# Patient Record
Sex: Female | Born: 1970 | Race: White | Hispanic: No | Marital: Married | Smoking: Never smoker
Health system: Southern US, Community
[De-identification: ages and names within clinical notes are randomized; demographics above are authoritative.]

## PROBLEM LIST (undated history)

## (undated) DIAGNOSIS — M1990 Unspecified osteoarthritis, unspecified site: Secondary | ICD-10-CM

## (undated) DIAGNOSIS — E559 Vitamin D deficiency, unspecified: Secondary | ICD-10-CM

## (undated) DIAGNOSIS — F5104 Psychophysiologic insomnia: Secondary | ICD-10-CM

## (undated) DIAGNOSIS — R931 Abnormal findings on diagnostic imaging of heart and coronary circulation: Secondary | ICD-10-CM

## (undated) DIAGNOSIS — E039 Hypothyroidism, unspecified: Secondary | ICD-10-CM

## (undated) DIAGNOSIS — T7840XA Allergy, unspecified, initial encounter: Secondary | ICD-10-CM

## (undated) DIAGNOSIS — F4310 Post-traumatic stress disorder, unspecified: Secondary | ICD-10-CM

## (undated) DIAGNOSIS — R51 Headache: Secondary | ICD-10-CM

## (undated) DIAGNOSIS — F32A Depression, unspecified: Secondary | ICD-10-CM

## (undated) HISTORY — DX: Post-traumatic stress disorder, unspecified: F43.10

## (undated) HISTORY — DX: Depression, unspecified: F32.A

## (undated) HISTORY — DX: Hypothyroidism, unspecified: E03.9

## (undated) HISTORY — DX: Unspecified osteoarthritis, unspecified site: M19.90

## (undated) HISTORY — PX: REFRACTIVE SURGERY: SHX103

## (undated) HISTORY — DX: Allergy, unspecified, initial encounter: T78.40XA

## (undated) HISTORY — DX: Headache: R51

## (undated) HISTORY — DX: Psychophysiologic insomnia: F51.04

## (undated) HISTORY — PX: WISDOM TOOTH EXTRACTION: SHX21

## (undated) HISTORY — DX: Vitamin D deficiency, unspecified: E55.9

---

## 2005-02-25 ENCOUNTER — Encounter: Admission: RE | Admit: 2005-02-25 | Discharge: 2005-02-25 | Payer: Self-pay | Admitting: *Deleted

## 2005-12-28 ENCOUNTER — Encounter: Admission: RE | Admit: 2005-12-28 | Discharge: 2005-12-28 | Payer: Self-pay | Admitting: *Deleted

## 2006-08-07 ENCOUNTER — Encounter: Payer: Self-pay | Admitting: Internal Medicine

## 2007-06-28 ENCOUNTER — Ambulatory Visit: Payer: Self-pay | Admitting: Internal Medicine

## 2007-07-23 ENCOUNTER — Ambulatory Visit: Payer: Self-pay | Admitting: Internal Medicine

## 2007-07-25 ENCOUNTER — Encounter: Payer: Self-pay | Admitting: Internal Medicine

## 2007-07-25 DIAGNOSIS — G4700 Insomnia, unspecified: Secondary | ICD-10-CM

## 2007-07-25 DIAGNOSIS — R519 Headache, unspecified: Secondary | ICD-10-CM | POA: Insufficient documentation

## 2007-07-25 DIAGNOSIS — F4310 Post-traumatic stress disorder, unspecified: Secondary | ICD-10-CM | POA: Insufficient documentation

## 2007-07-25 DIAGNOSIS — IMO0002 Reserved for concepts with insufficient information to code with codable children: Secondary | ICD-10-CM

## 2007-07-25 DIAGNOSIS — R51 Headache: Secondary | ICD-10-CM

## 2007-10-16 ENCOUNTER — Ambulatory Visit: Payer: Self-pay | Admitting: Internal Medicine

## 2007-10-16 DIAGNOSIS — J069 Acute upper respiratory infection, unspecified: Secondary | ICD-10-CM | POA: Insufficient documentation

## 2007-11-14 ENCOUNTER — Ambulatory Visit: Payer: Self-pay | Admitting: *Deleted

## 2007-11-21 ENCOUNTER — Ambulatory Visit: Payer: Self-pay | Admitting: *Deleted

## 2007-11-28 ENCOUNTER — Ambulatory Visit: Payer: Self-pay | Admitting: *Deleted

## 2007-12-12 ENCOUNTER — Ambulatory Visit: Payer: Self-pay | Admitting: *Deleted

## 2008-08-08 ENCOUNTER — Inpatient Hospital Stay (HOSPITAL_COMMUNITY): Admission: AD | Admit: 2008-08-08 | Discharge: 2008-08-10 | Payer: Self-pay | Admitting: Obstetrics

## 2008-11-07 HISTORY — PX: REFRACTIVE SURGERY: SHX103

## 2008-12-16 ENCOUNTER — Ambulatory Visit: Payer: Self-pay | Admitting: Internal Medicine

## 2008-12-16 DIAGNOSIS — J45998 Other asthma: Secondary | ICD-10-CM | POA: Insufficient documentation

## 2008-12-16 DIAGNOSIS — J019 Acute sinusitis, unspecified: Secondary | ICD-10-CM | POA: Insufficient documentation

## 2009-03-30 ENCOUNTER — Telehealth (INDEPENDENT_AMBULATORY_CARE_PROVIDER_SITE_OTHER): Payer: Self-pay | Admitting: *Deleted

## 2009-04-07 ENCOUNTER — Encounter: Payer: Self-pay | Admitting: Internal Medicine

## 2009-06-18 ENCOUNTER — Telehealth: Payer: Self-pay | Admitting: Internal Medicine

## 2009-08-13 ENCOUNTER — Telehealth: Payer: Self-pay | Admitting: Internal Medicine

## 2009-09-12 ENCOUNTER — Telehealth (INDEPENDENT_AMBULATORY_CARE_PROVIDER_SITE_OTHER): Payer: Self-pay | Admitting: *Deleted

## 2009-09-18 ENCOUNTER — Telehealth: Payer: Self-pay | Admitting: Internal Medicine

## 2009-10-06 ENCOUNTER — Ambulatory Visit: Payer: Self-pay | Admitting: Internal Medicine

## 2009-10-06 DIAGNOSIS — M79609 Pain in unspecified limb: Secondary | ICD-10-CM

## 2009-10-07 ENCOUNTER — Ambulatory Visit: Payer: Self-pay | Admitting: Internal Medicine

## 2009-12-21 ENCOUNTER — Telehealth: Payer: Self-pay | Admitting: Internal Medicine

## 2010-01-02 ENCOUNTER — Ambulatory Visit: Payer: Self-pay | Admitting: Internal Medicine

## 2010-01-02 DIAGNOSIS — J040 Acute laryngitis: Secondary | ICD-10-CM | POA: Insufficient documentation

## 2010-01-04 ENCOUNTER — Telehealth: Payer: Self-pay | Admitting: Internal Medicine

## 2010-01-14 ENCOUNTER — Telehealth (INDEPENDENT_AMBULATORY_CARE_PROVIDER_SITE_OTHER): Payer: Self-pay | Admitting: *Deleted

## 2010-03-01 ENCOUNTER — Ambulatory Visit: Payer: Self-pay | Admitting: Internal Medicine

## 2010-03-01 DIAGNOSIS — R0989 Other specified symptoms and signs involving the circulatory and respiratory systems: Secondary | ICD-10-CM

## 2010-03-01 DIAGNOSIS — J302 Other seasonal allergic rhinitis: Secondary | ICD-10-CM

## 2010-03-01 DIAGNOSIS — R0609 Other forms of dyspnea: Secondary | ICD-10-CM

## 2010-03-01 DIAGNOSIS — J3089 Other allergic rhinitis: Secondary | ICD-10-CM

## 2010-03-05 ENCOUNTER — Telehealth: Payer: Self-pay | Admitting: Internal Medicine

## 2010-03-11 ENCOUNTER — Telehealth: Payer: Self-pay | Admitting: Internal Medicine

## 2010-03-19 ENCOUNTER — Telehealth: Payer: Self-pay | Admitting: Internal Medicine

## 2010-05-19 ENCOUNTER — Telehealth: Payer: Self-pay | Admitting: Internal Medicine

## 2010-05-21 ENCOUNTER — Encounter: Payer: Self-pay | Admitting: Internal Medicine

## 2010-06-17 ENCOUNTER — Telehealth: Payer: Self-pay | Admitting: Internal Medicine

## 2010-07-02 ENCOUNTER — Telehealth: Payer: Self-pay | Admitting: Internal Medicine

## 2010-07-19 ENCOUNTER — Telehealth: Payer: Self-pay | Admitting: Internal Medicine

## 2010-09-18 ENCOUNTER — Ambulatory Visit: Payer: Self-pay | Admitting: Family Medicine

## 2010-09-20 ENCOUNTER — Telehealth: Payer: Self-pay | Admitting: Internal Medicine

## 2010-11-15 ENCOUNTER — Telehealth: Payer: Self-pay | Admitting: Internal Medicine

## 2010-11-27 ENCOUNTER — Encounter: Payer: Self-pay | Admitting: *Deleted

## 2010-12-06 ENCOUNTER — Telehealth (INDEPENDENT_AMBULATORY_CARE_PROVIDER_SITE_OTHER): Payer: Self-pay | Admitting: *Deleted

## 2010-12-07 NOTE — Progress Notes (Signed)
  Phone Note Call from Patient Call back at Home Phone [phone]   Caller: Patient Call For: [name] Summary of Call: Pt has been exposed to shingles & hep b. Pt wants to know if she should go ahead and get the shingles shot & hep b shot? Please advise. Initial call taken by: Verdell Face,  July 02, 2010 11:51 AM  Follow-up for Phone Call        Per pt, her Mother has had shingles outbreak x 1 week. Pt has had limited contact (hugs, spending time in the same room with pt) but is concerned of exposure. Pt's father has tested pos for Heb B x 2 years (contact same). I advised pt that I would request MD's advisement due to her level of concern. Please advise? Follow-up by: Margaret Pyle, CMA,  July 02, 2010 11:59 AM  Additional Follow-up for Phone Call Additional follow up Details #1::        no need for shingles shot based on this history, especially if she has already had chickenpox in the past (a blood test could be done to prove this)  OK the hepB series , but remember this is technically an STD, only able to be contracted sexually - does she really  think this could be true? if not, perhaps a blood test could be done to prove seronegativity, and IF she so desires, she could have the Hep B series Additional Follow-up by: Corwin Levins MD,  July 02, 2010 1:19 PM    Additional Follow-up for Phone Call Additional follow up Details #2::    Pt informed and declines blood test at this time Follow-up by: Margaret Pyle, CMA,  July 02, 2010 2:08 PM

## 2010-12-07 NOTE — Progress Notes (Signed)
Summary: call report  Phone Note Other Incoming   Caller: [facility] Call Report Summary of Call: Triage Call Report Triage Record Num: [id number] Operator: Geanie Berlin Patient Name: Laurie Solomon Call Date & Time: 01/02/2010 11:04:21AM Patient Phone: [phone] PCP: Oliver Barre Patient Gender: Female PCP Fax : [phone] Patient DOB: Mar 15, 1971 [facility] Name[facility] Reason for Call: EMERGENT CALL: 40 yo Calling for "bad" cold, cough and intermittent sore throat. Onset: 12/26/09. Afebrile. Declined traige. Transferred to office/[place] for appt. Protocol(s) Used: PCP Calls, No Triage (Adult) Recommended Outcome per Protocol: Call Provider within 72 Hours Override Outcome if Used in Protocol: See Provider Immediately RN Reason for Override Outcome: Office Is Open. Reason for Outcome: Caller requesting an appointment, triage offered and declined Care Advice:  ~  Initial call taken by: Margaret Pyle, CMA,  January 04, 2010 8:08 AM  Follow-up for Phone Call        noted -  Follow-up by: Corwin Levins MD,  January 04, 2010 9:59 AM

## 2010-12-07 NOTE — Progress Notes (Signed)
Summary: Rx change?  Phone Note Call from Patient Call back at [facility] Phone [phone]   Caller: Patient Summary of Call: Pt called requesting to have Ambien changed back to Klonopin for as needed sleep. Pt states Klonopin worked much better at keeping her asleep longer. Please advise? Initial call taken by: Margaret Pyle, CMA,  July 19, 2010 1:35 PM  Follow-up for Phone Call        ok  - done hardcopy to LIM side B - dahlia  Follow-up by: Corwin Levins MD,  July 19, 2010 5:26 PM  Additional Follow-up for Phone Call Additional follow up Details #1::        Notified pt rx ready for pick-up. Put in cabinet up front Additional Follow-up by: Orlan Leavens RMA,  July 20, 2010 9:00 AM    New/Updated Medications: CLONAZEPAM 1 MG TABS (CLONAZEPAM) 1po at bedtime as needed Prescriptions: CLONAZEPAM 1 MG TABS (CLONAZEPAM) 1po at bedtime as needed  #30 x 3   Entered and Authorized by:   Corwin Levins MD   Signed by:   Corwin Levins MD on 07/19/2010   Method used:   Print then Give to Patient   RxID:   [id number]

## 2010-12-07 NOTE — Progress Notes (Signed)
Summary: AMBIEN  Phone Note Call from Patient Call back at Home Phone [phone]   Summary of Call: Pt is packing to leave for the weekend and can not find her ambien. She thinks she may have thrown medication away. Patient is requesting rx to be called in to pharmacy. Please advise.  Initial call taken by: Lamar Sprinkles, CMA,  Mar 19, 2010 12:29 PM  Follow-up for Phone Call        remember this was changed apr 25 to the clonazepam;  I would cont the clonazapem alone for now Follow-up by: Corwin Levins MD,  Mar 19, 2010 2:53 PM  Additional Follow-up for Phone Call Additional follow up Details #1::        Left message for pt to return my call. Additional Follow-up by: Josph Macho RMA,  Mar 19, 2010 3:08 PM    Additional Follow-up for Phone Call Additional follow up Details #2::    Patient left message on triage that spouse found her Ambien. No longer needed. Thanks. Follow-up by: Lucious Groves,  Mar 19, 2010 3:55 PM  Additional Follow-up for Phone Call Additional follow up Details #3:: Details for Additional Follow-up Action Taken: noted Additional Follow-up by: Corwin Levins MD,  Mar 19, 2010 4:46 PM

## 2010-12-07 NOTE — Progress Notes (Signed)
Summary: cough syrup  Phone Note Call from Patient Call back at Home Phone [phone]   Caller: Patient Summary of Call: pt called stating that she still has severe cough. pt is requesting cough syrup. Initial call taken by: Margaret Pyle, CMA,  March 05, 2010 3:39 PM  Follow-up for Phone Call        [facility] [address] per pt request Follow-up by: Margaret Pyle, CMA,  March 05, 2010 4:39 PM    Prescriptions: HYDROMET 5-1.5 MG/5ML SYRP (HYDROCODONE-HOMATROPINE) 1-2 tsp by mouth q 4-6 hour as needed cough  #6oz x 1   Entered and Authorized by:   Corwin Levins MD   Signed by:   Corwin Levins MD on 03/05/2010   Method used:   Print then Give to Patient   RxID:   [id number]  done hardcopy to LIM side B - dahlia  Corwin Levins MD  March 05, 2010 4:35 PM

## 2010-12-07 NOTE — Assessment & Plan Note (Signed)
Summary: SORE THROAT,COUGH,COLD/PN   Vital Signs:  Patient profile:   40 year old female Weight:      173 pounds BMI:     27.19 O2 Sat:      98 % Temp:     97.2 degrees F Pulse rate:   88 / minute BP sitting:   140 / 92  Vitals Entered By: Lamar Sprinkles, CMA (January 02, 2010 1:56 PM) CC: Pt c/o sore throat, nonproductive cough since monday. Has taken claritin and sudafed.    History of Present Illness: Laurie Solomon [facility]   5 days of above symptom with hoarseness   and ur congestion.   has small child at home and well,Hx of asthma but no recent flares.  Some  sinus drainage  but no fever cp sob .  Hard to talk currently.    On antihistamine and singulair   out of inhaler .     Preventive Screening-Counseling & Management  Alcohol-Tobacco     Smoking Status: never  Current Medications (verified): 1)  Zolpidem Tartrate 10 Mg Tabs (Zolpidem Tartrate) .Marland Kitchen.. 1 By Mouth At Bedtime As Needed 2)  Alprazolam 0.25 Mg Tabs (Alprazolam) .Marland Kitchen.. 1po Once Daily As Needed - To Fill Dec 21, 2009 3)  Singulair 10 Mg Tabs (Montelukast Sodium) .Marland Kitchen.. 1 By Mouth Once Daily 4)  Phentermine Hcl 37.5 Mg Tabs (Phentermine Hcl) .Marland Kitchen.. 1 By Mouth Once Daily  Allergies: No Known Drug Allergies  Past History:  Past medical, surgical, family and social histories (including risk factors) reviewed for relevance to current acute and chronic problems.  Past Medical History: Reviewed history from 12/16/2008 and no changes required. Chronic Insomnia Hx of Post Traumatic Stress Disorder Headache Asthma - childhood  Past Surgical History: Reviewed history from 07/25/2007 and no changes required. Denies surgical history  Family History: Reviewed history from 12/16/2008 and no changes required. m-grandfather with heart disease, HTN p-grandfather with DM maternal cousin  and great -aunt with breast cancer  Social History: Reviewed history from 12/16/2008 and no changes  required. work - part Curator Alcohol use-no Married 1 son  18 mos   Never Smoked  Review of Systems       The patient complains of hoarseness.  The patient denies anorexia, fever, weight loss, weight gain, chest pain, syncope, dyspnea on exertion, prolonged cough, headaches, enlarged lymph nodes, and angioedema.         no v d   Physical Exam  General:  hoarse in nad  Head:  normocephalic and atraumatic.   Eyes:  vision grossly intact, pupils equal, and pupils round.   Ears:  R ear normal and L ear normal.   Nose:  mild congestion Mouth:  mild erythema no edema or leaions Neck:  No deformities, masses, or tenderness noted. Lungs:  Normal respiratory effort, chest expands symmetrically. Lungs are clear to auscultation, no crackles or wheezes.no dullness.   Heart:  Normal rate and regular rhythm. S1 and S2 normal without gallop, murmur, click, rub or other extra sounds. Pulses:  nl cap refill  Extremities:  no clubbing cyanosis or edema  Neurologic:  non focal  Skin:  turgor normal, color normal, no ecchymoses, and no petechiae.   Cervical Nodes:  shoddy nodes  Psych:  Oriented X3, good eye contact, not anxious appearing, and not depressed appearing.     Impression & Recommendations:  Problem # 1:  LARYNGITIS, ACUTE (ICD-464.00) viral  rx  Expectant management  and monitor for alarm.sx   Problem # 2:  ASTHMA (ICD-493.90) seems stable  at present but get  resucue med if needed    The following medications were removed from the medication list:    Prednisone 10 Mg Tabs (Prednisone) .Marland KitchenMarland KitchenMarland KitchenMarland Kitchen 4po qd for 3days, then 3po qd for 3days, then 2po qd for 3days, then 1po qd for 3 days, then stop Her updated medication list for this problem includes:    Singulair 10 Mg Tabs (Montelukast sodium) .Marland Kitchen... 1 by mouth once daily    Ventolin Hfa 108 (90 Base) Mcg/act Aers (Albuterol sulfate) .Marland Kitchen... 1-2 puffs q id as needed wheezing  Complete Medication List: 1)  Zolpidem Tartrate 10 Mg  Tabs (Zolpidem tartrate) .Marland Kitchen.. 1 by mouth at bedtime as needed 2)  Alprazolam 0.25 Mg Tabs (Alprazolam) .Marland Kitchen.. 1po once daily as needed - to fill Dec 21, 2009 3)  Singulair 10 Mg Tabs (Montelukast sodium) .Marland Kitchen.. 1 by mouth once daily 4)  Phentermine Hcl 37.5 Mg Tabs (Phentermine hcl) .Marland Kitchen.. 1 by mouth once daily 5)  Hydromet 5-1.5 Mg/59ml Syrp (Hydrocodone-homatropine) .Marland Kitchen.. 1-2 tsp by mouth q 4-6 hour as needed cough 6)  Ventolin Hfa 108 (90 Base) Mcg/act Aers (Albuterol sulfate) .Marland Kitchen.. 1-2 puffs q id as needed wheezing  Patient Instructions: 1)  i think this is a viral respiratory infection. 2)  symptom treatment as we discussed. cough could get worse before better. 3)  call if fever shortness of breath  etc. expect symptom for another week or so Prescriptions: VENTOLIN HFA 108 (90 BASE) MCG/ACT AERS (ALBUTEROL SULFATE) 1-2 puffs q id as needed wheezing  #1 x 0   Entered and Authorized by:   Madelin Headings MD   Signed by:   Madelin Headings MD on 01/02/2010   Method used:   [facility]  [address] [phone]* (retail)       [address]       [place], [place]  [id number]       Ph: [phone] or [phone]       Fax: [phone]   RxID:   [id number] HYDROMET 5-1.5 MG/5ML SYRP (HYDROCODONE-HOMATROPINE) 1-2 tsp by mouth q 4-6 hour as needed cough  #6 oz x 0   Entered and Authorized by:   Madelin Headings MD   Signed by:   Madelin Headings MD on 01/02/2010   Method used:   Print then Give to Patient   RxID:   [id number]

## 2010-12-07 NOTE — Progress Notes (Signed)
Summary: Rx refill req  Phone Note Call from Patient Call back at Home Phone [phone]   Caller: Patient Summary of Call: Pt called requesting refilll od Ambien. Pt requested medication 03/2010 and was denied because it was changed to Clonazepam but pt states that she did not use the Clonazepam because it did not help. Pt uses Ambien and is requesting refill. Please advise. Initial call taken by: Margaret Pyle, CMA,  June 17, 2010 4:30 PM  Follow-up for Phone Call        [facility] [address] Rx faxed to pharmacy Follow-up by: Margaret Pyle, CMA,  June 18, 2010 2:53 PM    New/Updated Medications: ZOLPIDEM TARTRATE 10 MG TABS (ZOLPIDEM TARTRATE) 1 by mouth at bedtime as needed Prescriptions: ZOLPIDEM TARTRATE 10 MG TABS (ZOLPIDEM TARTRATE) 1 by mouth at bedtime as needed  #30 x 5   Entered and Authorized by:   Corwin Levins MD   Signed by:   Corwin Levins MD on 06/17/2010   Method used:   Print then Give to Patient   RxID:   [id number]  done hardcopy to LIM side B - dahlia  Corwin Levins MD  June 17, 2010 5:55 PM
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
## 2010-12-07 NOTE — Progress Notes (Signed)
Summary: Call Report  Phone Note Other Incoming   Caller: [facility] Summary of Call: [facility] Triage Call Report Triage Record Num: [id number] Operator: Di Kindle Patient Name: Laurie Solomon Call Date & Time: 09/18/2010 10:14:05AM Patient Phone: [phone] PCP: Oliver Barre Patient Gender: Female PCP Fax : [phone] Patient DOB: 1971/04/05 [facility] Name[facility] Reason for Call: Pt calling, with symptoms of URI for 1 week, afebrile: sore throat, not improving with asthma meds, Guideline: URI: caller to office for appt as requested. Protocol(s) Used: Upper Respiratory Infection (URI) Recommended Outcome per Protocol: See Provider within 24 hours Reason for Outcome: Mild to moderate headache for more than 24 hours unrelieved with nonprescription medications Care Advice: Call provider if headache worsens, develops temperature greater than 101.30F (38.1C) or any temperature elevation in a geriatric or immunocompromised patient (such as diabetes, HIV/AIDS, chemotherapy, organ transplant, or chronic steroid use).  ~  ~ SYMPTOM / CONDITION MANAGEMENT 09/18/2010 10:22:16AM Page 1 of 1 CAN Initial call taken by: Margaret Pyle, CMA,  September 20, 2010 8:04 AM

## 2010-12-07 NOTE — Progress Notes (Signed)
Summary: Med Refill  Phone Note Refill Request  on December 21, 2009 10:19 AM  Refills Requested: Medication #1:  ALPRAZOLAM 0.25 MG TABS 1po once daily as needed - needs return office visit for further refills   Dosage confirmed as above?Dosage Confirmed   [facility] [address] [phone] Initial call taken by: Scharlene Gloss,  December 21, 2009 10:19 AM  Follow-up for Phone Call        done hardcopy to LIM side B - dahlia  Follow-up by: Corwin Levins MD,  December 21, 2009 12:52 PM  Additional Follow-up for Phone Call Additional follow up Details #1::        rx faxed to pharmacy Additional Follow-up by: Margaret Pyle, CMA,  December 21, 2009 1:12 PM

## 2010-12-07 NOTE — Assessment & Plan Note (Signed)
Summary: UPPER RESP INF   Vital Signs:  Patient profile:   40 year old female Weight:      189 pounds BMI:     29.71 Temp:     98.2 degrees F Pulse rate:   73 / minute BP sitting:   118 / 80  (left arm) Cuff size:   regular  Vitals Entered By: Lamar Sprinkles, CMA (September 18, 2010 11:33 AM) CC: sinus congestion, drainage, sore throat & productive cough   History of Present Illness: 40 yo here for URI symptoms.  Runny nose, sinus pressure.  Body aches, subjective fever a few days ago. Cough is productive in the morning. No SOB or CP No wheezing.  Sinus pressure became so severe three days ago, starting taking left over Prednisone she has at home, has taken 30 mg daily for past 3 days. Not helping much. Also taking Claritin and Singular which have not helped much either.    Current Medications (verified): 1)  Alprazolam 0.25 Mg Tabs (Alprazolam) .Marland Kitchen.. 1po Once Daily As Needed - To Fill May 19, 2010 2)  Singulair 10 Mg Tabs (Montelukast Sodium) .Marland Kitchen.. 1 By Mouth Once Daily 3)  Hydromet 5-1.5 Mg/26ml Syrp (Hydrocodone-Homatropine) .Marland Kitchen.. 1-2 Tsp By Mouth Q 4-6 Hour As Needed Cough 4)  Ventolin Hfa 108 (90 Base) Mcg/act Aers (Albuterol Sulfate) .Marland Kitchen.. 1-2 Puffs Q Id As Needed Wheezing 5)  Fluticasone Propionate 50 Mcg/act Susp (Fluticasone Propionate) .... 2 Spray/side Once Daily 6)  Clonazepam 1 Mg Tabs (Clonazepam) .Marland Kitchen.. 1po At Bedtime As Needed 7)  Amoxicillin 500 Mg Tabs (Amoxicillin) .Marland Kitchen.. 1 Tab By Mouth Two Times A Day X 10 Days 8)  Sertraline Hcl 100 Mg Tabs (Sertraline Hcl) .Marland Kitchen.. 1 By Mouth Daily  Allergies (verified): No Known Drug Allergies  Review of Systems      See HPI General:  Complains of fever. ENT:  Complains of ringing in ears; denies sore throat. CV:  Denies chest pain or discomfort. Resp:  Complains of cough and sputum productive; denies shortness of breath and wheezing.  Physical Exam  General:  alert and overweight-appearing. , VSS, non toxic  appearing.  Nose:  nasal dischargemucosal pallor and mucosal edema. sinsues TTP throughout   Mouth:  pharyngeal erythema and fair dentition.   Lungs:  normal respiratory effort and normal breath sounds.   Heart:  normal rate and regular rhythm.   Extremities:  no edema, no erythema  Psych:  Oriented X3, good eye contact, not anxious appearing, and not depressed appearing.     Impression & Recommendations:  Problem # 1:  SINUSITIS- ACUTE-NOS (ICD-461.9) Assessment New Given duration and progression of symptoms, will treat for bacterial sinusitis. Advised stopping the prednsione. See pt instructions for details. Her updated medication list for this problem includes:    Hydromet 5-1.5 Mg/21ml Syrp (Hydrocodone-homatropine) .Marland Kitchen... 1-2 tsp by mouth q 4-6 hour as needed cough    Fluticasone Propionate 50 Mcg/act Susp (Fluticasone propionate) .Marland Kitchen... 2 spray/side once daily    Amoxicillin 500 Mg Tabs (Amoxicillin) .Marland Kitchen... 1 tab by mouth two times a day x 10 days  Complete Medication List: 1)  Alprazolam 0.25 Mg Tabs (Alprazolam) .Marland Kitchen.. 1po once daily as needed - to fill May 19, 2010 2)  Singulair 10 Mg Tabs (Montelukast sodium) .Marland Kitchen.. 1 by mouth once daily 3)  Hydromet 5-1.5 Mg/47ml Syrp (Hydrocodone-homatropine) .Marland Kitchen.. 1-2 tsp by mouth q 4-6 hour as needed cough 4)  Ventolin Hfa 108 (90 Base) Mcg/act Aers (Albuterol sulfate) .Marland Kitchen.. 1-2 puffs q id  as needed wheezing 5)  Fluticasone Propionate 50 Mcg/act Susp (Fluticasone propionate) .... 2 spray/side once daily 6)  Clonazepam 1 Mg Tabs (Clonazepam) .Marland Kitchen.. 1po at bedtime as needed 7)  Amoxicillin 500 Mg Tabs (Amoxicillin) .Marland Kitchen.. 1 tab by mouth two times a day x 10 days 8)  Sertraline Hcl 100 Mg Tabs (Sertraline hcl) .Marland Kitchen.. 1 by mouth daily  Patient Instructions: 1)  Take antibiotic as directed.  Please stop taking your prednsione. 2)  Drink lots of fluids.  Treat sympotmatically with Mucinex, nasal saline irrigation, and Tylenol/Ibuprofen. Also try claritin D  or zyrtec D over the counter- two times a day as needed ( have to sign for them at pharmacy). You can use warm compresses.  Cough suppressant at night. Call if not improving as expected in 5-7 days.  Prescriptions: SERTRALINE HCL 100 MG TABS (SERTRALINE HCL) 1 by mouth daily  #90 x 3   Entered and Authorized by:   Ruthe Mannan MD   Signed by:   Ruthe Mannan MD on 09/18/2010   Method used:   Historical   RxID:   [id number] AMOXICILLIN 500 MG TABS (AMOXICILLIN) 1 tab by mouth two times a day x 10 days  #20 x 0   Entered and Authorized by:   Ruthe Mannan MD   Signed by:   Ruthe Mannan MD on 09/18/2010   Method used:   Electronically to        [facility]. [phone]* (retail)       [address]       [place], [place]  [address]       Ph: [phone]       Fax: [phone]   RxID:   [id number]    Orders Added: 1)  Est. Patient Level III [56387]

## 2010-12-07 NOTE — Progress Notes (Signed)
Summary: Medication Refill  Phone Note Refill Request Message from:  Fax from Pharmacy on May 19, 2010 2:41 PM  Refills Requested: Medication #1:  ALPRAZOLAM 0.25 MG TABS 1po once daily as needed - to fill feb 14   Dosage confirmed as above?Dosage Confirmed   Last Refilled: 12/21/2009   Notes: [facility] [address] [phone] Initial call taken by: Zella Ball Ewing CMA Duncan Dull),  May 19, 2010 2:42 PM  Follow-up for Phone Call        Rx faxed to pharmacy Follow-up by: Margaret Pyle, CMA,  May 20, 2010 8:11 AM    New/Updated Medications: ALPRAZOLAM 0.25 MG TABS (ALPRAZOLAM) 1po once daily as needed - to fill May 19, 2010 Prescriptions: ALPRAZOLAM 0.25 MG TABS (ALPRAZOLAM) 1po once daily as needed - to fill May 19, 2010  #30 x 3   Entered and Authorized by:   Corwin Levins MD   Signed by:   Corwin Levins MD on 05/19/2010   Method used:   Print then Give to Patient   RxID:   [id number]  done hardcopy to LIM side B - dahlia Corwin Levins MD  May 19, 2010 4:49 PM

## 2010-12-07 NOTE — Progress Notes (Signed)
Summary: ABX?  Phone Note Call from Patient Call back at Home Phone [phone]   Caller: Patient Summary of Call: pt called stating that she is having teeth and ear pain and is requesting refill of Zpack. Please advise Initial call taken by: Margaret Pyle, CMA,  Mar 11, 2010 3:56 PM  Follow-up for Phone Call        pt informed via VM Follow-up by: Margaret Pyle, CMA,  Mar 11, 2010 4:22 PM    Prescriptions: AZITHROMYCIN 250 MG TABS (AZITHROMYCIN) 2po qd for 1 day, then 1po qd for 4days, then stop  #6 x 0   Entered and Authorized by:   Corwin Levins MD   Signed by:   Corwin Levins MD on 03/11/2010   Method used:   Electronically to        [facility]. [phone]* (retail)       [address]       [place], [place]  [address]       Ph: [phone]       Fax: [phone]   RxID:   [id number]  done escript Corwin Levins MD  Mar 11, 2010 4:07 PM

## 2010-12-07 NOTE — Progress Notes (Signed)
  Phone Note Refill Request  on January 14, 2010 10:05 AM  Refills Requested: Medication #1:  PHENTERMINE HCL 37.5 MG TABS 1 by mouth once daily   Dosage confirmed as above?Dosage Confirmed   Notes: [facility] [address] [phone] Initial call taken by: Scharlene Gloss,  January 14, 2010 10:06 AM  Follow-up for Phone Call        sorry, no refills as I believe d/w pt previously;  this is not a long term medication Follow-up by: Corwin Levins MD,  January 14, 2010 12:00 PM

## 2010-12-07 NOTE — Consult Note (Signed)
Summary: [facility]  [facility]   Imported By: Sherian Rein 05/31/2010 11:56:45  _____________________________________________________________________  External Attachment:    Type:   Image     Comment:   External Document

## 2010-12-07 NOTE — Progress Notes (Signed)
Summary: Med Refill  Phone Note Refill Request  on December 21, 2009 10:18 AM  Refills Requested: Medication #1:  ZOLPIDEM TARTRATE 10 MG TABS 1 by mouth at bedtime as needed   Dosage confirmed as above?Dosage Confirmed   Notes[facility] [address] [phone] Initial call taken by: Scharlene Gloss,  December 21, 2009 10:18 AM  Follow-up for Phone Call        done hardcopy to LIM side B - dahlia  Follow-up by: Corwin Levins MD,  December 21, 2009 12:52 PM  Additional Follow-up for Phone Call Additional follow up Details #1::        rx faxed to pharmacy Additional Follow-up by: Margaret Pyle, CMA,  December 21, 2009 1:11 PM    New/Updated Medications: ZOLPIDEM TARTRATE 10 MG TABS (ZOLPIDEM TARTRATE) 1 by mouth at bedtime as needed ALPRAZOLAM 0.25 MG TABS (ALPRAZOLAM) 1po once daily as needed - to fill Dec 21, 2009 Prescriptions: ZOLPIDEM TARTRATE 10 MG TABS (ZOLPIDEM TARTRATE) 1 by mouth at bedtime as needed  #30 x 5   Entered and Authorized by:   Corwin Levins MD   Signed by:   Corwin Levins MD on 12/21/2009   Method used:   Print then Give to Patient   RxID:   [id number] ALPRAZOLAM 0.25 MG TABS (ALPRAZOLAM) 1po once daily as needed - to fill Dec 21, 2009  #30 x 2   Entered and Authorized by:   Corwin Levins MD   Signed by:   Corwin Levins MD on 12/21/2009   Method used:   Print then Give to Patient   RxID:   [id number]

## 2010-12-09 NOTE — Progress Notes (Signed)
Summary: medication refill  Phone Note Refill Request Message from:  Fax from Pharmacy on November 15, 2010 11:27 AM  Refills Requested: Medication #1:  CLONAZEPAM 1 MG TABS 1po at bedtime as needed   Dosage confirmed as above?Dosage Confirmed   Last Refilled: 07/19/2010   [facility] [address] [phone] Initial call taken by: Zella Ball Ewing CMA Duncan Dull),  November 15, 2010 11:27 AM  Follow-up for Phone Call        faxed hardcopy to pharmacy Follow-up by: Robin Ewing CMA Duncan Dull),  November 15, 2010 1:22 PM    Prescriptions: CLONAZEPAM 1 MG TABS (CLONAZEPAM) 1po at bedtime as needed  #30 x 3   Entered and Authorized by:   Corwin Levins MD   Signed by:   Corwin Levins MD on 11/15/2010   Method used:   Print then Give to Patient   RxID:   [id number]   done hardcopy to LIM side B - dahlia Corwin Levins MD  November 15, 2010 12:37 PM

## 2010-12-15 NOTE — Progress Notes (Signed)
Summary: allergy shots-talked with pt.   Phone Note Call from Patient Call back at Home Phone [phone]   Caller: Patient Summary of Call: Pt wants to know if she can start receiving her allergy shots immediately before she schedules an allergy consult pls advise. Initial call taken by: Darletta Moll,  December 06, 2010 12:34 PM  Follow-up for Phone Call        [facility] Vernie Murders  December 06, 2010 2:47 PM  Spoke with pt.  She states that she is a pt of [name] for allergy and gets allergy shots weekly.  It would be more convienient for her to come here and have her shots.  She was advised by Dr Hiawatha Callas to call here and see if CDY would approve her having this done here. Pls advise, thanks! Follow-up by: Vernie Murders,  December 07, 2010 1:00 PM  Additional Follow-up for Phone Call Additional follow up Details #1::        OK- We need copies of records. I will need to see her once to establish a chart, in case something happens while here. . Does she want to transfer her care here, or just get [name] vaccine injected here?  Additional Follow-up by: Waymon Budge MD,  December 07, 2010 2:41 PM    Additional Follow-up for Phone Call Additional follow up Details #2::    I called pt.yesterday and left a message. I'm waiting for her to return my call.  Follow-up by: Dimas Millin,  December 08, 2010 3:51 PM  Additional Follow-up for Phone Call Additional follow up Details #3:: Details for Additional Follow-up Action Taken: Called pt. this morning she is sending her records and she will make an appt. to see you. If she has an epi-pen is it okay for her to start her shots here from [facility] it will probably be a few wks. before she can get an appt. with you ? Right now she just wants to get their shots here.   OK to get [name] allergy vaccine administered here. Be sure we are keeping clear records to share with [name] office about dates, doses, any reactions etc. Work  with patient to be sure she makes and keeps one documentation visit here so we have record of her health satusin case there is ever any problem. Be sure you follow the policy of Sharma's office on shot adminstration and response to reactions. His office has a printed instruction sheet to request if it doesn't come with her.  Additional Follow-up by: Dimas Millin,  December 10, 2010 9:46 AM  New/Updated Medications: * ALLERGY VACCINE giving [name] vac here

## 2010-12-17 ENCOUNTER — Telehealth: Payer: Self-pay | Admitting: Internal Medicine

## 2010-12-29 ENCOUNTER — Ambulatory Visit: Payer: Self-pay | Admitting: Internal Medicine

## 2010-12-29 NOTE — Progress Notes (Signed)
Summary: Med Refill  Phone Note Refill Request Message from:  Fax from Pharmacy on December 17, 2010 4:42 PM  Refills Requested: Medication #1:  ALPRAZOLAM 0.25 MG TABS 1po once daily as needed - to fill july 13   Dosage confirmed as above?Dosage Confirmed   Last Refilled: 05/19/2010   Notes: [facility] [phone] Initial call taken by: Zella Ball Ewing CMA Duncan Dull),  December 17, 2010 4:42 PM  Follow-up for Phone Call        faxed hardcopy to pharmacy Follow-up by: Robin Ewing CMA Duncan Dull),  December 20, 2010 7:55 AM    New/Updated Medications: ALPRAZOLAM 0.25 MG TABS (ALPRAZOLAM) 1po once daily as needed - to fill Dec 17, 2010 Prescriptions: ALPRAZOLAM 0.25 MG TABS (ALPRAZOLAM) 1po once daily as needed - to fill Dec 17, 2010  #30 x 2   Entered and Authorized by:   Corwin Levins MD   Signed by:   Corwin Levins MD on 12/17/2010   Method used:   Print then Give to Patient   RxID:   [id number]  done hardcopy to LIM side B - dahlia Corwin Levins MD  December 17, 2010 5:19 PM

## 2010-12-30 ENCOUNTER — Ambulatory Visit (INDEPENDENT_AMBULATORY_CARE_PROVIDER_SITE_OTHER): Payer: BC Managed Care – PPO | Admitting: Internal Medicine

## 2010-12-30 ENCOUNTER — Encounter: Payer: Self-pay | Admitting: Internal Medicine

## 2010-12-30 DIAGNOSIS — L989 Disorder of the skin and subcutaneous tissue, unspecified: Secondary | ICD-10-CM

## 2010-12-30 DIAGNOSIS — J209 Acute bronchitis, unspecified: Secondary | ICD-10-CM

## 2010-12-30 DIAGNOSIS — R062 Wheezing: Secondary | ICD-10-CM

## 2010-12-30 DIAGNOSIS — J309 Allergic rhinitis, unspecified: Secondary | ICD-10-CM

## 2010-12-30 DIAGNOSIS — E669 Obesity, unspecified: Secondary | ICD-10-CM | POA: Insufficient documentation
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
## 2011-03-09 ENCOUNTER — Encounter: Payer: Self-pay | Admitting: Internal Medicine

## 2011-03-14 ENCOUNTER — Encounter: Payer: Self-pay | Admitting: Internal Medicine

## 2011-03-14 ENCOUNTER — Ambulatory Visit (INDEPENDENT_AMBULATORY_CARE_PROVIDER_SITE_OTHER): Payer: BC Managed Care – PPO | Admitting: Internal Medicine

## 2011-03-14 VITALS — BP 130/80 | HR 87 | Ht 68.0 in | Wt 200.0 lb

## 2011-03-14 DIAGNOSIS — J45909 Unspecified asthma, uncomplicated: Secondary | ICD-10-CM

## 2011-03-14 DIAGNOSIS — J309 Allergic rhinitis, unspecified: Secondary | ICD-10-CM

## 2011-03-14 NOTE — Assessment & Plan Note (Signed)
Not currently active.  

## 2011-03-14 NOTE — Patient Instructions (Signed)
We can transfer allergy vaccine and care here. Since there have been no local reactions in a long time, we may be able to consolidate and advance her vaccine . She already has aircleaners and encasings for dust control.

## 2011-03-14 NOTE — Progress Notes (Signed)
  Subjective:    Patient ID: Laurie Solomon, female    DOB: 09/29/1971, 40 y.o.   MRN: [id number]  HPI 30 yoF 40 yo never smoker followed by Laurie Solomon for PCP and coming now asking to transfer allergy care here. She has a hx of childhood asthma, never needing more than occasional inhaler. Since teens, she has had perennial watery rhinorhea. She minimizes symptoms of congestion, headache, ear pressure, itching/ sneezing. There is no cough, wheeze or chest tightness. For control she has been on daily claritin and singulair. One year ago she began allergy vaccine at [facility]. Says allergy shots, now once weekly, have been a major improvement. Unfortunately she says [facility] is inconveniently located and regular trips here for shots would be more practical. She expressed interest in giving her own shots and I explained that is being discouraged nationally now. Triggers- house dust, dogs and cats if they lick her, mold, mildew. Her house has some dampness, 2 cats  Denies hx of intolerance to food, cosmetics, insects, aspirin, latex. No hx of allergic dermatitiis.    Review of Systems See HPI Constitutional:   No weight loss, night sweats,  Fevers, chills, fatigue, lassitude. HEENT:   No headaches,  Difficulty swallowing,  Tooth/dental problems,  Sore throat,            CV:  No chest pain,  Orthopnea, PND, swelling in lower extremities, anasarca, dizziness, palpitations  GI  No heartburn, indigestion, abdominal pain, nausea, vomiting, diarrhea, change in bowel habits, loss of appetite  Resp: No shortness of breath with exertion or at rest.  No excess mucus, no productive cough,  No non-productive cough,  No coughing up of blood.  No change in color of mucus.  No wheezing.  No chest wall deformity  Skin: no rash or lesions.  GU: no dysuria, change in color of urine, no urgency or frequency.  No flank pain.  MS:  No joint pain or swelling.  No decreased range of motion.  No back  pain.  Psych:  No change in mood or affect. No depression or anxiety.  No memory loss.      Objective:   Physical Exam General- Alert, Oriented, Affect-appropriate, Distress- none acute  Skin- rash-none, lesions- none, excoriation- none  Lymphadenopathy- none  Head- atraumatic  Eyes- Gross vision intact, PERRLA, conjunctivae clear secretions, periorbital edema  Ears- Normal-  Hearing, canals, Tm   Nose- Clear ,No- Septal dev, mucus, polyps, erosion, perforation   Throat- Mallampati II , mucosa clear , drainage- none, tonsils- atrophic  Neck- flexible , trachea midline, no stridor , thyroid nl, carotid no bruit  Chest - symmetrical excursion , unlabored     Heart/CV- RRR , no murmur , no gallop  , no rub, nl s1 s2                     - JVD- none , edema- none, stasis changes- none, varices- none     Lung- clear to P&A, wheeze- none, cough- none , dullness-none, rub- none     Chest wall-   Abd- tender-no, distended-no, bowel sounds-present, HSM- no  Br/ Gen/ Rectal- Not done, not indicated  Extrem- cyanosis- none, clubbing, none, atrophy- none, strength- nl  Neuro- grossly intact to observation         Assessment & Plan:

## 2011-03-14 NOTE — Assessment & Plan Note (Addendum)
She indicates desire to transfer care here. I explained policy and protocol, reviewing risk benefit. She has Epipen.  After discussion, I suggested she finish the last of Dr Tenny Craw vaccine here then we will remake from out stocks and continue. She may be able to combine 3 into 2 vials.

## 2011-03-18 ENCOUNTER — Encounter: Payer: Self-pay | Admitting: Internal Medicine
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
## 2011-03-25 ENCOUNTER — Ambulatory Visit (INDEPENDENT_AMBULATORY_CARE_PROVIDER_SITE_OTHER): Payer: BC Managed Care – PPO

## 2011-03-25 DIAGNOSIS — J309 Allergic rhinitis, unspecified: Secondary | ICD-10-CM

## 2011-03-29 ENCOUNTER — Other Ambulatory Visit: Payer: Self-pay

## 2011-03-29 MED ORDER — CLONAZEPAM 1 MG PO TABS: 1.0000 mg | ORAL_TABLET | Freq: Every evening | ORAL | Status: DC | PRN

## 2011-03-29 MED ORDER — ALPRAZOLAM 0.25 MG PO TABS: 0.2500 mg | ORAL_TABLET | Freq: Every day | ORAL | Status: DC | PRN

## 2011-03-29 NOTE — Telephone Encounter (Signed)
Rx faxed to pharmacy  

## 2011-04-11 ENCOUNTER — Ambulatory Visit (INDEPENDENT_AMBULATORY_CARE_PROVIDER_SITE_OTHER): Payer: BC Managed Care – PPO

## 2011-04-11 DIAGNOSIS — J309 Allergic rhinitis, unspecified: Secondary | ICD-10-CM

## 2011-04-26 ENCOUNTER — Ambulatory Visit (INDEPENDENT_AMBULATORY_CARE_PROVIDER_SITE_OTHER): Payer: BC Managed Care – PPO

## 2011-04-26 DIAGNOSIS — J309 Allergic rhinitis, unspecified: Secondary | ICD-10-CM

## 2011-04-28 ENCOUNTER — Telehealth: Payer: Self-pay

## 2011-04-28 ENCOUNTER — Telehealth: Payer: Self-pay | Admitting: Internal Medicine

## 2011-04-28 NOTE — Telephone Encounter (Signed)
Ok to refill montelukast/ Singulair 10 mg, # 30, 1 daily refill prn

## 2011-04-28 NOTE — Telephone Encounter (Signed)
Pt called stating she has been Rx'd Phentermine for weight loss but feels that using it in combination with Klonopin has slowed her weight loss. Pt is requesting to have Klonopin changed to Ambien instead, please advise.

## 2011-04-28 NOTE — Telephone Encounter (Signed)
Pt requesting singulair rx - I do not see where we have ever filled this for pt before.  Dr. Maple Hudson, pls advsie if this is ok.  Thanks!

## 2011-04-28 NOTE — Telephone Encounter (Signed)
Sorry, but I really dont feel that should be the case, as the medications should not interact in that way;  I would continue same medications for now

## 2011-04-29 MED ORDER — MONTELUKAST SODIUM 10 MG PO TABS: 10.0000 mg | ORAL_TABLET | Freq: Every day | ORAL | Status: DC

## 2011-04-29 NOTE — Telephone Encounter (Signed)
Rx sent pt aware 

## 2011-04-29 NOTE — Telephone Encounter (Signed)
Pt advised via VM, continue medications as same

## 2011-04-30 ENCOUNTER — Ambulatory Visit: Payer: BC Managed Care – PPO | Admitting: Family Medicine

## 2011-05-02 ENCOUNTER — Ambulatory Visit (INDEPENDENT_AMBULATORY_CARE_PROVIDER_SITE_OTHER): Payer: BC Managed Care – PPO

## 2011-05-02 ENCOUNTER — Encounter: Payer: Self-pay | Admitting: Internal Medicine

## 2011-05-02 ENCOUNTER — Ambulatory Visit (INDEPENDENT_AMBULATORY_CARE_PROVIDER_SITE_OTHER): Payer: BC Managed Care – PPO | Admitting: Internal Medicine

## 2011-05-02 DIAGNOSIS — Z0000 Encounter for general adult medical examination without abnormal findings: Secondary | ICD-10-CM | POA: Insufficient documentation

## 2011-05-02 DIAGNOSIS — E669 Obesity, unspecified: Secondary | ICD-10-CM

## 2011-05-02 DIAGNOSIS — J209 Acute bronchitis, unspecified: Secondary | ICD-10-CM

## 2011-05-02 DIAGNOSIS — M25562 Pain in left knee: Secondary | ICD-10-CM

## 2011-05-02 DIAGNOSIS — Z0001 Encounter for general adult medical examination with abnormal findings: Secondary | ICD-10-CM | POA: Insufficient documentation

## 2011-05-02 DIAGNOSIS — J309 Allergic rhinitis, unspecified: Secondary | ICD-10-CM

## 2011-05-02 DIAGNOSIS — M25569 Pain in unspecified knee: Secondary | ICD-10-CM

## 2011-05-02 MED ORDER — PHENTERMINE HCL 37.5 MG PO CAPS: 37.5000 mg | ORAL_CAPSULE | ORAL | Status: DC

## 2011-05-02 MED ORDER — AZITHROMYCIN 250 MG PO TABS: ORAL_TABLET | ORAL | Status: AC

## 2011-05-02 NOTE — Assessment & Plan Note (Signed)
Mild to mod, for antibx course,  to f/u any worsening symptoms or concerns 

## 2011-05-02 NOTE — Assessment & Plan Note (Signed)
Ok for one more month phenetermine, but d/w pt - not meant to be long term med

## 2011-05-02 NOTE — Patient Instructions (Addendum)
Take all new medications as prescribed Continue all other medications as before Please return in 3 mo with Lab testing done 3-5 days before  

## 2011-05-02 NOTE — Progress Notes (Signed)
Subjective:    Patient ID: Laurie Solomon, female    DOB: 11/14/1970, 40 y.o.   MRN: [id number]  HPI Here with acute onset mild to mod 2-3 days ST, HA, general weakness and malaise, with prod cough greenish sputum, but Pt denies chest pain, increased sob or doe, wheezing, orthopnea, PND, increased LE swelling, palpitations, dizziness or syncope.  Also with mild discomfort and swelling to left post knee - ? Cyst, , pain overall minor, worse to sit indian-style, better to extend.  Pt denies chest pain, increased sob or doe, wheezing, orthopnea, PND, increased LE swelling, palpitations, dizziness or syncope.  Pt denies new neurological symptoms such as new headache, or facial or extremity weakness or numbness   Pt denies polydipsia, polyuria.  Hard to lose wt, though having some success - lost 9 lbs so far with phentermine. Past Medical History  Diagnosis Date  . Chronic insomnia   . Post traumatic stress disorder   . Headache   . Asthma     childhood  . Allergic rhinitis    Past Surgical History  Procedure Date  . Refractive surgery     reports that she has never smoked. She has never used smokeless tobacco. She reports that she drinks about one ounce of alcohol per week. She reports that she does not use illicit drugs. family history includes Breast cancer in her others; Diabetes in her paternal grandfather; Heart disease in her maternal grandfather; and Hypertension in her maternal grandfather. No Known Allergies Current Outpatient Prescriptions on File Prior to Visit  Medication Sig Dispense Refill  . albuterol (VENTOLIN HFA) 108 (90 BASE) MCG/ACT inhaler Inhale 1-2 puffs into the lungs every 6 (six) hours as needed.        . ALPRAZolam (XANAX) 0.25 MG tablet Take 1 tablet (0.25 mg total) by mouth daily as needed.  30 tablet  2  . clonazePAM (KLONOPIN) 1 MG tablet Take 1 tablet (1 mg total) by mouth at bedtime as needed.  30 tablet  2  . loratadine (CLARITIN) 10 MG tablet Take 10 mg by  mouth daily.        . montelukast (SINGULAIR) 10 MG tablet Take 1 tablet (10 mg total) by mouth daily.  30 tablet  prn  . sertraline (ZOLOFT) 100 MG tablet Take 100 mg by mouth daily.        Marland Kitchen DISCONTD: phentermine 37.5 MG capsule Take 37.5 mg by mouth every morning.        . fluticasone (FLONASE) 50 MCG/ACT nasal spray 2 sprays by Nasal route daily.        Marland Kitchen HYDROcodone-homatropine (HYDROMET) 5-1.5 MG/5ML syrup 1-2 teaspoon by mouth every 4-6 hours as needed for cough        Review of Systems Review of Systems  Constitutional: Negative for diaphoresis and unexpected weight change.  HENT: Negative for drooling and tinnitus.   Eyes: Negative for photophobia and visual disturbance.  Respiratory: Negative for choking and stridor.   Gastrointestinal: Negative for vomiting and blood in stool.  Genitourinary: Negative for hematuria and decreased urine volume.  Musculoskeletal: Negative for gait problem.  Skin: Negative for color change and wound.  Neurological: Negative for tremors and numbness.  Psychiatric/Behavioral: Negative for decreased concentration. The patient is not hyperactive.       Objective:   Physical Exam BP 110/80  Pulse 77  Temp(Src) 97.8 F (36.6 C) (Oral)  Ht 5\' 8"  (1.727 m)  Wt 188 lb 6 oz (85.446 kg)  BMI 28.64 kg/m2  SpO2 98% Physical Exam  VS noted, mild ill Constitutional: Pt appears well-developed and well-nourished.  HENT: Head: Normocephalic.  Right Ear: External ear normal.  Left Ear: External ear normal.  Bilat tm's mild erythema.  Sinus nontender.  Pharynx mild erythema Eyes: Conjunctivae and EOM are normal. Pupils are equal, round, and reactive to light.  Neck: Normal range of motion. Neck supple.  Cardiovascular: Normal rate and regular rhythm.   Pulmonary/Chest: Effort normal and breath sounds normal.  Neurological: Pt is alert. No cranial nerve deficit.  Skin: Skin is warm. No erythema.  Psychiatric: Pt behavior is normal. Thought content  normal.  Left post knee with swelling - ? Cystic mass, minimal tender       Assessment & Plan:

## 2011-05-02 NOTE — Assessment & Plan Note (Signed)
Minor, ? Bakers cyst but ok to follow for now, exam o/w benign - no knee effusion , tender, or other significant abnormality, gait ok

## 2011-05-10 ENCOUNTER — Ambulatory Visit (INDEPENDENT_AMBULATORY_CARE_PROVIDER_SITE_OTHER): Payer: BC Managed Care – PPO

## 2011-05-10 DIAGNOSIS — J309 Allergic rhinitis, unspecified: Secondary | ICD-10-CM

## 2011-05-20 ENCOUNTER — Ambulatory Visit (INDEPENDENT_AMBULATORY_CARE_PROVIDER_SITE_OTHER): Payer: BC Managed Care – PPO

## 2011-05-20 ENCOUNTER — Telehealth: Payer: Self-pay | Admitting: *Deleted

## 2011-05-20 ENCOUNTER — Ambulatory Visit: Payer: BC Managed Care – PPO | Admitting: Internal Medicine

## 2011-05-20 DIAGNOSIS — J309 Allergic rhinitis, unspecified: Secondary | ICD-10-CM

## 2011-05-20 NOTE — Telephone Encounter (Signed)
Pt. Is ready to transfer from Dr. Lyla Son vac. To ours. I called to order her next set,they asked me for our address and said they'd([facility] office) send it next week. Then his office turns around and calls Mrs.Earnhardt and tells her she'll have to see Dr.Sharma before they can send her vac.Marland Kitchen She has 3 vials #1 1:100 0.5 T-G-W(trees,grasses,weeds) once a wk.,#2 1:100 0.5 D.M,DG,Cr,F(both dust mites,dog,crockroach,feathers.) once a wk.,#3 1:100 1cc cat once a month. Please advise.

## 2011-05-23 ENCOUNTER — Telehealth: Payer: Self-pay | Admitting: *Deleted

## 2011-05-23 MED ORDER — AZITHROMYCIN 250 MG PO TABS: ORAL_TABLET | ORAL | Status: AC

## 2011-05-23 NOTE — Telephone Encounter (Signed)
Pt c/o of symptoms of Bronchitis worsening over the past weekend w/sore throat & fever/99 Pt is requesting another Rx for ZPack. [last OV in Epic was 05/02/2011]?

## 2011-05-23 NOTE — Telephone Encounter (Signed)
Pt. Informed.

## 2011-05-23 NOTE — Telephone Encounter (Signed)
My thought would be to make vaccine here, starting back at 1:500  and rebuilding. If she wants to do this, please let me know and I will write for it.

## 2011-05-23 NOTE — Telephone Encounter (Signed)
Done per emr 

## 2011-05-24 NOTE — Telephone Encounter (Signed)
Called pt. She said what ever you think is fine.(Dr.Young) I told her I would call her as soon as I got the rx  And could make it up I would call her back to come in and start her new vac.Marland Kitchen

## 2011-05-26 NOTE — Telephone Encounter (Signed)
I have made script for the allergy lab. We will start her here weaker, at 1:500, 0.1 ml/ week and build per our protocols as tolerated. She should wait here 15-20 minutes after her first shot.

## 2011-06-03 ENCOUNTER — Ambulatory Visit (INDEPENDENT_AMBULATORY_CARE_PROVIDER_SITE_OTHER): Payer: BC Managed Care – PPO

## 2011-06-03 DIAGNOSIS — J309 Allergic rhinitis, unspecified: Secondary | ICD-10-CM

## 2011-06-24 ENCOUNTER — Ambulatory Visit (INDEPENDENT_AMBULATORY_CARE_PROVIDER_SITE_OTHER): Payer: BC Managed Care – PPO

## 2011-06-24 ENCOUNTER — Ambulatory Visit: Payer: BC Managed Care – PPO | Admitting: Internal Medicine

## 2011-06-24 ENCOUNTER — Other Ambulatory Visit: Payer: Self-pay | Admitting: *Deleted

## 2011-06-24 DIAGNOSIS — J309 Allergic rhinitis, unspecified: Secondary | ICD-10-CM

## 2011-06-24 MED ORDER — ALPRAZOLAM 0.25 MG PO TABS: 0.2500 mg | ORAL_TABLET | Freq: Every day | ORAL | Status: DC | PRN

## 2011-06-24 MED ORDER — CLONAZEPAM 1 MG PO TABS: 1.0000 mg | ORAL_TABLET | Freq: Every evening | ORAL | Status: DC | PRN

## 2011-06-24 NOTE — Telephone Encounter (Signed)
Rx faxed to pharmacy  

## 2011-06-24 NOTE — Telephone Encounter (Signed)
Both Done hardcopy to dahlia/LIM B  

## 2011-06-24 NOTE — Telephone Encounter (Signed)
R'cd fax from [facility] for refill of Clonazepam-Last written 03/29/2011 #30 with 2 refills-please advise

## 2011-06-24 NOTE — Telephone Encounter (Signed)
Also received fax from refill of Alprazolam-Last written 03/29/11 #30 with 2 refills-Please advise

## 2011-06-27 ENCOUNTER — Ambulatory Visit (INDEPENDENT_AMBULATORY_CARE_PROVIDER_SITE_OTHER): Payer: BC Managed Care – PPO

## 2011-06-27 ENCOUNTER — Encounter: Payer: Self-pay | Admitting: Internal Medicine

## 2011-06-27 ENCOUNTER — Ambulatory Visit (INDEPENDENT_AMBULATORY_CARE_PROVIDER_SITE_OTHER): Payer: BC Managed Care – PPO | Admitting: Internal Medicine

## 2011-06-27 VITALS — BP 112/68 | HR 96 | Ht 68.0 in | Wt 186.2 lb

## 2011-06-27 DIAGNOSIS — J3089 Other allergic rhinitis: Secondary | ICD-10-CM

## 2011-06-27 DIAGNOSIS — J309 Allergic rhinitis, unspecified: Secondary | ICD-10-CM

## 2011-06-27 NOTE — Progress Notes (Signed)
Subjective:    Patient ID: Laurie Solomon, female    DOB: [birth year], 40 y.o.   MRN: [id number]  HPI    Review of Systems     Objective:   Physical Exam        Assessment & Plan:   Subjective:    Patient ID: Laurie Solomon, female    DOB: 02/14/1971, 40 y.o.   MRN: [id number]  HPI 03/14/11- 45 yoF 40 yo never smoker followed by [name] for PCP and coming now asking to transfer allergy care here. She has a hx of childhood asthma, never needing more than occasional inhaler. Since teens, she has had perennial watery rhinorhea. She minimizes symptoms of congestion, headache, ear pressure, itching/ sneezing. There is no cough, wheeze or chest tightness. For control she has been on daily claritin and singulair. One year ago she began allergy vaccine at [name] office. Says allergy shots, now once weekly, have been a major improvement. Unfortunately she says [name] office is inconveniently located and regular trips here for shots would be more practical. She expressed interest in giving her own shots and I explained that is being discouraged nationally now. Triggers- house dust, dogs and cats if they lick her, mold, mildew. Her house has some dampness, 2 cats  Denies hx of intolerance to food, cosmetics, insects, aspirin, latex. No hx of allergic dermatitiis.   06/27/11- 40 yoF 40 yo never smoker followed by [name] for PCP. Has transferred allergy care here from Dr Hanover Callas. She is still using up allergy vaccine from Dr Olin Callas with intention to have [place] take over manufacture and administration here because of convenience. No reactions to shots.    Review of Systems See HPI Constitutional:   No weight loss, night sweats,  Fevers, chills, fatigue, lassitude. HEENT:   No headaches,  Difficulty swallowing,  Tooth/dental problems,  Sore throat,           CV:  No chest pain,  Orthopnea, PND, swelling in lower extremities, anasarca, dizziness, palpitations GI  No heartburn, indigestion,  abdominal pain, nausea, vomiting, diarrhea, change in bowel habits, loss of appetite Resp: No shortness of breath with exertion or at rest.  No excess mucus, no productive cough,  No non-productive cough,  No coughing up of blood.  No change in color of mucus.  No wheezing.  No chest wall deformity Skin: no rash or lesions. GU: no dysuria, change in color of urine, no urgency or frequency.  No flank pain. MS:  No joint pain or swelling.  No decreased range of motion.  No back pain. Psych:  No change in mood or affect. No depression or anxiety.  No memory loss.      Objective:   Physical Exam General- Alert, Oriented, Affect-appropriate, Distress- none acute Skin- rash-none, lesions- none, excoriation- none Lymphadenopathy- none Head- atraumatic            Eyes- Gross vision intact, PERRLA, conjunctivae clear secretions            Ears- Hearing, canals normal            Nose- Clear, no-Septal dev, mucus, polyps, erosion, perforation             Throat- Mallampati II , mucosa clear , drainage- none, tonsils- atrophic Neck- flexible , trachea midline, no stridor , thyroid nl, carotid no bruit Chest - symmetrical excursion , unlabored           Heart/CV- RRR , no murmur , no gallop  ,  no rub, nl s1 s2                           - JVD- none , edema- none, stasis changes- none, varices- none           Lung- clear to P&A, wheeze- none, cough- none , dullness-none, rub- none           Chest wall-  Abd- tender-no, distended-no, bowel sounds-present, HSM- no Br/ Gen/ Rectal- Not done, not indicated Extrem- cyanosis- none, clubbing, none, atrophy- none, strength- nl Neuro- grossly intact to observation         Assessment & Plan:

## 2011-06-27 NOTE — Patient Instructions (Signed)
I will discuss your vaccine program with the allergy lab.   We intend to use up [name] vaccine, then supply from here.

## 2011-06-27 NOTE — Assessment & Plan Note (Signed)
I will discuss with allergy lab. Intention is to use up [name] vaccine then begin supplying from here. She is ready for this change. Not currently needing to supplement with antihistamines or nasal sprays, but Fall and Spring are her worst seasons.

## 2011-06-30 ENCOUNTER — Encounter: Payer: Self-pay | Admitting: Internal Medicine

## 2011-07-13 ENCOUNTER — Telehealth: Payer: Self-pay | Admitting: Internal Medicine

## 2011-07-13 NOTE — Telephone Encounter (Signed)
Generic Singulair APPROVED through [facility] from 06/22/2011 through 07/12/2012. Member ID # is [id number]. Case # is [id number].  Both the pt and the pharmacy have been notified.

## 2011-07-14 ENCOUNTER — Ambulatory Visit (INDEPENDENT_AMBULATORY_CARE_PROVIDER_SITE_OTHER): Payer: BC Managed Care – PPO

## 2011-07-14 DIAGNOSIS — J309 Allergic rhinitis, unspecified: Secondary | ICD-10-CM

## 2011-07-15 ENCOUNTER — Telehealth: Payer: Self-pay | Admitting: *Deleted

## 2011-07-15 NOTE — Telephone Encounter (Signed)
I can't remember but I think she said flonase doesn't help her. Sorry, I didn't mean to close the encounter.

## 2011-07-15 NOTE — Telephone Encounter (Signed)
Laurie Solomon came in yesterday for her all.shots. She missed her all.shot last wk;since then her nose runs & her throat is sore and scratchy. She can't take flonase. I'm sorry

## 2011-07-25 ENCOUNTER — Ambulatory Visit (INDEPENDENT_AMBULATORY_CARE_PROVIDER_SITE_OTHER): Payer: BC Managed Care – PPO

## 2011-07-25 DIAGNOSIS — Z23 Encounter for immunization: Secondary | ICD-10-CM

## 2011-07-25 DIAGNOSIS — J309 Allergic rhinitis, unspecified: Secondary | ICD-10-CM

## 2011-08-05 ENCOUNTER — Ambulatory Visit (INDEPENDENT_AMBULATORY_CARE_PROVIDER_SITE_OTHER): Payer: BC Managed Care – PPO

## 2011-08-05 DIAGNOSIS — J309 Allergic rhinitis, unspecified: Secondary | ICD-10-CM

## 2011-08-09 LAB — CBC
HCT: 30.9 — ABNORMAL LOW
HCT: 40.6
MCHC: 34.1
MCV: 93.9
Platelets: 191
Platelets: 204
RDW: 12.5
RDW: 12.6
WBC: 14.3 — ABNORMAL HIGH

## 2011-08-12 ENCOUNTER — Other Ambulatory Visit: Payer: Self-pay

## 2011-08-12 ENCOUNTER — Ambulatory Visit (INDEPENDENT_AMBULATORY_CARE_PROVIDER_SITE_OTHER): Payer: BC Managed Care – PPO

## 2011-08-12 DIAGNOSIS — J309 Allergic rhinitis, unspecified: Secondary | ICD-10-CM

## 2011-08-12 NOTE — Telephone Encounter (Signed)
Called the patient informed of MD's response to prescription request.

## 2011-08-12 NOTE — Telephone Encounter (Signed)
Like most physicians, I just dont feel comfortable with ongoing rx for phentermine long term due to concern about risk of side effect, and not approved for this use

## 2011-08-22 ENCOUNTER — Other Ambulatory Visit: Payer: Self-pay

## 2011-08-22 NOTE — Telephone Encounter (Signed)
Pt informed

## 2011-08-22 NOTE — Telephone Encounter (Signed)
Last filled 06.25.2012.

## 2011-08-22 NOTE — Telephone Encounter (Signed)
[name] to contact pt; phentermine declined as this is not meant to be a long term med, as it is not approved by the FDA for this use

## 2011-08-24 ENCOUNTER — Ambulatory Visit (INDEPENDENT_AMBULATORY_CARE_PROVIDER_SITE_OTHER): Payer: BC Managed Care – PPO

## 2011-08-24 DIAGNOSIS — J309 Allergic rhinitis, unspecified: Secondary | ICD-10-CM

## 2011-08-29 ENCOUNTER — Encounter: Payer: Self-pay | Admitting: Internal Medicine

## 2011-09-08 ENCOUNTER — Ambulatory Visit (INDEPENDENT_AMBULATORY_CARE_PROVIDER_SITE_OTHER): Payer: BC Managed Care – PPO

## 2011-09-08 DIAGNOSIS — J309 Allergic rhinitis, unspecified: Secondary | ICD-10-CM

## 2011-09-16 ENCOUNTER — Ambulatory Visit (INDEPENDENT_AMBULATORY_CARE_PROVIDER_SITE_OTHER): Payer: BC Managed Care – PPO

## 2011-09-16 DIAGNOSIS — J309 Allergic rhinitis, unspecified: Secondary | ICD-10-CM

## 2011-09-28 ENCOUNTER — Ambulatory Visit: Payer: BC Managed Care – PPO | Admitting: Internal Medicine

## 2011-09-28 ENCOUNTER — Ambulatory Visit (INDEPENDENT_AMBULATORY_CARE_PROVIDER_SITE_OTHER): Payer: BC Managed Care – PPO

## 2011-09-28 DIAGNOSIS — J309 Allergic rhinitis, unspecified: Secondary | ICD-10-CM

## 2011-10-18 ENCOUNTER — Ambulatory Visit (INDEPENDENT_AMBULATORY_CARE_PROVIDER_SITE_OTHER): Payer: BC Managed Care – PPO

## 2011-10-18 DIAGNOSIS — J309 Allergic rhinitis, unspecified: Secondary | ICD-10-CM

## 2011-10-27 ENCOUNTER — Other Ambulatory Visit: Payer: Self-pay

## 2011-10-27 MED ORDER — CLONAZEPAM 1 MG PO TABS: 1.0000 mg | ORAL_TABLET | Freq: Every evening | ORAL | Status: DC | PRN

## 2011-10-27 NOTE — Telephone Encounter (Signed)
Faxed hardcopy to pharmacy. 

## 2011-10-27 NOTE — Telephone Encounter (Signed)
Done hardcopy to robin  

## 2011-11-03 ENCOUNTER — Ambulatory Visit (INDEPENDENT_AMBULATORY_CARE_PROVIDER_SITE_OTHER): Payer: BC Managed Care – PPO

## 2011-11-03 DIAGNOSIS — J309 Allergic rhinitis, unspecified: Secondary | ICD-10-CM

## 2011-11-04 ENCOUNTER — Ambulatory Visit: Payer: BC Managed Care – PPO | Admitting: Internal Medicine

## 2011-11-07 ENCOUNTER — Ambulatory Visit (INDEPENDENT_AMBULATORY_CARE_PROVIDER_SITE_OTHER): Payer: BC Managed Care – PPO | Admitting: Internal Medicine

## 2011-11-07 ENCOUNTER — Encounter: Payer: Self-pay | Admitting: Internal Medicine

## 2011-11-07 ENCOUNTER — Telehealth: Payer: Self-pay

## 2011-11-07 VITALS — BP 100/70 | HR 88 | Temp 98.2°F | Ht 68.0 in | Wt 194.1 lb

## 2011-11-07 DIAGNOSIS — J3089 Other allergic rhinitis: Secondary | ICD-10-CM

## 2011-11-07 DIAGNOSIS — E669 Obesity, unspecified: Secondary | ICD-10-CM

## 2011-11-07 DIAGNOSIS — Z0000 Encounter for general adult medical examination without abnormal findings: Secondary | ICD-10-CM

## 2011-11-07 DIAGNOSIS — J209 Acute bronchitis, unspecified: Secondary | ICD-10-CM

## 2011-11-07 DIAGNOSIS — R062 Wheezing: Secondary | ICD-10-CM

## 2011-11-07 MED ORDER — PREDNISONE 10 MG PO TABS: ORAL_TABLET | ORAL | Status: DC

## 2011-11-07 MED ORDER — HYDROCODONE-HOMATROPINE 5-1.5 MG/5ML PO SYRP: 5.0000 mL | ORAL_SOLUTION | Freq: Four times a day (QID) | ORAL | Status: AC | PRN

## 2011-11-07 MED ORDER — PHENTERMINE-TOPIRAMATE ER 3.75-23 MG PO CP24: 1.0000 | ORAL_CAPSULE | Freq: Two times a day (BID) | ORAL | Status: DC

## 2011-11-07 MED ORDER — PROMETHAZINE HCL 25 MG PO TABS: 25.0000 mg | ORAL_TABLET | Freq: Four times a day (QID) | ORAL | Status: DC | PRN

## 2011-11-07 MED ORDER — METHYLPREDNISOLONE ACETATE PF 80 MG/ML IJ SUSP
120.0000 mg | Freq: Once | INTRAMUSCULAR | Status: AC
  Administered 2011-11-07: 120 mg via INTRAMUSCULAR

## 2011-11-07 MED ORDER — AZITHROMYCIN 250 MG PO TABS
ORAL_TABLET | ORAL | Status: AC
Start: 2011-11-07 — End: 2011-11-12

## 2011-11-07 NOTE — Telephone Encounter (Signed)
?   Viral illness  - for tylenol, mucinex, lots of fluids, and ok for phenergan prn - done per emr

## 2011-11-07 NOTE — Patient Instructions (Signed)
Take all new medications as prescribed You had the steroid shot today Continue all other medications as before The first prescription for the Qsymia is the normal dose for 2 wks, to see if the insurance will pay; please call if this works and therest of the prescription can be done

## 2011-11-07 NOTE — Telephone Encounter (Signed)
Patient has nausea, fever, cough and wheezing. Please advise

## 2011-11-07 NOTE — Telephone Encounter (Signed)
Patient was seen in our office today 11/07/2011

## 2011-11-08 ENCOUNTER — Encounter: Payer: Self-pay | Admitting: Internal Medicine

## 2011-11-08 NOTE — Assessment & Plan Note (Signed)
Mild to mod, for depomedrol IM,and predpack asd,  to f/u any worsening symptoms or concerns 

## 2011-11-08 NOTE — Assessment & Plan Note (Addendum)
stable overall by hx and exam, , and pt to continue medical treatment as before   

## 2011-11-08 NOTE — Assessment & Plan Note (Signed)
Mild to mod, for antibx course,  to f/u any worsening symptoms or concerns 

## 2011-11-08 NOTE — Progress Notes (Signed)
Subjective:    Patient ID: Laurie Solomon, female    DOB: [birth year], 41 y.o.   MRN: [id number]  HPI  Here with acute onset mild to mod 4 days ST, HA, general weakness and malaise, with prod cough greenish sputum, but Pt denies chest pain, increased sob or doe, wheezing, orthopnea, PND, increased LE swelling, palpitations, dizziness or syncope, except for mild wheezingonset this am.  Recent allergy and asthma symptoms have been well controlled on current meds and Overall good compliance with treatment, and good medicine tolerability.  Pt denies new neurological symptoms such as new headache, or facial or extremity weakness or numbness   Pt denies polydipsia, polyuria.  Unable to lose significant wt and is asking for qsymia trial, has tried phentermine in the past with some wt loss, but regained the wt.  Does also have tender/pain to the left lateral hip area without swelling, rash, trauma, lower back pain or more distal leg pain, or groin pain.   Denies urinary symptoms such as dysuria, frequency, urgency,or hematuria. Past Medical History  Diagnosis Date  . Chronic insomnia   . Post traumatic stress disorder   . Headache   . Asthma     childhood  . Allergic rhinitis    Past Surgical History  Procedure Date  . Refractive surgery     reports that she has never smoked. She has never used smokeless tobacco. She reports that she drinks about one ounce of alcohol per week. She reports that she does not use illicit drugs. family history includes Breast cancer in her others; Diabetes in her paternal grandfather; Heart disease in her maternal grandfather; and Hypertension in her maternal grandfather. No Known Allergies Current Outpatient Prescriptions on File Prior to Visit  Medication Sig Dispense Refill  . albuterol (VENTOLIN HFA) 108 (90 BASE) MCG/ACT inhaler Inhale 1-2 puffs into the lungs every 6 (six) hours as needed.        . ALPRAZolam (XANAX) 0.25 MG tablet Take 1 tablet (0.25 mg total) by  mouth daily as needed.  30 tablet  3  . clonazePAM (KLONOPIN) 1 MG tablet Take 1 tablet (1 mg total) by mouth at bedtime as needed.  30 tablet  3  . montelukast (SINGULAIR) 10 MG tablet Take 1 tablet (10 mg total) by mouth daily.  30 tablet  prn  . Multiple Vitamin (MULTIVITAMIN) tablet Take 1 tablet by mouth daily.        . promethazine (PHENERGAN) 25 MG tablet Take 1 tablet (25 mg total) by mouth every 6 (six) hours as needed for nausea.  30 tablet  0  . sertraline (ZOLOFT) 100 MG tablet Take 100 mg by mouth daily.         No current facility-administered medications on file prior to visit.   Review of Systems Review of Systems  Constitutional: Negative for diaphoresis and unexpected weight change.  HENT: Negative for drooling and tinnitus.   Eyes: Negative for photophobia and visual disturbance.  Respiratory: Negative for choking and stridor.   Gastrointestinal: Negative for vomiting and blood in stool.    Objective:   Physical Exam BP 100/70  Pulse 88  Temp(Src) 98.2 F (36.8 C) (Oral)  Ht 5\' 8"  (1.727 m)  Wt 194 lb 2 oz (88.055 kg)  BMI 29.52 kg/m2  SpO2 95% Physical Exam  VS noted Constitutional: Pt appears well-developed and well-nourished/severe obese.  HENT: Head: Normocephalic.  Right Ear: External ear normal.  Left Ear: External ear normal.  Bilat tm's mild erythema.  Sinus nontender.  Pharynx mild erythema Eyes: Conjunctivae and EOM are normal. Pupils are equal, round, and reactive to light.  Neck: Normal range of motion. Neck supple.  Cardiovascular: Normal rate and regular rhythm.   Pulmonary/Chest: Effort normal and breath sounds decreased bilat, mild wheezing, no rales.  Abd:  Soft, NT, non-distended, + BS Neurological: Pt is alert. No cranial nerve deficit.  Skin: Skin is warm. No erythema.  Psychiatric: Pt behavior is normal. Thought content normal.  Point tenderness noted over left greater trochanter reproducing pt's pain    Assessment & Plan:

## 2011-11-08 NOTE — Assessment & Plan Note (Signed)
Ok for qsymia trial,  to f/u any worsening symptoms or concerns  

## 2011-11-14 ENCOUNTER — Encounter: Payer: Self-pay | Admitting: Internal Medicine

## 2011-11-25 ENCOUNTER — Ambulatory Visit (INDEPENDENT_AMBULATORY_CARE_PROVIDER_SITE_OTHER): Payer: BC Managed Care – PPO

## 2011-11-25 DIAGNOSIS — J309 Allergic rhinitis, unspecified: Secondary | ICD-10-CM

## 2011-12-01 ENCOUNTER — Ambulatory Visit (INDEPENDENT_AMBULATORY_CARE_PROVIDER_SITE_OTHER): Payer: BC Managed Care – PPO

## 2011-12-01 DIAGNOSIS — J309 Allergic rhinitis, unspecified: Secondary | ICD-10-CM

## 2011-12-09 ENCOUNTER — Ambulatory Visit (INDEPENDENT_AMBULATORY_CARE_PROVIDER_SITE_OTHER): Payer: BC Managed Care – PPO

## 2011-12-09 DIAGNOSIS — J309 Allergic rhinitis, unspecified: Secondary | ICD-10-CM

## 2011-12-16 ENCOUNTER — Ambulatory Visit (INDEPENDENT_AMBULATORY_CARE_PROVIDER_SITE_OTHER): Payer: BC Managed Care – PPO

## 2011-12-16 DIAGNOSIS — J309 Allergic rhinitis, unspecified: Secondary | ICD-10-CM

## 2011-12-30 ENCOUNTER — Ambulatory Visit (INDEPENDENT_AMBULATORY_CARE_PROVIDER_SITE_OTHER): Payer: BC Managed Care – PPO

## 2011-12-30 DIAGNOSIS — J309 Allergic rhinitis, unspecified: Secondary | ICD-10-CM

## 2012-01-02 ENCOUNTER — Telehealth: Payer: Self-pay | Admitting: *Deleted

## 2012-01-02 NOTE — Telephone Encounter (Signed)
Will make up 1:500 and have vac.ready this wk.when [email or web address] for her shot.

## 2012-01-02 NOTE — Telephone Encounter (Signed)
Pt.came in for her shot Fri.. She's used up #1&#2 of 1:100 from [facility],she still has #3(cat 6cc left) she gets 1cc once a month. I have the written rx to advance to our 1:500.(dated 05-26-11) Please advise.

## 2012-01-02 NOTE — Telephone Encounter (Signed)
As discussed- we can start our vaccine at 1:500 and stop giving the outside vaccine.

## 2012-01-04 ENCOUNTER — Ambulatory Visit (INDEPENDENT_AMBULATORY_CARE_PROVIDER_SITE_OTHER): Payer: BC Managed Care – PPO

## 2012-01-04 DIAGNOSIS — J309 Allergic rhinitis, unspecified: Secondary | ICD-10-CM

## 2012-01-12 ENCOUNTER — Encounter: Payer: Self-pay | Admitting: Internal Medicine

## 2012-01-25 ENCOUNTER — Ambulatory Visit (INDEPENDENT_AMBULATORY_CARE_PROVIDER_SITE_OTHER): Payer: BC Managed Care – PPO

## 2012-01-25 DIAGNOSIS — J309 Allergic rhinitis, unspecified: Secondary | ICD-10-CM

## 2012-01-30 ENCOUNTER — Ambulatory Visit (INDEPENDENT_AMBULATORY_CARE_PROVIDER_SITE_OTHER): Payer: BC Managed Care – PPO

## 2012-01-30 ENCOUNTER — Other Ambulatory Visit: Payer: Self-pay

## 2012-01-30 DIAGNOSIS — J309 Allergic rhinitis, unspecified: Secondary | ICD-10-CM

## 2012-01-30 MED ORDER — ALPRAZOLAM 0.25 MG PO TABS: 0.2500 mg | ORAL_TABLET | Freq: Every day | ORAL | Status: DC | PRN

## 2012-01-30 NOTE — Telephone Encounter (Signed)
Faxed hardcopy to pharmacy. 

## 2012-01-30 NOTE — Telephone Encounter (Signed)
Done hardcopy to robin  

## 2012-02-10 ENCOUNTER — Ambulatory Visit (INDEPENDENT_AMBULATORY_CARE_PROVIDER_SITE_OTHER): Payer: BC Managed Care – PPO

## 2012-02-10 DIAGNOSIS — J309 Allergic rhinitis, unspecified: Secondary | ICD-10-CM

## 2012-02-17 ENCOUNTER — Ambulatory Visit (INDEPENDENT_AMBULATORY_CARE_PROVIDER_SITE_OTHER): Payer: BC Managed Care – PPO

## 2012-02-17 DIAGNOSIS — J309 Allergic rhinitis, unspecified: Secondary | ICD-10-CM

## 2012-02-23 ENCOUNTER — Other Ambulatory Visit: Payer: Self-pay

## 2012-02-23 MED ORDER — CLONAZEPAM 1 MG PO TABS: 1.0000 mg | ORAL_TABLET | Freq: Every evening | ORAL | Status: DC | PRN

## 2012-02-23 NOTE — Telephone Encounter (Signed)
Done hardcopy to robin  

## 2012-02-23 NOTE — Telephone Encounter (Signed)
Faxed hardcopy to pharmacy. 

## 2012-02-24 ENCOUNTER — Other Ambulatory Visit: Payer: Self-pay | Admitting: Obstetrics

## 2012-02-24 DIAGNOSIS — N6452 Nipple discharge: Secondary | ICD-10-CM

## 2012-02-27 ENCOUNTER — Ambulatory Visit (INDEPENDENT_AMBULATORY_CARE_PROVIDER_SITE_OTHER): Payer: BC Managed Care – PPO

## 2012-02-27 ENCOUNTER — Ambulatory Visit
Admission: RE | Admit: 2012-02-27 | Discharge: 2012-02-27 | Disposition: A | Discharge status: Home / Self Care | Payer: BC Managed Care – PPO | Source: Ambulatory Visit | Attending: Obstetrics | Admitting: Obstetrics

## 2012-02-27 DIAGNOSIS — N6452 Nipple discharge: Secondary | ICD-10-CM

## 2012-02-27 DIAGNOSIS — J309 Allergic rhinitis, unspecified: Secondary | ICD-10-CM

## 2012-03-08 ENCOUNTER — Ambulatory Visit (INDEPENDENT_AMBULATORY_CARE_PROVIDER_SITE_OTHER): Payer: BC Managed Care – PPO

## 2012-03-08 DIAGNOSIS — J309 Allergic rhinitis, unspecified: Secondary | ICD-10-CM

## 2012-03-16 ENCOUNTER — Ambulatory Visit (INDEPENDENT_AMBULATORY_CARE_PROVIDER_SITE_OTHER): Payer: BC Managed Care – PPO

## 2012-03-16 DIAGNOSIS — J309 Allergic rhinitis, unspecified: Secondary | ICD-10-CM

## 2012-03-22 ENCOUNTER — Ambulatory Visit (INDEPENDENT_AMBULATORY_CARE_PROVIDER_SITE_OTHER): Payer: BC Managed Care – PPO

## 2012-03-22 DIAGNOSIS — J309 Allergic rhinitis, unspecified: Secondary | ICD-10-CM

## 2012-04-16 ENCOUNTER — Ambulatory Visit (INDEPENDENT_AMBULATORY_CARE_PROVIDER_SITE_OTHER): Payer: BC Managed Care – PPO

## 2012-04-16 DIAGNOSIS — J309 Allergic rhinitis, unspecified: Secondary | ICD-10-CM

## 2012-04-30 ENCOUNTER — Other Ambulatory Visit: Payer: Self-pay

## 2012-04-30 NOTE — Telephone Encounter (Signed)
Pharmacy informed.

## 2012-04-30 NOTE — Telephone Encounter (Signed)
Klonopin should still have refills to august

## 2012-05-01 ENCOUNTER — Ambulatory Visit (INDEPENDENT_AMBULATORY_CARE_PROVIDER_SITE_OTHER): Payer: BC Managed Care – PPO

## 2012-05-01 DIAGNOSIS — J309 Allergic rhinitis, unspecified: Secondary | ICD-10-CM

## 2012-05-02 ENCOUNTER — Telehealth: Payer: Self-pay | Admitting: Internal Medicine

## 2012-05-02 MED ORDER — CLONAZEPAM 1 MG PO TABS: 1.0000 mg | ORAL_TABLET | Freq: Every evening | ORAL | Status: DC | PRN

## 2012-05-02 NOTE — Telephone Encounter (Signed)
[facility] [address] Called the patient to inform had to leave detailed message.

## 2012-05-02 NOTE — Telephone Encounter (Signed)
Ok for early this time only - Done hardcopy to [facility]

## 2012-05-02 NOTE — Telephone Encounter (Signed)
Pt went out town at the end of May or June to [place].  [facility] to [place].  She states that [facility] only allows one transfer, and won't transfer her refills back to the [facility] on [address]  She couldn't remember the date of last refill.

## 2012-05-09 ENCOUNTER — Ambulatory Visit (INDEPENDENT_AMBULATORY_CARE_PROVIDER_SITE_OTHER): Payer: BC Managed Care – PPO

## 2012-05-09 DIAGNOSIS — J309 Allergic rhinitis, unspecified: Secondary | ICD-10-CM

## 2012-05-23 ENCOUNTER — Ambulatory Visit (INDEPENDENT_AMBULATORY_CARE_PROVIDER_SITE_OTHER): Payer: BC Managed Care – PPO

## 2012-05-23 DIAGNOSIS — J309 Allergic rhinitis, unspecified: Secondary | ICD-10-CM

## 2012-05-27 ENCOUNTER — Other Ambulatory Visit: Payer: Self-pay | Admitting: Internal Medicine

## 2012-07-02 ENCOUNTER — Other Ambulatory Visit: Payer: Self-pay

## 2012-07-02 MED ORDER — CLONAZEPAM 1 MG PO TABS: 1.0000 mg | ORAL_TABLET | Freq: Every evening | ORAL | Status: DC | PRN

## 2012-07-02 NOTE — Telephone Encounter (Signed)
Pt called requesting refill for Klonopin. Pt transferred Rx out of town and refills on Rx was subsequently cancelled.

## 2012-07-02 NOTE — Telephone Encounter (Signed)
Called the patient confirmed correct pharmacy to send to. [facility]

## 2012-07-02 NOTE — Telephone Encounter (Signed)
Done hardcopy to robin  

## 2012-07-06 ENCOUNTER — Ambulatory Visit (INDEPENDENT_AMBULATORY_CARE_PROVIDER_SITE_OTHER): Payer: BC Managed Care – PPO

## 2012-07-06 DIAGNOSIS — J309 Allergic rhinitis, unspecified: Secondary | ICD-10-CM

## 2012-07-23 ENCOUNTER — Ambulatory Visit (INDEPENDENT_AMBULATORY_CARE_PROVIDER_SITE_OTHER): Payer: BC Managed Care – PPO

## 2012-07-23 DIAGNOSIS — J309 Allergic rhinitis, unspecified: Secondary | ICD-10-CM

## 2012-07-27 ENCOUNTER — Encounter: Payer: Self-pay | Admitting: Internal Medicine

## 2012-08-03 ENCOUNTER — Ambulatory Visit (INDEPENDENT_AMBULATORY_CARE_PROVIDER_SITE_OTHER): Payer: BC Managed Care – PPO

## 2012-08-03 DIAGNOSIS — J309 Allergic rhinitis, unspecified: Secondary | ICD-10-CM

## 2012-08-20 ENCOUNTER — Ambulatory Visit (INDEPENDENT_AMBULATORY_CARE_PROVIDER_SITE_OTHER): Payer: BC Managed Care – PPO

## 2012-08-20 DIAGNOSIS — Z23 Encounter for immunization: Secondary | ICD-10-CM

## 2012-08-20 DIAGNOSIS — J309 Allergic rhinitis, unspecified: Secondary | ICD-10-CM

## 2012-08-21 DIAGNOSIS — Z23 Encounter for immunization: Secondary | ICD-10-CM

## 2012-09-07 ENCOUNTER — Ambulatory Visit (INDEPENDENT_AMBULATORY_CARE_PROVIDER_SITE_OTHER): Payer: BC Managed Care – PPO

## 2012-09-07 DIAGNOSIS — J309 Allergic rhinitis, unspecified: Secondary | ICD-10-CM

## 2012-09-13 ENCOUNTER — Other Ambulatory Visit: Payer: Self-pay | Admitting: Internal Medicine

## 2012-09-13 ENCOUNTER — Ambulatory Visit (INDEPENDENT_AMBULATORY_CARE_PROVIDER_SITE_OTHER): Payer: BC Managed Care – PPO

## 2012-09-13 DIAGNOSIS — J309 Allergic rhinitis, unspecified: Secondary | ICD-10-CM

## 2012-09-13 NOTE — Telephone Encounter (Signed)
Done hardcopy to robin  

## 2012-09-14 NOTE — Telephone Encounter (Signed)
Faced hardcopy to pharmacy

## 2012-10-02 ENCOUNTER — Ambulatory Visit (INDEPENDENT_AMBULATORY_CARE_PROVIDER_SITE_OTHER): Payer: BC Managed Care – PPO

## 2012-10-02 DIAGNOSIS — J309 Allergic rhinitis, unspecified: Secondary | ICD-10-CM

## 2012-10-12 ENCOUNTER — Ambulatory Visit (INDEPENDENT_AMBULATORY_CARE_PROVIDER_SITE_OTHER): Payer: BC Managed Care – PPO

## 2012-10-12 DIAGNOSIS — J309 Allergic rhinitis, unspecified: Secondary | ICD-10-CM

## 2012-10-29 ENCOUNTER — Ambulatory Visit (INDEPENDENT_AMBULATORY_CARE_PROVIDER_SITE_OTHER): Payer: BC Managed Care – PPO

## 2012-10-29 DIAGNOSIS — J309 Allergic rhinitis, unspecified: Secondary | ICD-10-CM

## 2012-11-21 ENCOUNTER — Ambulatory Visit (INDEPENDENT_AMBULATORY_CARE_PROVIDER_SITE_OTHER): Payer: BC Managed Care – PPO

## 2012-11-21 DIAGNOSIS — J309 Allergic rhinitis, unspecified: Secondary | ICD-10-CM

## 2012-11-23 ENCOUNTER — Ambulatory Visit: Payer: BC Managed Care – PPO

## 2012-11-30 ENCOUNTER — Ambulatory Visit: Payer: BC Managed Care – PPO

## 2012-12-04 ENCOUNTER — Other Ambulatory Visit: Payer: Self-pay | Admitting: Internal Medicine

## 2012-12-04 NOTE — Telephone Encounter (Signed)
Done hardcopy to robin  

## 2012-12-04 NOTE — Telephone Encounter (Signed)
Faxed hardcopy to pharmacy. 

## 2012-12-12 ENCOUNTER — Telehealth: Payer: Self-pay | Admitting: Internal Medicine

## 2012-12-12 ENCOUNTER — Ambulatory Visit (INDEPENDENT_AMBULATORY_CARE_PROVIDER_SITE_OTHER): Payer: BC Managed Care – PPO

## 2012-12-12 DIAGNOSIS — J309 Allergic rhinitis, unspecified: Secondary | ICD-10-CM

## 2012-12-12 MED ORDER — MOMETASONE FUROATE 50 MCG/ACT NA SUSP: NASAL | Status: DC

## 2012-12-12 NOTE — Telephone Encounter (Signed)
Pt.'s old allergist gave her nasonex. It's helps when she has the sniffles. She wanted to know if you would call this in for her or does she need to call and make an appt. First?. Pt's # [phone]  [facility] [address] [phone]. In you call this in.

## 2012-12-12 NOTE — Telephone Encounter (Signed)
Per CY-okay to give Nasonex #1 1-2 puffs each nostril once or twice daily with prn refills.

## 2012-12-12 NOTE — Telephone Encounter (Signed)
I hit cancel when it ask for a provider, I didn't realize it created this note. My bad.

## 2012-12-12 NOTE — Telephone Encounter (Signed)
lmovm rx sent  Nothing further needed.

## 2012-12-19 ENCOUNTER — Ambulatory Visit: Payer: BC Managed Care – PPO

## 2012-12-26 ENCOUNTER — Ambulatory Visit (INDEPENDENT_AMBULATORY_CARE_PROVIDER_SITE_OTHER): Payer: BC Managed Care – PPO

## 2013-01-04 ENCOUNTER — Ambulatory Visit (INDEPENDENT_AMBULATORY_CARE_PROVIDER_SITE_OTHER): Payer: BC Managed Care – PPO

## 2013-01-04 DIAGNOSIS — J309 Allergic rhinitis, unspecified: Secondary | ICD-10-CM

## 2013-01-07 ENCOUNTER — Ambulatory Visit (INDEPENDENT_AMBULATORY_CARE_PROVIDER_SITE_OTHER): Payer: BC Managed Care – PPO

## 2013-01-10 ENCOUNTER — Ambulatory Visit (INDEPENDENT_AMBULATORY_CARE_PROVIDER_SITE_OTHER): Payer: BC Managed Care – PPO

## 2013-01-10 DIAGNOSIS — J309 Allergic rhinitis, unspecified: Secondary | ICD-10-CM

## 2013-01-11 ENCOUNTER — Other Ambulatory Visit: Payer: Self-pay | Admitting: Internal Medicine

## 2013-01-14 NOTE — Telephone Encounter (Signed)
Pt due for appt 

## 2013-01-14 NOTE — Telephone Encounter (Signed)
Called the patient left detailed message to schedule office visit.

## 2013-01-15 ENCOUNTER — Telehealth: Payer: Self-pay

## 2013-01-15 MED ORDER — CLONAZEPAM 1 MG PO TABS: ORAL_TABLET | ORAL | Status: DC

## 2013-01-15 NOTE — Telephone Encounter (Signed)
The patient has scheduled an appointment for Friday.  She would like to have her clonazepam refilled

## 2013-01-15 NOTE — Telephone Encounter (Signed)
Faxed hardcopy to pharmacy and informed the patient as well.

## 2013-01-15 NOTE — Telephone Encounter (Signed)
Done hardcopy to robin  

## 2013-01-16 ENCOUNTER — Ambulatory Visit (INDEPENDENT_AMBULATORY_CARE_PROVIDER_SITE_OTHER): Payer: BC Managed Care – PPO

## 2013-01-18 ENCOUNTER — Encounter: Payer: Self-pay | Admitting: Internal Medicine

## 2013-01-18 ENCOUNTER — Ambulatory Visit (INDEPENDENT_AMBULATORY_CARE_PROVIDER_SITE_OTHER): Payer: BC Managed Care – PPO | Admitting: Internal Medicine

## 2013-01-18 VITALS — BP 112/70 | HR 96 | Temp 97.5°F | Ht 68.0 in | Wt 212.2 lb

## 2013-01-18 MED ORDER — MONTELUKAST SODIUM 10 MG PO TABS: 10.0000 mg | ORAL_TABLET | Freq: Every day | ORAL | Status: DC

## 2013-01-18 MED ORDER — ALPRAZOLAM 0.25 MG PO TABS: ORAL_TABLET | ORAL | Status: DC

## 2013-01-18 MED ORDER — PHENTERMINE HCL 37.5 MG PO CAPS: 37.5000 mg | ORAL_CAPSULE | ORAL | Status: DC

## 2013-01-18 NOTE — Assessment & Plan Note (Signed)
stable overall by history and exam, recent data reviewed with pt, and pt to continue medical treatment as before,  to f/u any worsening symptoms or concerns SpO2 Readings from Last 3 Encounters:  01/18/13 95%  11/07/11 95%  06/27/11 98%

## 2013-01-18 NOTE — Assessment & Plan Note (Signed)
Has done well with mostly regular use of the clonazpem, no longer does well on the [place] she had for yrs prior, cont as is,  to f/u any worsening symptoms or concerns

## 2013-01-18 NOTE — Assessment & Plan Note (Signed)
Ok for phentermine asd,  to f/u any worsening symptoms or concerns °

## 2013-01-18 NOTE — Patient Instructions (Addendum)
Please take all new medication as prescribed - the phentermine Please continue all other medications as before, and refills have been done if requested - the xanax (given hardcopy) and the singulair Please remember to followup with your GYN for the yearly pap smear and/or mammogram as you do Thank you for enrolling in MyChart. Please follow the instructions below to securely access your online medical record. MyChart allows you to send messages to your doctor, view your test results, renew your prescriptions, schedule appointments, and more. To Log into My Chart online, please go by [employer] or [employer] to [email or web address], or download the MyChart App from the [employer] of [employer] .  Your Username is: [email or web address] (pass havoc) Please send a Engineer, water on Mychart later today. Please return in 1 year for your yearly visit, or sooner if needed, with Lab testing done 3-5 days before

## 2013-01-18 NOTE — Progress Notes (Signed)
Subjective:    Patient ID: Laurie Solomon, female    DOB: 1971-08-23, 42 y.o.   MRN: [id number]  HPI  Here to f/u, Here to f/u; overall doing ok,  Pt denies chest pain, increased sob or doe, wheezing, orthopnea, PND, increased LE swelling, palpitations, dizziness or syncope.  Pt denies polydipsia, polyuria  Pt denies new neurological symptoms such as new headache, or facial or extremity weakness or numbness.   Pt states overall good compliance with meds, has been trying to follow lower cholesterol, diabetic diet, with wt overall stable,  but little exercise however. Plans to try to start more regular, but very difficult to get wt off. No significant wt loss with the qsymia.  Denies worsening depressive symptoms, suicidal ideation, or panic; has ongoing anxiety, stable Past Medical History  Diagnosis Date  . Chronic insomnia   . Post traumatic stress disorder   . Headache   . Asthma     childhood  . Allergic rhinitis    Past Surgical History  Procedure Laterality Date  . Refractive surgery      reports that she has never smoked. She has never used smokeless tobacco. She reports that she drinks about 1.0 ounces of alcohol per week. She reports that she does not use illicit drugs. family history includes Breast cancer in her others; Diabetes in her paternal grandfather; Heart disease in her maternal grandfather; and Hypertension in her maternal grandfather. No Known Allergies Current Outpatient Prescriptions on File Prior to Visit  Medication Sig Dispense Refill  . albuterol (VENTOLIN HFA) 108 (90 BASE) MCG/ACT inhaler Inhale 1-2 puffs into the lungs every 6 (six) hours as needed.        . clonazePAM (KLONOPIN) 1 MG tablet TAKE 1 TABLET NIGHTLY AT BEDTIME AS NEEDED  30 tablet  0  . mometasone (NASONEX) 50 MCG/ACT nasal spray 1-2 puffs in nostril once or twice daily  17 g  prn  . Multiple Vitamin (MULTIVITAMIN) tablet Take 1 tablet by mouth daily.        . sertraline (ZOLOFT) 100 MG tablet Take  100 mg by mouth daily.        . Phentermine-Topiramate (QSYMIA) 3.75-23 MG CP24 Take 1 tablet by mouth 2 (two) times daily.  14 capsule  0   No current facility-administered medications on file prior to visit.   Review of Systems  Constitutional: Negative for unexpected weight change, or unusual diaphoresis  HENT: Negative for tinnitus.   Eyes: Negative for photophobia and visual disturbance.  Respiratory: Negative for choking and stridor.   Gastrointestinal: Negative for vomiting and blood in stool.  Genitourinary: Negative for hematuria and decreased urine volume.  Musculoskeletal: Negative for acute joint swelling Skin: Negative for color change and wound.  Neurological: Negative for tremors and numbness other than noted  Psychiatric/Behavioral: Negative for decreased concentration or  hyperactivity.       Objective:   Physical Exam BP 112/70  Pulse 96  Temp(Src) 97.5 F (36.4 C) (Oral)  Ht 5\' 8"  (1.727 m)  Wt 212 lb 4 oz (96.276 kg)  BMI 32.28 kg/m2  SpO2 95% VS noted,  Constitutional: Pt appears well-developed and well-nourished. Laurie Solomon HENT: Head: NCAT.  Right Ear: External ear normal.  Left Ear: External ear normal.  Eyes: Conjunctivae and EOM are normal. Pupils are equal, round, and reactive to light.  Neck: Normal range of motion. Neck supple.  Cardiovascular: Normal rate and regular rhythm.   Pulmonary/Chest: Effort normal and breath sounds normal.  Abd:  Soft,  NT, non-distended, + BS Neurological: Pt is alert. Not confused  Skin: Skin is warm. No erythema.  Psychiatric: Pt behavior is normal. Thought content normal. 1+ nervous    Assessment & Plan:

## 2013-01-18 NOTE — Assessment & Plan Note (Signed)
A bit unusual situation, but ok for xanax prn during daytime use, only uses occasionally

## 2013-01-19 ENCOUNTER — Encounter: Payer: Self-pay | Admitting: Internal Medicine

## 2013-01-23 ENCOUNTER — Ambulatory Visit: Payer: BC Managed Care – PPO

## 2013-01-25 ENCOUNTER — Ambulatory Visit (INDEPENDENT_AMBULATORY_CARE_PROVIDER_SITE_OTHER): Payer: BC Managed Care – PPO

## 2013-01-25 DIAGNOSIS — J309 Allergic rhinitis, unspecified: Secondary | ICD-10-CM

## 2013-01-29 ENCOUNTER — Encounter: Payer: Self-pay | Admitting: Internal Medicine

## 2013-01-29 ENCOUNTER — Ambulatory Visit (INDEPENDENT_AMBULATORY_CARE_PROVIDER_SITE_OTHER): Payer: BC Managed Care – PPO

## 2013-01-29 ENCOUNTER — Ambulatory Visit (INDEPENDENT_AMBULATORY_CARE_PROVIDER_SITE_OTHER): Payer: BC Managed Care – PPO | Admitting: Internal Medicine

## 2013-01-29 ENCOUNTER — Other Ambulatory Visit: Payer: Self-pay | Admitting: Internal Medicine

## 2013-01-29 VITALS — BP 110/70 | HR 94 | Ht 68.0 in | Wt 213.4 lb

## 2013-01-29 DIAGNOSIS — J309 Allergic rhinitis, unspecified: Secondary | ICD-10-CM

## 2013-01-29 DIAGNOSIS — J302 Other seasonal allergic rhinitis: Secondary | ICD-10-CM

## 2013-01-29 DIAGNOSIS — J45909 Unspecified asthma, uncomplicated: Secondary | ICD-10-CM

## 2013-01-29 DIAGNOSIS — J45998 Other asthma: Secondary | ICD-10-CM

## 2013-01-29 MED ORDER — IPRATROPIUM BROMIDE 0.06 % NA SOLN: NASAL | Status: DC

## 2013-01-29 NOTE — Progress Notes (Signed)
Subjective:    Patient ID: Laurie Solomon, female    DOB: [birth year], 42 y.o.   MRN: [id number]  HPI 03/14/11- 25 yoF 42 yo never smoker followed by [name] for PCP and coming now asking to transfer allergy care here. She has a hx of childhood asthma, never needing more than occasional inhaler. Since teens, she has had perennial watery rhinorhea. She minimizes symptoms of congestion, headache, ear pressure, itching/ sneezing. There is no cough, wheeze or chest tightness. For control she has been on daily claritin and singulair. One year ago she began allergy vaccine at [name] office. Says allergy shots, now once weekly, have been a major improvement. Unfortunately she says [name] office is inconveniently located and regular trips here for shots would be more practical. She expressed interest in giving her own shots and I explained that is being discouraged nationally now. Triggers- house dust, dogs and cats if they lick her, mold, mildew. Her house has some dampness, 2 cats  Denies hx of intolerance to food, cosmetics, insects, aspirin, latex. No hx of allergic dermatitiis.   06/27/11- 40 yoF  never smoker followed by [name] for PCP. Has transferred allergy care here from Dr [place] Callas. She is still using up allergy vaccine from Dr [place] Callas with intention to have [place] take over manufacture and administration here because of convenience. No reactions to shots.   01/29/13- 54 yoF never smoker followed by [name] for PCP. Has transferred allergy care here from Dr [place] Callas. FOLLOWS FOR: still on vaccine and doing well; denies any flare ups at this time. She continues allergy vaccine here 1:50 based on original prescription by Dr. Lyla Son testing. Her cancer both died since last seen. She had one small local reaction with a Elbow otherwise as tolerated vaccine extremely well. She says she is having fewer upper respiratory and sinus problems with less sinusitis Occasional Nasonex and daily Claritin. Strong  odors give her headache.  Review of Systems See HPI Constitutional:   No weight loss, night sweats,  Fevers, chills, fatigue, lassitude. HEENT:   No headaches,  Difficulty swallowing,  Tooth/dental problems,  Sore throat,           CV:  No chest pain,  Orthopnea, PND, swelling in lower extremities, anasarca, dizziness, palpitations GI  No heartburn, indigestion, abdominal pain, nausea, vomiting, diarrhea, change in bowel habits, loss of appetite Resp: No shortness of breath with exertion or at rest.  No excess mucus, no productive cough,  No non-productive cough,  No coughing up of blood.  No change in color of mucus.  No wheezing.  No chest wall deformity Skin: no rash or lesions. GU:  MS:  No joint pain or swelling.   Psych:  No change in mood or affect. No depression or anxiety.  No memory loss.    Objective:   Physical Exam General- Alert, Oriented, Affect-appropriate, Distress- none acute Skin- rash-none, lesions- none, excoriation- none Lymphadenopathy- none Head- atraumatic            Eyes- Gross vision intact, PERRLA, conjunctivae clear secretions            Ears- Hearing, canals normal            Nose- Clear, no-Septal dev, mucus, polyps, erosion, perforation             Throat- Mallampati II , mucosa clear , drainage- none, tonsils- atrophic Neck- flexible , trachea midline, no stridor , thyroid nl, carotid no bruit Chest - symmetrical excursion ,  unlabored           Heart/CV- RRR , no murmur , no gallop  , no rub, nl s1 s2                           - JVD- none , edema- none, stasis changes- none, varices- none           Lung- clear to P&A, wheeze- none, cough- none , dullness-none, rub- none           Chest wall-  Abd-  Br/ Gen/ Rectal- Not done, not indicated Extrem- cyanosis- none, clubbing, none, atrophy- none, strength- nl Neuro- grossly intact to observation  Assessment & Plan:

## 2013-01-29 NOTE — Patient Instructions (Addendum)
Script to try ipratropium nasal spray   We can continue Allergy Vaccine at 1:50 with option to move up to 1:10 if not doing well enough. Can supplement with any otc antihistamine as needed.   Please call as needed

## 2013-02-03 NOTE — Assessment & Plan Note (Signed)
Continues allergy vaccine 1:50. Plan-consider advance to 1:10, depending on how she does this spring. Try ipratropium nasal spray for possible vasomotor rhinitis

## 2013-02-03 NOTE — Assessment & Plan Note (Signed)
Now mild, intermittent and well controlled. We will watch seasonal progression.

## 2013-02-06 ENCOUNTER — Ambulatory Visit: Payer: BC Managed Care – PPO

## 2013-02-11 ENCOUNTER — Encounter: Payer: Self-pay | Admitting: Internal Medicine

## 2013-02-15 ENCOUNTER — Ambulatory Visit (INDEPENDENT_AMBULATORY_CARE_PROVIDER_SITE_OTHER): Payer: BC Managed Care – PPO

## 2013-02-15 DIAGNOSIS — J309 Allergic rhinitis, unspecified: Secondary | ICD-10-CM

## 2013-02-18 ENCOUNTER — Other Ambulatory Visit: Payer: Self-pay | Admitting: Internal Medicine

## 2013-02-18 NOTE — Telephone Encounter (Signed)
Faxed hardcopy to pharmacy. 

## 2013-02-18 NOTE — Telephone Encounter (Signed)
Klonopin Done hardcopy to [facility], xanax declines as she should still have refills as per rx mar 2014

## 2013-02-21 ENCOUNTER — Ambulatory Visit (INDEPENDENT_AMBULATORY_CARE_PROVIDER_SITE_OTHER): Payer: BC Managed Care – PPO

## 2013-02-21 DIAGNOSIS — J309 Allergic rhinitis, unspecified: Secondary | ICD-10-CM

## 2013-02-27 ENCOUNTER — Ambulatory Visit: Payer: BC Managed Care – PPO

## 2013-03-11 ENCOUNTER — Ambulatory Visit (INDEPENDENT_AMBULATORY_CARE_PROVIDER_SITE_OTHER): Payer: BC Managed Care – PPO

## 2013-03-11 DIAGNOSIS — J309 Allergic rhinitis, unspecified: Secondary | ICD-10-CM

## 2013-04-15 ENCOUNTER — Ambulatory Visit (INDEPENDENT_AMBULATORY_CARE_PROVIDER_SITE_OTHER): Payer: BC Managed Care – PPO

## 2013-04-15 DIAGNOSIS — J309 Allergic rhinitis, unspecified: Secondary | ICD-10-CM

## 2013-04-24 ENCOUNTER — Ambulatory Visit: Payer: BC Managed Care – PPO

## 2013-05-02 ENCOUNTER — Ambulatory Visit (INDEPENDENT_AMBULATORY_CARE_PROVIDER_SITE_OTHER): Payer: BC Managed Care – PPO

## 2013-05-02 DIAGNOSIS — J309 Allergic rhinitis, unspecified: Secondary | ICD-10-CM

## 2013-05-17 ENCOUNTER — Ambulatory Visit: Payer: BC Managed Care – PPO

## 2013-05-22 ENCOUNTER — Ambulatory Visit: Payer: BC Managed Care – PPO

## 2013-05-29 ENCOUNTER — Other Ambulatory Visit: Payer: Self-pay | Admitting: Internal Medicine

## 2013-05-29 NOTE — Telephone Encounter (Signed)
[facility] to [facility] [address] [place] 

## 2013-05-29 NOTE — Telephone Encounter (Signed)
Done hardcopy to robin  

## 2013-06-18 ENCOUNTER — Other Ambulatory Visit: Payer: Self-pay | Admitting: Internal Medicine

## 2013-06-18 NOTE — Telephone Encounter (Signed)
Xanax Done hardcopy to [facility]  Need to hold on further phentermine as I believe has been d/w pt in the past, this is not normally a long term medication

## 2013-06-19 NOTE — Telephone Encounter (Signed)
Faxed [facility] to [facility] [address] [place] 

## 2013-06-21 ENCOUNTER — Ambulatory Visit (INDEPENDENT_AMBULATORY_CARE_PROVIDER_SITE_OTHER): Payer: BC Managed Care – PPO

## 2013-06-21 DIAGNOSIS — J309 Allergic rhinitis, unspecified: Secondary | ICD-10-CM

## 2013-06-28 ENCOUNTER — Ambulatory Visit (INDEPENDENT_AMBULATORY_CARE_PROVIDER_SITE_OTHER): Payer: BC Managed Care – PPO

## 2013-06-28 DIAGNOSIS — J309 Allergic rhinitis, unspecified: Secondary | ICD-10-CM

## 2013-07-05 ENCOUNTER — Ambulatory Visit (INDEPENDENT_AMBULATORY_CARE_PROVIDER_SITE_OTHER): Payer: BC Managed Care – PPO

## 2013-07-05 DIAGNOSIS — J309 Allergic rhinitis, unspecified: Secondary | ICD-10-CM

## 2013-07-12 ENCOUNTER — Ambulatory Visit: Payer: BC Managed Care – PPO

## 2013-07-19 ENCOUNTER — Ambulatory Visit (INDEPENDENT_AMBULATORY_CARE_PROVIDER_SITE_OTHER): Payer: BC Managed Care – PPO

## 2013-07-19 DIAGNOSIS — J309 Allergic rhinitis, unspecified: Secondary | ICD-10-CM

## 2013-07-24 ENCOUNTER — Ambulatory Visit: Payer: BC Managed Care – PPO

## 2013-08-16 ENCOUNTER — Ambulatory Visit (INDEPENDENT_AMBULATORY_CARE_PROVIDER_SITE_OTHER): Payer: BC Managed Care – PPO

## 2013-08-16 DIAGNOSIS — J309 Allergic rhinitis, unspecified: Secondary | ICD-10-CM

## 2013-08-22 ENCOUNTER — Ambulatory Visit (INDEPENDENT_AMBULATORY_CARE_PROVIDER_SITE_OTHER): Payer: BC Managed Care – PPO

## 2013-08-22 DIAGNOSIS — J309 Allergic rhinitis, unspecified: Secondary | ICD-10-CM

## 2013-08-23 ENCOUNTER — Ambulatory Visit: Payer: BC Managed Care – PPO

## 2013-08-28 ENCOUNTER — Other Ambulatory Visit: Payer: Self-pay | Admitting: Internal Medicine

## 2013-08-29 NOTE — Telephone Encounter (Signed)
[facility]ax[facility] [address]

## 2013-08-29 NOTE — Telephone Encounter (Signed)
Xanax too soon  Klonopin done hardcopy to [facility]

## 2013-08-30 ENCOUNTER — Ambulatory Visit (INDEPENDENT_AMBULATORY_CARE_PROVIDER_SITE_OTHER): Payer: BC Managed Care – PPO

## 2013-08-30 DIAGNOSIS — J309 Allergic rhinitis, unspecified: Secondary | ICD-10-CM

## 2013-09-06 ENCOUNTER — Ambulatory Visit (INDEPENDENT_AMBULATORY_CARE_PROVIDER_SITE_OTHER): Payer: BC Managed Care – PPO

## 2013-09-06 DIAGNOSIS — J309 Allergic rhinitis, unspecified: Secondary | ICD-10-CM

## 2013-09-09 DIAGNOSIS — Z23 Encounter for immunization: Secondary | ICD-10-CM

## 2013-09-10 ENCOUNTER — Ambulatory Visit (INDEPENDENT_AMBULATORY_CARE_PROVIDER_SITE_OTHER): Payer: BC Managed Care – PPO

## 2013-09-10 DIAGNOSIS — Z23 Encounter for immunization: Secondary | ICD-10-CM

## 2013-09-12 ENCOUNTER — Ambulatory Visit (INDEPENDENT_AMBULATORY_CARE_PROVIDER_SITE_OTHER): Payer: BC Managed Care – PPO

## 2013-09-12 ENCOUNTER — Telehealth: Payer: Self-pay | Admitting: Internal Medicine

## 2013-09-12 DIAGNOSIS — J309 Allergic rhinitis, unspecified: Secondary | ICD-10-CM

## 2013-09-12 NOTE — Telephone Encounter (Signed)
Ok to advance allergy vaccine to 1:10 here per protocol

## 2013-09-12 NOTE — Telephone Encounter (Signed)
Pt came in today for weekly allergy injection. She is currently on 1:50. In your last OV note you stated that we would increase her to 1:10. I used the last of her 1:50 serum today when giving her the injection. New serum will need to be mixed.  Would you like to increase her to 1:10 at this time?  Last OV 01/29/13 No pending OV

## 2013-09-12 NOTE — Telephone Encounter (Signed)
New rx for advanced allergy vaccine has been brought to the allergy lab. Vaccine will be made per protocol.

## 2013-09-13 ENCOUNTER — Ambulatory Visit (INDEPENDENT_AMBULATORY_CARE_PROVIDER_SITE_OTHER): Payer: BC Managed Care – PPO

## 2013-09-13 DIAGNOSIS — J309 Allergic rhinitis, unspecified: Secondary | ICD-10-CM

## 2013-09-25 ENCOUNTER — Ambulatory Visit (INDEPENDENT_AMBULATORY_CARE_PROVIDER_SITE_OTHER): Payer: BC Managed Care – PPO

## 2013-09-25 DIAGNOSIS — J309 Allergic rhinitis, unspecified: Secondary | ICD-10-CM

## 2013-10-18 ENCOUNTER — Ambulatory Visit (INDEPENDENT_AMBULATORY_CARE_PROVIDER_SITE_OTHER): Payer: BC Managed Care – PPO

## 2013-10-18 DIAGNOSIS — J309 Allergic rhinitis, unspecified: Secondary | ICD-10-CM

## 2013-12-03 ENCOUNTER — Other Ambulatory Visit: Payer: Self-pay | Admitting: Internal Medicine

## 2013-12-03 NOTE — Telephone Encounter (Signed)
Faxed [facility]copy to [facility] [address] [place] 

## 2013-12-03 NOTE — Telephone Encounter (Signed)
Done hardcopy to [facility]  Due for ROV mar 2015

## 2013-12-25 ENCOUNTER — Other Ambulatory Visit: Payer: Self-pay | Admitting: Internal Medicine

## 2013-12-25 NOTE — Telephone Encounter (Signed)
Called the patient left a detailed message of MD instructions.

## 2013-12-25 NOTE — Telephone Encounter (Signed)
Pt due for ROV

## 2013-12-31 ENCOUNTER — Ambulatory Visit: Payer: BC Managed Care – PPO | Admitting: Internal Medicine

## 2014-01-01 ENCOUNTER — Ambulatory Visit (INDEPENDENT_AMBULATORY_CARE_PROVIDER_SITE_OTHER): Payer: BC Managed Care – PPO | Admitting: Internal Medicine

## 2014-01-01 ENCOUNTER — Other Ambulatory Visit: Payer: Self-pay | Admitting: Internal Medicine

## 2014-01-01 ENCOUNTER — Encounter: Payer: Self-pay | Admitting: Internal Medicine

## 2014-01-01 VITALS — BP 100/60 | HR 77 | Temp 98.4°F | Wt 211.6 lb

## 2014-01-01 DIAGNOSIS — F4310 Post-traumatic stress disorder, unspecified: Secondary | ICD-10-CM

## 2014-01-01 DIAGNOSIS — Z0000 Encounter for general adult medical examination without abnormal findings: Secondary | ICD-10-CM

## 2014-01-01 MED ORDER — LORCASERIN HCL 10 MG PO TABS: 1.0000 | ORAL_TABLET | Freq: Two times a day (BID) | ORAL | Status: DC

## 2014-01-01 MED ORDER — CLONAZEPAM 1 MG PO TABS
ORAL_TABLET | ORAL | Status: DC
Start: 2014-01-01 — End: 2014-07-15

## 2014-01-01 MED ORDER — ALPRAZOLAM 0.25 MG PO TABS: ORAL_TABLET | ORAL | Status: DC

## 2014-01-01 NOTE — Progress Notes (Signed)
Subjective:    Patient ID: Laurie Solomon, female    DOB: [birth year], 43 y.o.   MRN: [id number]  HPI  Here for wellness and f/u;  Overall doing ok;  Pt denies CP, worsening SOB, DOE, wheezing, orthopnea, PND, worsening LE edema, palpitations, dizziness or syncope.  Pt denies neurological change such as new headache, facial or extremity weakness.  Pt denies polydipsia, polyuria, or low sugar symptoms. Pt states overall good compliance with treatment and medications, good tolerability, and has been trying to follow lower cholesterol diet.  Pt denies worsening depressive symptoms, suicidal ideation or panic. No fever, night sweats, wt loss, loss of appetite, or other constitutional symptoms.  Pt states good ability with ADL's, has low fall risk, home safety reviewed and adequate, no other significant changes in hearing or vision, and only occasionally active with exercise. Asks for belviq as phentermine did not work well for long term wt loss.  Has routine labs drawn by GYN, declines here. Last mammogram 2 yrs ago, but has f/u with GYN next wk as well.  Denies worsening depressive symptoms, suicidal ideation, or panic; has ongoing anxiety, not increased recently, though still stressful as husband s/p 2 lower back surguries, now seeing pain management.  Though unusual, has done well with only very occas low dose prn xanax, but more regular dosing clonazepam qhs. Past Medical History  Diagnosis Date  . Chronic insomnia   . Post traumatic stress disorder   . Headache(784.0)   . Asthma     childhood  . Allergic rhinitis    Past Surgical History  Procedure Laterality Date  . Refractive surgery      reports that she has never smoked. She has never used smokeless tobacco. She reports that she drinks about 1.0 ounces of alcohol per week. She reports that she does not use illicit drugs. family history includes Breast cancer in her other and other; Diabetes in her paternal grandfather; Heart disease in her  maternal grandfather; Hypertension in her maternal grandfather. No Known Allergies Current Outpatient Prescriptions on File Prior to Visit  Medication Sig Dispense Refill  . montelukast (SINGULAIR) 10 MG tablet TAKE 1 TABLET (10 MG TOTAL) BY MOUTH DAILY.  90 tablet  0  . sertraline (ZOLOFT) 100 MG tablet Take 100 mg by mouth daily.        Marland Kitchen albuterol (VENTOLIN HFA) 108 (90 BASE) MCG/ACT inhaler Inhale 1-2 puffs into the lungs every 6 (six) hours as needed.        Marland Kitchen ipratropium (ATROVENT) 0.06 % nasal spray 1-2 puffs each nostril up to 4 times daily as needed  15 mL  12  . mometasone (NASONEX) 50 MCG/ACT nasal spray 1-2 puffs in nostril once or twice daily  17 g  prn  . Multiple Vitamin (MULTIVITAMIN) tablet Take 1 tablet by mouth daily.        . phentermine 37.5 MG capsule Take 1 capsule (37.5 mg total) by mouth every morning.  30 capsule  2   No current facility-administered medications on file prior to visit.   Review of Systems Constitutional: Negative for diaphoresis, activity change, appetite change or unexpected weight change.  HENT: Negative for hearing loss, ear pain, facial swelling, mouth sores and neck stiffness.   Eyes: Negative for pain, redness and visual disturbance.  Respiratory: Negative for shortness of breath and wheezing.   Cardiovascular: Negative for chest pain and palpitations.  Gastrointestinal: Negative for diarrhea, blood in stool, abdominal distention or other pain Genitourinary: Negative for hematuria,  flank pain or change in urine volume.  Musculoskeletal: Negative for myalgias and joint swelling.  Skin: Negative for color change and wound.  Neurological: Negative for syncope and numbness. other than noted Hematological: Negative for adenopathy.  Psychiatric/Behavioral: Negative for hallucinations, self-injury, decreased concentration and agitation.      Objective:   Physical Exam BP 100/60  Pulse 77  Temp(Src) 98.4 F (36.9 C) (Oral)  Wt 211 lb 9.6 oz  (95.981 kg)  SpO2 97% VS noted,  Constitutional: Pt is oriented to person, place, and time. Appears well-developed and well-nourished./obese  Head: Normocephalic and atraumatic.  Right Ear: External ear normal.  Left Ear: External ear normal.  Nose: Nose normal.  Mouth/Throat: Oropharynx is clear and moist.  Eyes: Conjunctivae and EOM are normal. Pupils are equal, round, and reactive to light.  Neck: Normal range of motion. Neck supple. No JVD present. No tracheal deviation present.  Cardiovascular: Normal rate, regular rhythm, normal heart sounds and intact distal pulses.   Pulmonary/Chest: Effort normal and breath sounds normal.  Abdominal: Soft. Bowel sounds are normal. There is no tenderness. No HSM  Musculoskeletal: Normal range of motion. Exhibits no edema.  Lymphadenopathy:  Has no cervical adenopathy.  Neurological: Pt is alert and oriented to person, place, and time. Pt has normal reflexes. No cranial nerve deficit.  Skin: Skin is warm and dry. No rash noted.  Psychiatric:  Has  normal mood and affect. Behavior is normal.     Assessment & Plan:

## 2014-01-01 NOTE — Assessment & Plan Note (Addendum)
Overall doing well, age appropriate education and counseling updated, referrals for preventative services and immunizations addressed, dietary and smoking counseling addressed, most recent labs reviewed.  I have personally reviewed and have noted: 1) the patient's medical and social history 2) The pt's use of alcohol, tobacco, and illicit drugs 3) The patient's current medications and supplements 4) Functional ability including ADL's, fall risk, home safety risk, hearing and visual impairment 5) Diet and physical activities 6) Evidence for depression or mood disorder 7) The patient's height, weight, and BMI have been recorded in the chart I have made referrals, and provided counseling and education based on review of the above Declines labs, has f/u with GYN next for pap/mammogram

## 2014-01-01 NOTE — Assessment & Plan Note (Signed)
stable overall by history and exam, and pt to continue medical treatment as before,  to f/u any worsening symptoms or concerns 

## 2014-01-01 NOTE — Patient Instructions (Signed)
Please take all new medication as prescribed - the belviq Remember the 15 day free coupon online for the belviq Please continue all other medications as before, and refills have been done if requested. Please have the pharmacy call with any other refills you may need. Please continue your efforts at being more active, low cholesterol diet, and weight control. You are otherwise up to date with prevention measures today.  Please keep your appointments with your specialists as you have planned- GYN next week  Please remember to sign up for MyChart if you have not done so, as this will be important to you in the future with finding out test results, communicating by private email, and scheduling acute appointments online when needed.  Please return in 1 year for your yearly visit, or sooner if needed

## 2014-01-01 NOTE — Progress Notes (Signed)
Pre visit review using our clinic review tool, if applicable. No additional management support is needed unless otherwise documented below in the visit note. 

## 2014-01-01 NOTE — Assessment & Plan Note (Signed)
Ok for belviq trial,  to f/u any worsening symptoms or concerns  

## 2014-01-02 ENCOUNTER — Other Ambulatory Visit: Payer: Self-pay | Admitting: Internal Medicine

## 2014-03-10 ENCOUNTER — Telehealth: Payer: Self-pay | Admitting: *Deleted

## 2014-03-10 MED ORDER — ALPRAZOLAM 0.25 MG PO TABS: ORAL_TABLET | ORAL | Status: DC

## 2014-03-10 NOTE — Telephone Encounter (Signed)
Pharmacist called states pts Alprazolam Rx was filled incorrectly, filled 0.5mg  instead of 0.25mg .  Pharmacist requests whether dose should be changed back to 0.25 or keep it at 0.5mg .  Pt has been on incorrect dose for two months.  Please advise in Dr Laurie Solomon absence.

## 2014-03-10 NOTE — Telephone Encounter (Signed)
To change back to .25 mg  I have done another rx in case this is needed- Done hardcopy to robin

## 2014-03-10 NOTE — Telephone Encounter (Signed)
This will have to be addressed by Dr. Jenny Reichmann

## 2014-03-11 NOTE — Telephone Encounter (Signed)
Faxed hard[facility] to [facility] [address] [place] 

## 2014-07-15 ENCOUNTER — Other Ambulatory Visit: Payer: Self-pay | Admitting: Internal Medicine

## 2014-07-15 NOTE — Telephone Encounter (Signed)
Done hardcopy to robin - xanax  singulair done erx

## 2014-07-16 NOTE — Telephone Encounter (Signed)
Faxed hardcopy for clonazepam [facility] [address] [place]

## 2014-07-22 ENCOUNTER — Other Ambulatory Visit: Payer: Self-pay | Admitting: Internal Medicine

## 2014-07-23 NOTE — Telephone Encounter (Signed)
Done hardcopy to robin  

## 2014-07-23 NOTE — Telephone Encounter (Signed)
Faxed hardcopy for [facility]azepam to [facility] [place]

## 2014-08-22 ENCOUNTER — Other Ambulatory Visit: Payer: Self-pay

## 2014-09-10 ENCOUNTER — Encounter: Payer: Self-pay | Admitting: Internal Medicine

## 2014-09-29 ENCOUNTER — Other Ambulatory Visit: Payer: Self-pay | Admitting: Internal Medicine

## 2014-09-29 ENCOUNTER — Encounter: Payer: Self-pay | Admitting: Internal Medicine

## 2014-09-30 NOTE — Telephone Encounter (Signed)
Done hardcopy to robin  

## 2014-10-01 NOTE — Telephone Encounter (Signed)
Faxed hardcopy for [facility]prazolam to [facility] [address]

## 2014-11-11 ENCOUNTER — Other Ambulatory Visit: Payer: Self-pay | Admitting: Internal Medicine

## 2015-01-08 ENCOUNTER — Other Ambulatory Visit: Payer: Self-pay | Admitting: Internal Medicine

## 2015-01-09 NOTE — Telephone Encounter (Signed)
Done hardcopy to Laurie Solomon  

## 2015-01-09 NOTE — Telephone Encounter (Signed)
Rx done. 

## 2015-01-20 ENCOUNTER — Other Ambulatory Visit: Payer: Self-pay | Admitting: Internal Medicine

## 2015-01-21 NOTE — Telephone Encounter (Signed)
Done hardcopy to Laurie Solomon  

## 2015-02-17 ENCOUNTER — Other Ambulatory Visit: Payer: Self-pay | Admitting: Internal Medicine

## 2015-03-22 ENCOUNTER — Other Ambulatory Visit: Payer: Self-pay | Admitting: Internal Medicine

## 2015-03-24 NOTE — Telephone Encounter (Signed)
Done hardcopy to [name]  

## 2015-03-25 NOTE — Telephone Encounter (Signed)
Rx faxed to pharmacy  

## 2015-06-20 ENCOUNTER — Other Ambulatory Visit: Payer: Self-pay | Admitting: Internal Medicine

## 2015-06-24 ENCOUNTER — Other Ambulatory Visit: Payer: Self-pay | Admitting: Internal Medicine

## 2015-06-26 ENCOUNTER — Encounter: Payer: Self-pay | Admitting: Internal Medicine

## 2015-07-06 ENCOUNTER — Other Ambulatory Visit: Payer: Self-pay | Admitting: Internal Medicine

## 2015-07-08 ENCOUNTER — Ambulatory Visit: Payer: BC Managed Care – PPO | Admitting: Internal Medicine

## 2015-07-09 ENCOUNTER — Ambulatory Visit (INDEPENDENT_AMBULATORY_CARE_PROVIDER_SITE_OTHER): Payer: BC Managed Care – PPO | Admitting: Internal Medicine

## 2015-07-09 ENCOUNTER — Encounter: Payer: Self-pay | Admitting: Internal Medicine

## 2015-07-09 VITALS — BP 118/76 | HR 90 | Temp 98.7°F | Ht 67.0 in | Wt 218.0 lb

## 2015-07-09 DIAGNOSIS — E559 Vitamin D deficiency, unspecified: Secondary | ICD-10-CM

## 2015-07-09 DIAGNOSIS — E039 Hypothyroidism, unspecified: Secondary | ICD-10-CM

## 2015-07-09 DIAGNOSIS — Z0000 Encounter for general adult medical examination without abnormal findings: Secondary | ICD-10-CM | POA: Diagnosis not present

## 2015-07-09 DIAGNOSIS — Z23 Encounter for immunization: Secondary | ICD-10-CM

## 2015-07-09 HISTORY — DX: Vitamin D deficiency, unspecified: E55.9

## 2015-07-09 HISTORY — DX: Hypothyroidism, unspecified: E03.9

## 2015-07-09 MED ORDER — ALPRAZOLAM 0.25 MG PO TABS: 0.2500 mg | ORAL_TABLET | Freq: Every day | ORAL | Status: DC | PRN

## 2015-07-09 MED ORDER — MONTELUKAST SODIUM 10 MG PO TABS: 10.0000 mg | ORAL_TABLET | Freq: Every day | ORAL | Status: DC

## 2015-07-09 MED ORDER — CLONAZEPAM 1 MG PO TABS: 1.0000 mg | ORAL_TABLET | Freq: Every evening | ORAL | Status: DC | PRN

## 2015-07-09 NOTE — Progress Notes (Signed)
Pre visit review using our clinic review tool, if applicable. No additional management support is needed unless otherwise documented below in the visit note. 

## 2015-07-09 NOTE — Patient Instructions (Addendum)
You had the flu shot today  Please continue all other medications as before, and refills have been done if requested.  Please have the pharmacy call with any other refills you may need.  Please continue your efforts at being more active, low cholesterol diet, and weight control.  You are otherwise up to date with prevention measures today.  Please keep your appointments with your specialists as you may have planned  Please return in 1 year for your yearly visit, or sooner if needed 

## 2015-07-09 NOTE — Progress Notes (Signed)
Subjective:    Patient ID: Laurie Solomon, female    DOB: [birth year], 44 y.o.   MRN: [id number]  HPI  Here for wellness and f/u;  Overall doing ok;  Pt denies Chest pain, worsening SOB, DOE, wheezing, orthopnea, PND, worsening LE edema, palpitations, dizziness or syncope.  Pt denies neurological change such as new headache, facial or extremity weakness.  Pt denies polydipsia, polyuria, or low sugar symptoms. Pt states overall good compliance with treatment and medications, good tolerability, and has been trying to follow appropriate diet.  Pt denies worsening depressive symptoms, suicidal ideation or panic. No fever, night sweats, wt loss, loss of appetite, or other constitutional symptoms.  Pt states good ability with ADL's, has low fall risk, home safety reviewed and adequate, no other significant changes in hearing or vision, and only occasionally active with exercise. No current complaints Hard to lose wt, Has Qsymia rx per GYN, not started yet. Past Medical History  Diagnosis Date  . Chronic insomnia   . Post traumatic stress disorder   . Headache(784.0)   . Asthma     childhood  . Allergic rhinitis   . Hypothyroidism 07/09/2015  . Vitamin D deficiency 07/09/2015   Past Surgical History  Procedure Laterality Date  . Refractive surgery      reports that she has never smoked. She has never used smokeless tobacco. She reports that she drinks about 1.0 oz of alcohol per week. She reports that she does not use illicit drugs. family history includes Breast cancer in her other and other; Diabetes in her paternal grandfather; Heart disease in her maternal grandfather; Hypertension in her maternal grandfather. No Known Allergies Current Outpatient Prescriptions on File Prior to Visit  Medication Sig Dispense Refill  . Multiple Vitamin (MULTIVITAMIN) tablet Take 1 tablet by mouth daily.      . NONFORMULARY OR COMPOUNDED ITEM Allergy Vaccine 1:10 Given at [facility]    . sertraline  (ZOLOFT) 100 MG tablet Take 100 mg by mouth daily.      Marland Kitchen albuterol (VENTOLIN HFA) 108 (90 BASE) MCG/ACT inhaler Inhale 1-2 puffs into the lungs every 6 (six) hours as needed.       No current facility-administered medications on file prior to visit.    Review of Systems Constitutional: Negative for increased diaphoresis, other activity, appetite or siginficant weight change other than noted HENT: Negative for worsening hearing loss, ear pain, facial swelling, mouth sores and neck stiffness.   Eyes: Negative for other worsening pain, redness or visual disturbance.  Respiratory: Negative for shortness of breath and wheezing  Cardiovascular: Negative for chest pain and palpitations.  Gastrointestinal: Negative for diarrhea, blood in stool, abdominal distention or other pain Genitourinary: Negative for hematuria, flank pain or change in urine volume.  Musculoskeletal: Negative for myalgias or other joint complaints.  Skin: Negative for color change and wound or drainage.  Neurological: Negative for syncope and numbness. other than noted Hematological: Negative for adenopathy. or other swelling Psychiatric/Behavioral: Negative for hallucinations, SI, self-injury, decreased concentration or other worsening agitation.      Objective:   Physical Exam BP 118/76 mmHg  Pulse 90  Temp(Src) 98.7 F (37.1 C) (Oral)  Ht 5\' 7"  (1.702 m)  Wt 218 lb (98.884 kg)  BMI 34.14 kg/m2  SpO2 96% VS noted,  Constitutional: Pt is oriented to person, place, and time. Appears well-developed and well-nourished, in no significant distress Head: Normocephalic and atraumatic.  Right Ear: External ear normal.  Left Ear: External ear normal.  Nose: Nose normal.  Mouth/Throat: Oropharynx is clear and moist.  Eyes: Conjunctivae and EOM are normal. Pupils are equal, round, and reactive to light.  Neck: Normal range of motion. Neck supple. No JVD present. No tracheal deviation present or significant neck LA or  mass Cardiovascular: Normal rate, regular rhythm, normal heart sounds and intact distal pulses.   Pulmonary/Chest: Effort normal and breath sounds without rales or wheezing  Abdominal: Soft. Bowel sounds are normal. NT. No HSM  Musculoskeletal: Normal range of motion. Exhibits no edema.  Lymphadenopathy:  Has no cervical adenopathy.  Neurological: Pt is alert and oriented to person, place, and time. Pt has normal reflexes. No cranial nerve deficit. Motor grossly intact Skin: Skin is warm and dry. No rash noted.  Psychiatric:  Has normal mood and affect. Behavior is normal.     Assessment & Plan:

## 2015-07-09 NOTE — Assessment & Plan Note (Signed)
Overall doing well, age appropriate education and counseling updated, referrals for preventative services and immunizations addressed, dietary and smoking counseling addressed, most recent labs reviewed.  I have personally reviewed and have noted:  1) the patient's medical and social history 2) The pt's use of alcohol, tobacco, and illicit drugs 3) The patient's current medications and supplements 4) Functional ability including ADL's, fall risk, home safety risk, hearing and visual impairment 5) Diet and physical activities 6) Evidence for depression or mood disorder 7) The patient's height, weight, and BMI have been recorded in the chart  I have made referrals, and provided counseling and education based on review of the above Has labs per GYN, will have then forwarded

## 2015-07-10 ENCOUNTER — Other Ambulatory Visit: Payer: Self-pay | Admitting: Internal Medicine

## 2015-07-15 ENCOUNTER — Other Ambulatory Visit: Payer: Self-pay | Admitting: Internal Medicine

## 2015-07-16 ENCOUNTER — Telehealth: Payer: Self-pay | Admitting: Internal Medicine

## 2015-07-16 NOTE — Telephone Encounter (Signed)
Patient was in last week and the prescriptions for ALPRAZolam (XANAX) 0.25 MG tablet [[id number] and clonazePAM (KLONOPIN) 1 MG tablet [[id number] didn't get to pharmacy.  Pharmacy requested the refills yesterday and were denied. [facility] [address]

## 2015-07-16 NOTE — Telephone Encounter (Signed)
Pt advised via VM that she was given written Rxs at [facility] 07/09/2015

## 2015-11-28 ENCOUNTER — Other Ambulatory Visit: Payer: Self-pay | Admitting: Internal Medicine

## 2015-11-30 NOTE — Telephone Encounter (Signed)
Please advise, thanks.

## 2015-12-01 ENCOUNTER — Telehealth: Payer: Self-pay

## 2015-12-01 NOTE — Telephone Encounter (Signed)
Done hardcopy to [facility]

## 2015-12-01 NOTE — Telephone Encounter (Signed)
Medication printed signed and faxed. 12/01/2015

## 2016-03-21 ENCOUNTER — Other Ambulatory Visit: Payer: Self-pay | Admitting: Internal Medicine

## 2016-03-22 NOTE — Telephone Encounter (Signed)
Medication faxed to pharmacy 

## 2016-03-22 NOTE — Telephone Encounter (Signed)
Done hardcopy to Corinne  

## 2016-03-22 NOTE — Telephone Encounter (Signed)
Please advise 

## 2016-04-23 ENCOUNTER — Other Ambulatory Visit: Payer: Self-pay | Admitting: Internal Medicine

## 2016-04-25 NOTE — Telephone Encounter (Signed)
[facility]...[facility]lmb 

## 2016-04-25 NOTE — Telephone Encounter (Signed)
MD is out of the office until Wed 04/27/16 pls advise on refill...Johny Chess

## 2016-05-31 ENCOUNTER — Other Ambulatory Visit: Payer: Self-pay | Admitting: Family

## 2016-05-31 NOTE — Telephone Encounter (Signed)
Medication refill sent to pharmacy  

## 2016-05-31 NOTE — Telephone Encounter (Signed)
Done hardcopy to Corinne  

## 2016-07-11 ENCOUNTER — Other Ambulatory Visit: Payer: Self-pay | Admitting: Internal Medicine

## 2016-09-05 ENCOUNTER — Other Ambulatory Visit: Payer: Self-pay | Admitting: Internal Medicine

## 2016-09-23 ENCOUNTER — Telehealth: Payer: Self-pay

## 2016-09-23 MED ORDER — ALPRAZOLAM 0.25 MG PO TABS: 0.2500 mg | ORAL_TABLET | Freq: Every day | ORAL | 1 refills | Status: DC | PRN

## 2016-09-23 NOTE — Telephone Encounter (Signed)
Pt rq rf of alprazolam

## 2016-09-23 NOTE — Telephone Encounter (Signed)
Done hardcopy to Corinne  Please contact pt - due for yearly visit for further refills

## 2016-09-27 NOTE — Telephone Encounter (Signed)
Prescription faxed. Left message for patient regarding OV for further refills.

## 2016-10-13 ENCOUNTER — Encounter: Payer: Self-pay | Admitting: Internal Medicine

## 2016-10-13 ENCOUNTER — Ambulatory Visit (INDEPENDENT_AMBULATORY_CARE_PROVIDER_SITE_OTHER): Payer: BC Managed Care – PPO | Admitting: Internal Medicine

## 2016-10-13 VITALS — BP 130/70 | HR 75 | Temp 98.9°F | Resp 20 | Wt 224.0 lb

## 2016-10-13 DIAGNOSIS — J4521 Mild intermittent asthma with (acute) exacerbation: Secondary | ICD-10-CM | POA: Diagnosis not present

## 2016-10-13 DIAGNOSIS — R059 Cough, unspecified: Secondary | ICD-10-CM | POA: Insufficient documentation

## 2016-10-13 DIAGNOSIS — R05 Cough: Secondary | ICD-10-CM | POA: Insufficient documentation

## 2016-10-13 DIAGNOSIS — Z23 Encounter for immunization: Secondary | ICD-10-CM

## 2016-10-13 DIAGNOSIS — J45901 Unspecified asthma with (acute) exacerbation: Secondary | ICD-10-CM | POA: Insufficient documentation

## 2016-10-13 DIAGNOSIS — R0683 Snoring: Secondary | ICD-10-CM | POA: Insufficient documentation

## 2016-10-13 MED ORDER — HYDROCODONE-HOMATROPINE 5-1.5 MG/5ML PO SYRP
5.0000 mL | ORAL_SOLUTION | Freq: Four times a day (QID) | ORAL | 0 refills | Status: AC | PRN
Start: 2016-10-13 — End: 2016-10-23

## 2016-10-13 MED ORDER — AZITHROMYCIN 250 MG PO TABS: ORAL_TABLET | ORAL | 1 refills | Status: DC

## 2016-10-13 MED ORDER — ALBUTEROL SULFATE HFA 108 (90 BASE) MCG/ACT IN AERS: 1.0000 | INHALATION_SPRAY | Freq: Four times a day (QID) | RESPIRATORY_TRACT | 5 refills | Status: DC | PRN

## 2016-10-13 MED ORDER — PREDNISONE 10 MG PO TABS: ORAL_TABLET | ORAL | 0 refills | Status: DC

## 2016-10-13 NOTE — Progress Notes (Signed)
Pre visit review using our clinic review tool, if applicable. No additional management support is needed unless otherwise documented below in the visit note. 

## 2016-10-13 NOTE — Patient Instructions (Signed)
You had the steroid shot today  Please take all new medication as prescribed - the antibiotic, cough medicine if needed, and short course of prednisone  Please continue all other medications as before, and refills have been done if requested.  Please have the pharmacy call with any other refills you may need.  Please keep your appointments with your specialists as you may have planned

## 2016-10-13 NOTE — Progress Notes (Signed)
   Subjective:    Patient ID: Laurie Solomon, female    DOB: December 08, 1970, 45 y.o.   MRN: [id number]  HPI  Here with acute onset mild to mod 2 wks, ST, HA, general weakness and malaise, with prod cough greenish sputum, but Pt denies chest pain, increased sob or doe, wheezing, orthopnea, PND, increased LE swelling, palpitations, dizziness or syncope, except for mild wheezing and sob, gradually worsening now for 2 wks as well.  Cough keeps her up at night.  Pt denies polydipsia, polyuria, Past Medical History:  Diagnosis Date  . Allergic rhinitis   . Asthma    childhood  . Chronic insomnia   . Headache(784.0)   . Hypothyroidism 07/09/2015  . Post traumatic stress disorder   . Vitamin D deficiency 07/09/2015   Past Surgical History:  Procedure Laterality Date  . REFRACTIVE SURGERY      reports that she has never smoked. She has never used smokeless tobacco. She reports that she drinks about 1.0 oz of alcohol per week . She reports that she does not use drugs. family history includes Breast cancer in her other and other; Diabetes in her paternal grandfather; Heart disease in her maternal grandfather; Hypertension in her maternal grandfather. No Known Allergies Current Outpatient Prescriptions on File Prior to Visit  Medication Sig Dispense Refill  . ALPRAZolam (XANAX) 0.25 MG tablet Take 1 tablet (0.25 mg total) by mouth daily as needed. 30 tablet 1  . levothyroxine (SYNTHROID, LEVOTHROID) 25 MCG tablet     . montelukast (SINGULAIR) 10 MG tablet TAKE 1 TABLET (10 MG TOTAL) BY MOUTH AT BEDTIME. 90 tablet 3  . Multiple Vitamin (MULTIVITAMIN) tablet Take 1 tablet by mouth daily.      . NONFORMULARY OR COMPOUNDED ITEM Allergy Vaccine 1:10 Given at [facility]    . sertraline (ZOLOFT) 100 MG tablet Take 100 mg by mouth daily.       No current facility-administered medications on file prior to visit.    Review of Systems   All other system neg per pt    Objective:   Physical Exam BP  130/70   Pulse 75   Temp 98.9 F (37.2 C) (Oral)   Resp 20   Wt 224 lb (101.6 kg)   SpO2 96%   BMI 35.08 kg/m  VS noted, mild ill appaering Constitutional: Pt appears in no apparent distress HENT: Head: NCAT.  Right Ear: External ear normal.  Left Ear: External ear normal.  Eyes: . Pupils are equal, round, and reactive to light. Conjunctivae and EOM are normal Neck: Normal range of motion. Neck supple.  Cardiovascular: Normal rate and regular rhythm.   Pulmonary/Chest: Effort normal and breath sounds decreased without rales but with mild scattered wheezing.  Neurological: Pt is alert. Not confused , motor grossly intact Skin: Skin is warm. No rash, no LE edema Psychiatric: Pt behavior is normal. No agitation.      Assessment & Plan:

## 2016-10-14 ENCOUNTER — Ambulatory Visit: Payer: BC Managed Care – PPO | Admitting: Internal Medicine

## 2016-10-15 NOTE — Assessment & Plan Note (Signed)
Mild to mod, for predpac asd, depomedrol 80 IM,  to f/u any worsening symptoms or concerns

## 2016-10-15 NOTE — Assessment & Plan Note (Signed)
Mild to mod, c/w bronchitis vs pna, decliens cxr, for antibx course, cough med prn, to f/u any worsening symptoms or concerns 

## 2016-11-27 ENCOUNTER — Other Ambulatory Visit: Payer: Self-pay | Admitting: Internal Medicine

## 2016-11-28 NOTE — Telephone Encounter (Signed)
Routing to dr john, please advise, thanks 

## 2016-11-29 NOTE — Telephone Encounter (Signed)
faxed

## 2016-11-29 NOTE — Telephone Encounter (Signed)
Done hardcopy to Corinne  

## 2017-02-27 ENCOUNTER — Other Ambulatory Visit: Payer: Self-pay | Admitting: Internal Medicine

## 2017-02-27 ENCOUNTER — Telehealth: Payer: Self-pay | Admitting: Internal Medicine

## 2017-02-27 NOTE — Telephone Encounter (Signed)
Pt would like refill of ALPRAZolam (XANAX) 0.25 MG tablet    [facility] [address]  Does she need an appt to get this refilled?

## 2017-02-27 NOTE — Telephone Encounter (Signed)
Refill was denied this am. Pt has not seen MD since Dec and ov was for acute problem. Pt is need to make CPX appt w/Dr. Jenny Reichmann pls call to schedule....Laurie Solomon

## 2017-03-03 ENCOUNTER — Ambulatory Visit: Payer: BC Managed Care – PPO | Admitting: Internal Medicine

## 2017-03-08 ENCOUNTER — Ambulatory Visit: Payer: BC Managed Care – PPO | Admitting: Internal Medicine

## 2017-03-09 ENCOUNTER — Encounter: Payer: Self-pay | Admitting: Internal Medicine

## 2017-03-09 ENCOUNTER — Ambulatory Visit (INDEPENDENT_AMBULATORY_CARE_PROVIDER_SITE_OTHER): Payer: BC Managed Care – PPO | Admitting: Internal Medicine

## 2017-03-09 VITALS — BP 108/68 | HR 85 | Ht 68.0 in | Wt 229.0 lb

## 2017-03-09 DIAGNOSIS — Z0000 Encounter for general adult medical examination without abnormal findings: Secondary | ICD-10-CM

## 2017-03-09 MED ORDER — ALPRAZOLAM 0.25 MG PO TABS: 0.2500 mg | ORAL_TABLET | Freq: Every day | ORAL | 5 refills | Status: DC | PRN

## 2017-03-09 NOTE — Patient Instructions (Signed)
OK to consider the Victoza, Trulicity, Byetta or Bydureon for help with wt loss  Please continue all other medications as before, and refills have been done if requested.  Please have the pharmacy call with any other refills you may need.  Please continue your efforts at being more active, low cholesterol diet, and weight control.  You are otherwise up to date with prevention measures today.  Please keep your appointments with your specialists as you may have planned  Please return in 1 year for your yearly visit, or sooner if needed

## 2017-03-09 NOTE — Progress Notes (Signed)
Subjective:    Patient ID: Laurie Solomon, female    DOB: 1971-08-03, 46 y.o.   MRN: [id number]  HPI  Here for wellness and f/u;  Overall doing ok;  Pt denies Chest pain, worsening SOB, DOE, wheezing, orthopnea, PND, worsening LE edema, palpitations, dizziness or syncope.  Pt denies neurological change such as new headache, facial or extremity weakness.  Pt denies polydipsia, polyuria, or low sugar symptoms. Pt states overall good compliance with treatment and medications, good tolerability, and has been trying to follow appropriate diet.  Pt denies worsening depressive symptoms, suicidal ideation or panic. No fever, night sweats, wt loss, loss of appetite, or other constitutional symptoms.  Pt states good ability with ADL's, has low fall risk, home safety reviewed and adequate, no other significant changes in hearing or vision, and states she is active with exercise most days of the week but cannot seem to lose wt.  States had OK glc and thyroid testing per GYN per pt.  Does not want further labs today.   Past Medical History:  Diagnosis Date  . Allergic rhinitis   . Asthma    childhood  . Chronic insomnia   . Headache(784.0)   . Hypothyroidism 07/09/2015  . Post traumatic stress disorder   . Vitamin D deficiency 07/09/2015   Past Surgical History:  Procedure Laterality Date  . REFRACTIVE SURGERY      reports that she has never smoked. She has never used smokeless tobacco. She reports that she drinks about 1.0 oz of alcohol per week . She reports that she does not use drugs. family history includes Breast cancer in her other and other; Diabetes in her paternal grandfather; Heart disease in her maternal grandfather; Hypertension in her maternal grandfather. No Known Allergies Current Outpatient Prescriptions on File Prior to Visit  Medication Sig Dispense Refill  . albuterol (VENTOLIN HFA) 108 (90 Base) MCG/ACT inhaler Inhale 1-2 puffs into the lungs every 6 (six) hours as needed. 1 Inhaler 5    . levothyroxine (SYNTHROID, LEVOTHROID) 25 MCG tablet     . montelukast (SINGULAIR) 10 MG tablet TAKE 1 TABLET (10 MG TOTAL) BY MOUTH AT BEDTIME. 90 tablet 3  . Multiple Vitamin (MULTIVITAMIN) tablet Take 1 tablet by mouth daily.      . NONFORMULARY OR COMPOUNDED ITEM Allergy Vaccine 1:10 Given at [facility]    . sertraline (ZOLOFT) 100 MG tablet Take 100 mg by mouth daily.       No current facility-administered medications on file prior to visit.    Review of Systems Constitutional: Negative for other unusual diaphoresis, sweats, appetite or weight changes HENT: Negative for other worsening hearing loss, ear pain, facial swelling, mouth sores or neck stiffness.   Eyes: Negative for other worsening pain, redness or other visual disturbance.  Respiratory: Negative for other stridor or swelling Cardiovascular: Negative for other palpitations or other chest pain  Gastrointestinal: Negative for worsening diarrhea or loose stools, blood in stool, distention or other pain Genitourinary: Negative for hematuria, flank pain or other change in urine volume.  Musculoskeletal: Negative for myalgias or other joint swelling.  Skin: Negative for other color change, or other wound or worsening drainage.  Neurological: Negative for other syncope or numbness. Hematological: Negative for other adenopathy or swelling Psychiatric/Behavioral: Negative for hallucinations, other worsening agitation, SI, self-injury, or new decreased concentration All other system neg per pt    Objective:   Physical Exam BP 108/68   Pulse 85   Ht 5\' 8"  (1.727 m)  Wt 229 lb (103.9 kg)   SpO2 99%   BMI 34.82 kg/m  VS noted, not ill appearing, obese Constitutional: Pt is oriented to person, place, and time. Appears well-developed and well-nourished, in no significant distress and comfortable Head: Normocephalic and atraumatic  Eyes: Conjunctivae and EOM are normal. Pupils are equal, round, and reactive to  light Right Ear: External ear normal without discharge Left Ear: External ear normal without discharge Nose: Nose without discharge or deformity Mouth/Throat: Oropharynx is without other ulcerations and moist  Neck: Normal range of motion. Neck supple. No JVD present. No tracheal deviation present or significant neck LA or mass Cardiovascular: Normal rate, regular rhythm, normal heart sounds and intact distal pulses.   Pulmonary/Chest: WOB normal and breath sounds without rales or wheezing  Abdominal: Soft. Bowel sounds are normal. NT. No HSM  Musculoskeletal: Normal range of motion. Exhibits no edema Lymphadenopathy: Has no other cervical adenopathy.  Neurological: Pt is alert and oriented to person, place, and time. Pt has normal reflexes. No cranial nerve deficit. Motor grossly intact, Gait intact Skin: Skin is warm and dry. No rash noted or new ulcerations Psychiatric:  Has normal mood and affect. Behavior is normal without agitation No other exam findings    Assessment & Plan:

## 2017-03-09 NOTE — Progress Notes (Signed)
Pre visit review using our clinic review tool, if applicable. No additional management support is needed unless otherwise documented below in the visit note. 

## 2017-03-10 ENCOUNTER — Ambulatory Visit: Payer: BC Managed Care – PPO | Admitting: Internal Medicine

## 2017-03-12 NOTE — Assessment & Plan Note (Signed)

## 2017-03-12 NOTE — Assessment & Plan Note (Signed)
Difficult to lose, not ready to consider bariatric referral, will cont diet and exercise but also consider med such as victoza, pt will call to let us know

## 2017-07-07 ENCOUNTER — Other Ambulatory Visit: Payer: Self-pay | Admitting: Internal Medicine

## 2017-07-07 NOTE — Telephone Encounter (Signed)
Faxed

## 2017-07-07 NOTE — Telephone Encounter (Signed)
Done hardcopy to Shirron  

## 2017-08-07 ENCOUNTER — Other Ambulatory Visit: Payer: Self-pay | Admitting: Internal Medicine

## 2017-09-08 ENCOUNTER — Telehealth: Payer: BC Managed Care – PPO | Admitting: Family

## 2017-09-08 DIAGNOSIS — J029 Acute pharyngitis, unspecified: Secondary | ICD-10-CM

## 2017-09-08 MED ORDER — PREDNISONE 5 MG PO TABS: 5.0000 mg | ORAL_TABLET | ORAL | 0 refills | Status: DC

## 2017-09-08 NOTE — Progress Notes (Signed)
Thank you for the details you included in the comment boxes. Those details are very helpful in determining the best course of treatment for you and help [place] to provide the best care.  E visit for Flu like symptoms   We are sorry that you are not feeling well.  Here is how we plan to help! Based on what you have shared with me it looks like you may have flu-like symptoms that should be watched but do not seem to indicate anti-viral treatment.  Due to your throat swelling, I have added a prednisone pack to help with this.   Influenza or "the flu" is   an infection caused by a respiratory virus. The flu virus is highly contagious and persons who did not receive their yearly flu vaccination may "catch" the flu from close contact.  We have anti-viral medications to treat the viruses that cause this infection. They are not a "cure" and only shorten the course of the infection. These prescriptions are most effective when they are given within the first 2 days of "flu" symptoms. Antiviral medication are indicated if you have a high risk of complications from the flu. You should  also consider an antiviral medication if you are in close contact with someone who is at risk. These medications can help patients avoid complications from the flu  but have side effects that you should know. Possible side effects from Tamiflu or oseltamivir include nausea, vomiting, diarrhea, dizziness, headaches, eye redness, sleep problems or other respiratory symptoms. You should not take Tamiflu if you have an allergy to oseltamivir or any to the ingredients in Tamiflu.  Based upon your symptoms and potential risk factors   ANYONE WHO HAS FLU SYMPTOMS SHOULD: . Stay home. The flu is highly contagious and going out or to work exposes others! . Be sure to drink plenty of fluids. Water is fine as well as fruit juices, sodas and electrolyte beverages. You may want to stay away from caffeine or alcohol. If you are nauseated, try  taking small sips of liquids. How do you know if you are getting enough fluid? Your urine should be a pale yellow or almost colorless. . Get rest. . Taking a steamy shower or using a humidifier may help nasal congestion and ease sore throat pain. Using a saline nasal spray works much the same way. . Cough drops, hard candies and sore throat lozenges may ease your cough. . Line up a caregiver. Have someone check on you regularly.   GET HELP RIGHT AWAY IF: . You cannot keep down liquids or your medications. . You become short of breath . Your fell like you are going to pass out or loose consciousness. . Your symptoms persist after you have completed your treatment plan MAKE SURE YOU   Understand these instructions.  Will watch your condition.  Will get help right away if you are not doing well or get worse.  Your e-visit answers were reviewed by a board certified advanced clinical practitioner to complete your personal care plan.  Depending on the condition, your plan could have included both over the counter or prescription medications.  If there is a problem please reply  once you have received a response from your provider.  Your safety is important to [place].  If you have drug allergies check your prescription carefully.    You can use MyChart to ask questions about today's visit, request a non-urgent call back, or ask for a work or school excuse for  24 hours related to this e-Visit. If it has been greater than 24 hours you will need to follow up with your provider, or enter a new e-Visit to address those concerns.  You will get an e-mail in the next two days asking about your experience.  I hope that your e-visit has been valuable and will speed your recovery. Thank you for using e-visits.

## 2017-10-04 ENCOUNTER — Other Ambulatory Visit: Payer: Self-pay | Admitting: Internal Medicine

## 2017-10-05 NOTE — Telephone Encounter (Signed)
Done erx 

## 2017-12-05 ENCOUNTER — Telehealth: Payer: Self-pay | Admitting: Internal Medicine

## 2017-12-05 NOTE — Telephone Encounter (Signed)
Copied from [place]. Topic: Quick Communication - See Telephone Encounter >> Dec 05, 2017 12:05 PM Bea Graff, NT wrote: CRM for notification. See Telephone encounter for: Pt calling to speak with Dr. Judi Cong nurse and would like to discuss some weight loss medications she discussed with Dr. Jenny Reichmann last time she was in. She would like to try Saxenda, and her insurance plan does cover this medication. Uses [facility] on [address].  12/05/17.

## 2017-12-12 MED ORDER — LIRAGLUTIDE -WEIGHT MANAGEMENT 18 MG/3ML ~~LOC~~ SOPN: PEN_INJECTOR | SUBCUTANEOUS | 1 refills | Status: DC

## 2017-12-12 NOTE — Telephone Encounter (Signed)
Pt is calling back about this. She would like a call back today. [phone])

## 2017-12-12 NOTE — Telephone Encounter (Signed)
Done hardcopy to Shirron  

## 2017-12-13 ENCOUNTER — Telehealth: Payer: Self-pay | Admitting: Internal Medicine

## 2017-12-13 MED ORDER — LIRAGLUTIDE -WEIGHT MANAGEMENT 18 MG/3ML ~~LOC~~ SOPN: PEN_INJECTOR | SUBCUTANEOUS | 1 refills | Status: AC

## 2017-12-13 NOTE — Telephone Encounter (Signed)
Do you still have this? I haven't seen it on my desk. Please advise.

## 2017-12-13 NOTE — Telephone Encounter (Signed)
Faxed

## 2017-12-13 NOTE — Telephone Encounter (Signed)
Saxenda done hardcopy to shirron  Shirron to let pt know, thanks

## 2018-02-03 ENCOUNTER — Other Ambulatory Visit: Payer: Self-pay | Admitting: Internal Medicine

## 2018-03-05 ENCOUNTER — Encounter: Payer: Self-pay | Admitting: Internal Medicine

## 2018-03-05 MED ORDER — TYPHOID VACCINE PO CPDR: 1.0000 | DELAYED_RELEASE_CAPSULE | ORAL | 0 refills | Status: DC

## 2018-03-05 NOTE — Telephone Encounter (Signed)
Staff to contact pt - needs nurse visit for Tdap x 1, and Hep A series  - at her convenience

## 2018-03-07 ENCOUNTER — Ambulatory Visit (INDEPENDENT_AMBULATORY_CARE_PROVIDER_SITE_OTHER): Payer: BC Managed Care – PPO

## 2018-03-07 DIAGNOSIS — Z23 Encounter for immunization: Secondary | ICD-10-CM | POA: Diagnosis not present

## 2018-03-07 DIAGNOSIS — Z299 Encounter for prophylactic measures, unspecified: Secondary | ICD-10-CM

## 2018-03-16 ENCOUNTER — Other Ambulatory Visit: Payer: Self-pay | Admitting: Internal Medicine

## 2018-03-24 ENCOUNTER — Other Ambulatory Visit: Payer: Self-pay | Admitting: Internal Medicine

## 2018-03-26 NOTE — Telephone Encounter (Signed)
Done erx 

## 2018-03-27 ENCOUNTER — Other Ambulatory Visit: Payer: Self-pay | Admitting: Internal Medicine

## 2018-03-27 MED ORDER — LIRAGLUTIDE -WEIGHT MANAGEMENT 18 MG/3ML ~~LOC~~ SOPN: 3.0000 mg | PEN_INJECTOR | Freq: Every day | SUBCUTANEOUS | 1 refills | Status: DC

## 2018-04-09 ENCOUNTER — Telehealth: Payer: Self-pay | Admitting: Internal Medicine

## 2018-04-09 NOTE — Telephone Encounter (Signed)
Copied from [place] [phone]. Topic: Quick Communication - See Telephone Encounter >> Apr 09, 2018  5:03 PM Rutherford Nail, NT wrote: CRM for notification. See Telephone encounter for: 04/09/18. Sammy with [employer] calling and in regards to the prior authorization for saxenda. States there was an error and she was calling to assist in bypassing the error message. CB#: [phone] Refereance key: [id number]

## 2018-04-21 ENCOUNTER — Other Ambulatory Visit: Payer: Self-pay | Admitting: Internal Medicine

## 2018-04-23 NOTE — Telephone Encounter (Signed)
Done erx 

## 2018-04-23 NOTE — Telephone Encounter (Signed)
03/16/2018 30# 

## 2018-04-25 ENCOUNTER — Other Ambulatory Visit: Payer: Self-pay | Admitting: Internal Medicine

## 2018-05-13 ENCOUNTER — Other Ambulatory Visit: Payer: Self-pay | Admitting: Internal Medicine

## 2018-05-14 ENCOUNTER — Other Ambulatory Visit: Payer: Self-pay | Admitting: Internal Medicine

## 2018-05-14 ENCOUNTER — Encounter: Payer: Self-pay | Admitting: Internal Medicine

## 2018-05-14 MED ORDER — LIRAGLUTIDE -WEIGHT MANAGEMENT 18 MG/3ML ~~LOC~~ SOPN: 3.0000 mg | PEN_INJECTOR | Freq: Every day | SUBCUTANEOUS | 1 refills | Status: DC

## 2018-05-14 MED ORDER — ALPRAZOLAM 0.25 MG PO TABS: 0.2500 mg | ORAL_TABLET | Freq: Every day | ORAL | 0 refills | Status: DC | PRN

## 2018-05-14 NOTE — Telephone Encounter (Signed)
Done erx 

## 2018-05-23 ENCOUNTER — Encounter: Payer: Self-pay | Admitting: Internal Medicine

## 2018-05-23 MED ORDER — INSULIN PEN NEEDLE 32G X 4 MM MISC: 5 refills | Status: DC

## 2018-05-31 ENCOUNTER — Encounter: Payer: BC Managed Care – PPO | Admitting: Internal Medicine

## 2018-07-16 ENCOUNTER — Other Ambulatory Visit: Payer: Self-pay | Admitting: Internal Medicine

## 2018-07-19 ENCOUNTER — Telehealth: Payer: Self-pay | Admitting: Internal Medicine

## 2018-07-19 NOTE — Telephone Encounter (Signed)
Due to office policy, I cannot authorize refill of any med due to last seen may 2018; please consider OV

## 2018-07-19 NOTE — Telephone Encounter (Signed)
Copied from [place]. Topic: Quick Communication - See Telephone Encounter >> Jul 19, 2018 10:31 AM Synthia Innocent wrote: CRM for notification. See Telephone encounter for: 07/19/18 Xanax is not helping her sleep she is requesting to go back on clonazePAM (KLONOPIN) 1 MG tablet and stop the xanax. Able to call in refill? [facility] [address]

## 2018-07-27 ENCOUNTER — Encounter: Payer: Self-pay | Admitting: Internal Medicine

## 2018-07-27 NOTE — Telephone Encounter (Signed)
Same answer as phone note sept 12 - no further rrefills of anything due to office policy as has not been seen since 2018

## 2018-07-30 ENCOUNTER — Encounter: Payer: Self-pay | Admitting: Internal Medicine

## 2018-07-30 ENCOUNTER — Other Ambulatory Visit (INDEPENDENT_AMBULATORY_CARE_PROVIDER_SITE_OTHER): Payer: BC Managed Care – PPO

## 2018-07-30 ENCOUNTER — Ambulatory Visit (INDEPENDENT_AMBULATORY_CARE_PROVIDER_SITE_OTHER): Payer: BC Managed Care – PPO | Admitting: Internal Medicine

## 2018-07-30 DIAGNOSIS — R4789 Other speech disturbances: Secondary | ICD-10-CM

## 2018-07-30 DIAGNOSIS — F4310 Post-traumatic stress disorder, unspecified: Secondary | ICD-10-CM

## 2018-07-30 DIAGNOSIS — Z23 Encounter for immunization: Secondary | ICD-10-CM | POA: Diagnosis not present

## 2018-07-30 DIAGNOSIS — Z0000 Encounter for general adult medical examination without abnormal findings: Secondary | ICD-10-CM | POA: Diagnosis not present

## 2018-07-30 DIAGNOSIS — E559 Vitamin D deficiency, unspecified: Secondary | ICD-10-CM

## 2018-07-30 LAB — CBC WITH DIFFERENTIAL/PLATELET
BASOS ABS: 0 10*3/uL (ref 0.0–0.1)
Basophils Relative: 0.3 % (ref 0.0–3.0)
Eosinophils Absolute: 0.1 10*3/uL (ref 0.0–0.7)
Eosinophils Relative: 1.2 % (ref 0.0–5.0)
HCT: 42.9 % (ref 36.0–46.0)
Hemoglobin: 14.7 g/dL (ref 12.0–15.0)
LYMPHS ABS: 2.6 10*3/uL (ref 0.7–4.0)
Lymphocytes Relative: 33.4 % (ref 12.0–46.0)
MCHC: 34.2 g/dL (ref 30.0–36.0)
MCV: 86.1 fl (ref 78.0–100.0)
MONO ABS: 0.4 10*3/uL (ref 0.1–1.0)
MONOS PCT: 5.6 % (ref 3.0–12.0)
NEUTROS ABS: 4.6 10*3/uL (ref 1.4–7.7)
NEUTROS PCT: 59.5 % (ref 43.0–77.0)
PLATELETS: 271 10*3/uL (ref 150.0–400.0)
RBC: 4.98 Mil/uL (ref 3.87–5.11)
RDW: 12.3 % (ref 11.5–15.5)
WBC: 7.8 10*3/uL (ref 4.0–10.5)

## 2018-07-30 LAB — BASIC METABOLIC PANEL
BUN: 8 mg/dL (ref 6–23)
CALCIUM: 9.3 mg/dL (ref 8.4–10.5)
CO2: 28 meq/L (ref 19–32)
CREATININE: 0.75 mg/dL (ref 0.40–1.20)
Chloride: 103 mEq/L (ref 96–112)
GFR: 87.82 mL/min (ref >60.00)
Glucose, Bld: 84 mg/dL (ref 70–99)
Potassium: 4.2 mEq/L (ref 3.5–5.1)
SODIUM: 138 meq/L (ref 135–145)

## 2018-07-30 LAB — URINALYSIS, ROUTINE W REFLEX MICROSCOPIC
BILIRUBIN URINE: NEGATIVE
HGB URINE DIPSTICK: NEGATIVE
KETONES UR: NEGATIVE
LEUKOCYTES UA: NEGATIVE
NITRITE: NEGATIVE
Specific Gravity, Urine: 1.01 (ref 1.000–1.030)
Total Protein, Urine: NEGATIVE
URINE GLUCOSE: NEGATIVE
UROBILINOGEN UA: 0.2 (ref 0.0–1.0)
pH: 6 (ref 5.0–8.0)

## 2018-07-30 LAB — VITAMIN B12: Vitamin B-12: 536 pg/mL (ref 211–911)

## 2018-07-30 LAB — HEPATIC FUNCTION PANEL
ALK PHOS: 89 U/L (ref 39–117)
ALT: 20 U/L (ref 0–35)
AST: 13 U/L (ref 0–37)
Albumin: 4 g/dL (ref 3.5–5.2)
BILIRUBIN DIRECT: 0 mg/dL (ref 0.0–0.3)
BILIRUBIN TOTAL: 0.3 mg/dL (ref 0.2–1.2)
TOTAL PROTEIN: 7.4 g/dL (ref 6.0–8.3)

## 2018-07-30 LAB — VITAMIN D 25 HYDROXY (VIT D DEFICIENCY, FRACTURES): VITD: 37.07 ng/mL (ref 30.00–100.00)

## 2018-07-30 LAB — LIPID PANEL
Cholesterol: 177 mg/dL (ref 0–200)
HDL: 37.3 mg/dL — AB (ref >39.00)
LDL CALC: 114 mg/dL — AB (ref 0–99)
NONHDL: 139.82
TRIGLYCERIDES: 130 mg/dL (ref 0.0–149.0)
Total CHOL/HDL Ratio: 5
VLDL: 26 mg/dL (ref 0.0–40.0)

## 2018-07-30 LAB — TSH: TSH: 2.02 u[IU]/mL (ref 0.35–4.50)

## 2018-07-30 MED ORDER — BUPROPION HCL ER (XL) 300 MG PO TB24: 300.0000 mg | ORAL_TABLET | Freq: Every day | ORAL | 3 refills | Status: DC

## 2018-07-30 MED ORDER — CLONAZEPAM 1 MG PO TABS: ORAL_TABLET | ORAL | 5 refills | Status: DC

## 2018-07-30 NOTE — Assessment & Plan Note (Signed)
With depression - for wellbutrin ER 300 qd, psychology referral for counseling or possible ADD

## 2018-07-30 NOTE — Assessment & Plan Note (Signed)
For f/u. Vit d

## 2018-07-30 NOTE — Patient Instructions (Addendum)
You had the flu shot today  Please take all new medication as prescribed - the wellbutrin, and the klonopin  You will be contacted regarding the referral for: ADD testing  Please continue all other medications as before, and refills have been done if requested.  Please have the pharmacy call with any other refills you may need.  Please continue your efforts at being more active, low cholesterol diet, and weight control.  You are otherwise up to date with prevention measures today.  Please keep your appointments with your specialists as you may have planned  Please go to the LAB in the Basement (turn left off the elevator) for the tests to be done today  You will be contacted by phone if any changes need to be made immediately.  Otherwise, you will receive a letter about your results with an explanation, but please check with MyChart first.  Please remember to sign up for MyChart if you have not done so, as this will be important to you in the future with finding out test results, communicating by private email, and scheduling acute appointments online when needed.  Please return in 1 year for your yearly visit, or sooner if needed

## 2018-07-30 NOTE — Progress Notes (Signed)
Subjective:    Patient ID: Laurie Solomon, female    DOB: 01-04-1971, 47 y.o.   MRN: [id number]  HPI  Here for wellness and f/u;  Overall doing ok;  Pt denies Chest pain, worsening SOB, DOE, wheezing, orthopnea, PND, worsening LE edema, palpitations, dizziness or syncope.  Pt denies neurological change such as new headache, facial or extremity weakness.  Pt denies polydipsia, polyuria, or low sugar symptoms. Pt states overall good compliance with treatment and medications, good tolerability, and has been trying to follow appropriate diet.  Pt denies worsening depressive symptoms, suicidal ideation or panic. No fever, night sweats, wt loss, loss of appetite, or other constitutional symptoms.  Pt states good ability with ADL's, has low fall risk, home safety reviewed and adequate, no other significant changes in hearing or vision, and only occasionally active with exercise. Tapered off the zoloft due to fatigue and sleepiness and not really working.  Did also try saxenda for wt loss, despite walking 2 miles daily since July has not lost major weight, but has lost some.   Wt Readings from Last 3 Encounters:  07/30/18 211 lb (95.7 kg)  03/09/17 229 lb (103.9 kg)  10/13/16 224 lb (101.6 kg)  Also has persistent maybe worsening lack of concentration, thinking possible ADD.  Also with persistent sleep onset difficulty and subjective RLS.   Past Medical History:  Diagnosis Date  . Allergic rhinitis   . Asthma    childhood  . Chronic insomnia   . Headache(784.0)   . Hypothyroidism 07/09/2015  . Post traumatic stress disorder   . Vitamin D deficiency 07/09/2015   Past Surgical History:  Procedure Laterality Date  . REFRACTIVE SURGERY      reports that she has never smoked. She has never used smokeless tobacco. She reports that she drinks about 2.0 standard drinks of alcohol per week. She reports that she does not use drugs. family history includes Breast cancer in her other and other; Diabetes in her  paternal grandfather; Heart disease in her maternal grandfather; Hypertension in her maternal grandfather. No Known Allergies Current Outpatient Medications on File Prior to Visit  Medication Sig Dispense Refill  . Insulin Pen Needle 32G X 4 MM MISC Use to administer the Saxenda once a day 30 each 5  . levothyroxine (SYNTHROID, LEVOTHROID) 25 MCG tablet     . Liraglutide -Weight Management (SAXENDA) 18 MG/3ML SOPN Inject 3 mg into the skin daily. 15 mL 1  . montelukast (SINGULAIR) 10 MG tablet TAKE 1 TABLET (10 MG TOTAL) BY MOUTH AT BEDTIME. **PT NEEDS APPOINTMENT FOR ADDITIONAL REFILLS** 90 tablet 0  . Multiple Vitamin (MULTIVITAMIN) tablet Take 1 tablet by mouth daily.      . NONFORMULARY OR COMPOUNDED ITEM Allergy Vaccine 1:10 Given at [facility]    . Saw Palmetto, Serenoa repens, (SAW PALMETTO PO) Take 200 mg by mouth daily.    . typhoid (VIVOTIF) DR capsule Take 1 capsule by mouth every other day. 4 capsule 0  . albuterol (VENTOLIN HFA) 108 (90 Base) MCG/ACT inhaler Inhale 1-2 puffs into the lungs every 6 (six) hours as needed. (Patient not taking: Reported on 07/30/2018) 1 Inhaler 5   No current facility-administered medications on file prior to visit.    Review of Systems Constitutional: Negative for other unusual diaphoresis, sweats, appetite or weight changes HENT: Negative for other worsening hearing loss, ear pain, facial swelling, mouth sores or neck stiffness.   Eyes: Negative for other worsening pain, redness or other visual disturbance.  Respiratory: Negative for other stridor or swelling Cardiovascular: Negative for other palpitations or other chest pain  Gastrointestinal: Negative for worsening diarrhea or loose stools, blood in stool, distention or other pain Genitourinary: Negative for hematuria, flank pain or other change in urine volume.  Musculoskeletal: Negative for myalgias or other joint swelling.  Skin: Negative for other color change, or other wound or  worsening drainage.  Neurological: Negative for other syncope or numbness. Hematological: Negative for other adenopathy or swelling Psychiatric/Behavioral: Negative for hallucinations, other worsening agitation, SI, self-injury, or new decreased concentration All other symptoms neg per pt    Objective:   Physical Exam BP 116/80 (BP Location: Left Arm, Patient Position: Sitting, Cuff Size: Normal)   Pulse 88   Temp 98.3 F (36.8 C) (Oral)   Ht 5\' 8"  (1.727 m)   Wt 211 lb (95.7 kg)   SpO2 97%   BMI 32.08 kg/m  VS noted,  Constitutional: Pt is oriented to person, place, and time. Appears well-developed and well-nourished, in no significant distress and comfortable Head: Normocephalic and atraumatic  Eyes: Conjunctivae and EOM are normal. Pupils are equal, round, and reactive to light Right Ear: External ear normal without discharge Left Ear: External ear normal without discharge Nose: Nose without discharge or deformity Mouth/Throat: Oropharynx is without other ulcerations and moist  Neck: Normal range of motion. Neck supple. No JVD present. No tracheal deviation present or significant neck LA or mass Cardiovascular: Normal rate, regular rhythm, normal heart sounds and intact distal pulses.   Pulmonary/Chest: WOB normal and breath sounds without rales or wheezing  Abdominal: Soft. Bowel sounds are normal. NT. No HSM  Musculoskeletal: Normal range of motion. Exhibits no edema Lymphadenopathy: Has no other cervical adenopathy.  Neurological: Pt is alert and oriented to person, place, and time. Pt has normal reflexes. No cranial nerve deficit. Motor grossly intact, Gait intact Skin: Skin is warm and dry. No rash noted or new ulcerations Psychiatric:  Has anxious depressed mood and affect. Behavior is normal without agitation No other exam findings Lab Results  Component Value Date   WBC 7.8 07/30/2018   HGB 14.7 07/30/2018   HCT 42.9 07/30/2018   PLT 271.0 07/30/2018   GLUCOSE 84  07/30/2018   CHOL 177 07/30/2018   TRIG 130.0 07/30/2018   HDL 37.30 (L) 07/30/2018   LDLCALC 114 (H) 07/30/2018   ALT 20 07/30/2018   AST 13 07/30/2018   NA 138 07/30/2018   K 4.2 07/30/2018   CL 103 07/30/2018   CREATININE 0.75 07/30/2018   BUN 8 07/30/2018   CO2 28 07/30/2018   TSH 2.02 07/30/2018         Assessment & Plan:

## 2018-07-30 NOTE — Assessment & Plan Note (Signed)
Ok to continue saxenda

## 2018-07-30 NOTE — Assessment & Plan Note (Signed)

## 2018-07-31 ENCOUNTER — Telehealth: Payer: Self-pay | Admitting: Internal Medicine

## 2018-07-31 NOTE — Telephone Encounter (Signed)
-----   Message from Merril Abbe, [place] sent at 07/31/2018 11:48 AM EDT ----- FYI:  Your patient is scheduled to see Dr. Irven Shelling on 10/02/18. Pt is aware.  Thank you for the referral.

## 2018-08-13 ENCOUNTER — Other Ambulatory Visit: Payer: Self-pay | Admitting: Internal Medicine

## 2018-08-21 ENCOUNTER — Ambulatory Visit (INDEPENDENT_AMBULATORY_CARE_PROVIDER_SITE_OTHER): Payer: BC Managed Care – PPO | Admitting: Psychology

## 2018-08-21 DIAGNOSIS — F909 Attention-deficit hyperactivity disorder, unspecified type: Secondary | ICD-10-CM | POA: Diagnosis not present

## 2018-09-07 ENCOUNTER — Ambulatory Visit: Payer: BC Managed Care – PPO

## 2018-09-17 ENCOUNTER — Ambulatory Visit (INDEPENDENT_AMBULATORY_CARE_PROVIDER_SITE_OTHER): Payer: BC Managed Care – PPO | Admitting: Psychology

## 2018-09-17 DIAGNOSIS — F4010 Social phobia, unspecified: Secondary | ICD-10-CM

## 2018-09-17 DIAGNOSIS — F3342 Major depressive disorder, recurrent, in full remission: Secondary | ICD-10-CM | POA: Diagnosis not present

## 2018-10-02 ENCOUNTER — Ambulatory Visit: Payer: BC Managed Care – PPO | Admitting: Psychology

## 2018-10-27 ENCOUNTER — Encounter: Payer: Self-pay | Admitting: Internal Medicine

## 2018-10-29 MED ORDER — LIRAGLUTIDE -WEIGHT MANAGEMENT 18 MG/3ML ~~LOC~~ SOPN: 3.0000 mg | PEN_INJECTOR | Freq: Every day | SUBCUTANEOUS | 3 refills | Status: DC

## 2018-12-02 ENCOUNTER — Encounter: Payer: Self-pay | Admitting: Internal Medicine

## 2018-12-03 ENCOUNTER — Encounter: Payer: Self-pay | Admitting: Internal Medicine

## 2018-12-03 ENCOUNTER — Telehealth: Payer: Self-pay

## 2018-12-03 ENCOUNTER — Ambulatory Visit: Payer: BC Managed Care – PPO | Admitting: Internal Medicine

## 2018-12-03 ENCOUNTER — Other Ambulatory Visit (INDEPENDENT_AMBULATORY_CARE_PROVIDER_SITE_OTHER): Payer: BC Managed Care – PPO

## 2018-12-03 ENCOUNTER — Other Ambulatory Visit: Payer: Self-pay | Admitting: Internal Medicine

## 2018-12-03 VITALS — BP 126/84 | HR 85 | Temp 98.2°F | Ht 68.0 in | Wt 193.0 lb

## 2018-12-03 DIAGNOSIS — R3129 Other microscopic hematuria: Secondary | ICD-10-CM

## 2018-12-03 DIAGNOSIS — R1013 Epigastric pain: Secondary | ICD-10-CM | POA: Insufficient documentation

## 2018-12-03 DIAGNOSIS — R945 Abnormal results of liver function studies: Secondary | ICD-10-CM

## 2018-12-03 DIAGNOSIS — R748 Abnormal levels of other serum enzymes: Secondary | ICD-10-CM

## 2018-12-03 DIAGNOSIS — R1030 Lower abdominal pain, unspecified: Secondary | ICD-10-CM

## 2018-12-03 DIAGNOSIS — R7989 Other specified abnormal findings of blood chemistry: Secondary | ICD-10-CM

## 2018-12-03 LAB — URINALYSIS, ROUTINE W REFLEX MICROSCOPIC
Bilirubin Urine: NEGATIVE
KETONES UR: NEGATIVE
NITRITE: NEGATIVE
Total Protein, Urine: NEGATIVE
Urine Glucose: NEGATIVE
Urobilinogen, UA: 0.2 (ref 0.0–1.0)
pH: 6 (ref 5.0–8.0)

## 2018-12-03 LAB — BASIC METABOLIC PANEL
BUN: 7 mg/dL (ref 6–23)
CO2: 27 mEq/L (ref 19–32)
Calcium: 9.4 mg/dL (ref 8.4–10.5)
Chloride: 103 mEq/L (ref 96–112)
Creatinine, Ser: 0.98 mg/dL (ref 0.40–1.20)
GFR: 60.59 mL/min (ref >60.00)
Glucose, Bld: 82 mg/dL (ref 70–99)
Potassium: 4 mEq/L (ref 3.5–5.1)
Sodium: 138 mEq/L (ref 135–145)

## 2018-12-03 LAB — CBC WITH DIFFERENTIAL/PLATELET
BASOS PCT: 0.5 % (ref 0.0–3.0)
Basophils Absolute: 0 10*3/uL (ref 0.0–0.1)
EOS ABS: 0.2 10*3/uL (ref 0.0–0.7)
Eosinophils Relative: 2.7 % (ref 0.0–5.0)
HCT: 43.8 % (ref 36.0–46.0)
Hemoglobin: 14.8 g/dL (ref 12.0–15.0)
Lymphocytes Relative: 29.5 % (ref 12.0–46.0)
Lymphs Abs: 2.6 10*3/uL (ref 0.7–4.0)
MCHC: 33.9 g/dL (ref 30.0–36.0)
MCV: 88.6 fl (ref 78.0–100.0)
Monocytes Absolute: 0.5 10*3/uL (ref 0.1–1.0)
Monocytes Relative: 6.1 % (ref 3.0–12.0)
Neutro Abs: 5.5 10*3/uL (ref 1.4–7.7)
Neutrophils Relative %: 61.2 % (ref 43.0–77.0)
Platelets: 272 10*3/uL (ref 150.0–400.0)
RBC: 4.94 Mil/uL (ref 3.87–5.11)
RDW: 12.6 % (ref 11.5–15.5)
WBC: 8.9 10*3/uL (ref 4.0–10.5)

## 2018-12-03 LAB — HEPATIC FUNCTION PANEL
ALT: 321 U/L — ABNORMAL HIGH (ref 0–35)
AST: 88 U/L — ABNORMAL HIGH (ref 0–37)
Albumin: 4.2 g/dL (ref 3.5–5.2)
Alkaline Phosphatase: 125 U/L — ABNORMAL HIGH (ref 39–117)
Bilirubin, Direct: 0.1 mg/dL (ref 0.0–0.3)
Total Bilirubin: 0.4 mg/dL (ref 0.2–1.2)
Total Protein: 7.2 g/dL (ref 6.0–8.3)

## 2018-12-03 LAB — LIPASE: Lipase: 68 U/L — ABNORMAL HIGH (ref 11.0–59.0)

## 2018-12-03 MED ORDER — HYDROCODONE-ACETAMINOPHEN 5-325 MG PO TABS: 1.0000 | ORAL_TABLET | Freq: Four times a day (QID) | ORAL | 0 refills | Status: DC | PRN

## 2018-12-03 NOTE — Assessment & Plan Note (Addendum)
Exam benign, but despite her marked anxiety today, it seems she did have a quite significant episode of pain of unclear etiology for about 90 minutes a few days ago without recurrence; exam is benign and she feels nearly well today; is afeb, vss.  Differential includes gallbladder colic and renal colic; for labs today, and consider abd u/s vs CT abd pending results  Note:  Total time for pt hx, exam, review of record with pt in the room, determination of diagnoses and plan for further eval and tx is > 40 min, with over 50% spent in coordination and counseling of patient including the differential dx, tx, further evaluation and other management of epigastric pain with other symptoms to include need to r/o GB vs renal stone vs other

## 2018-12-03 NOTE — Telephone Encounter (Signed)
Called pt for clarification and was provided a temephone number of [phone] where she spoke to [place]. I did not receive an answer but I left a detailed msg in hopes o getting records prior to pt's appt this afternoon.   Copied from [place] [phone]. Topic: General - Other >> Dec 03, 2018 10:50 AM Ivar Drape wrote: Reason for CRM:  Patient has a f/u appt with Dr. Jenny Reichmann today at 2:40pm after a 911 call.  She would like for the practice to get records from the EMS that saw her before her appt.  Please fax the request to: Fax: [phone] and the Incident #: [id number] attn: Melina Modena >> Dec 03, 2018 11:09 AM Para Skeans A wrote: Can you see this in epic? I am unsure how that works?

## 2018-12-03 NOTE — Telephone Encounter (Signed)
shirron to assist pt with appt next available

## 2018-12-03 NOTE — Patient Instructions (Addendum)
Please take all new medication as prescribed - the pain medication only if the pain returns  Please continue all other medications as before, although I would hold on the saxenda until testing is done  Please have the pharmacy call with any other refills you may need.  Please keep your appointments with your specialists as you may have planned  Please go to the LAB in the Basement (turn left off the elevator) for the tests to be done today  You will be contacted by phone if any changes need to be made immediately.  Otherwise, you will receive a letter about your results with an explanation, but please check with MyChart first  Please remember to sign up for MyChart if you have not done so, as this will be important to you in the future with finding out test results, communicating by private email, and scheduling acute appointments online when needed.  We will most likely need either an ultrasound to check the gallbladder, or a CT scan to check for kidney stone, depending on your lab testing

## 2018-12-03 NOTE — Progress Notes (Signed)
Subjective:    Patient ID: Laurie Solomon, female    DOB: [birth year], 48 y.o.   MRN: [id number]  HPI  Here with episode Saturday 11 am (lasted 90 minutes overall) of abd/chest and back (kind of in the middle) pain, clammy, HR elevated,, nausea, position change did not help so just laid on the floor; pain overall worse than childbirth, gradually better after the 90 mintues; No blood with vomiting but did see darker urine after that she though might be related mild low volume at the time; No constipation and no diarrhea. Vomited 3 times after the 90 mintues and all symptoms resolved.   No fever except did feel hot and clammy.  Seen per EMS, VSS, pain improved, so did not go to ER.  Nothing since then except some soreness to the lower back, and in fact feels like maybe going to the gym.  Pt denies chest pain, increased sob or doe, wheezing, orthopnea, PND, increased LE swelling, palpitations, dizziness or syncope.  Pt denies new neurological symptoms such as new headache, or facial or extremity weakness or numbness   Pt denies polydipsia, polyuria Past Medical History:  Diagnosis Date  . Allergic rhinitis   . Asthma    childhood  . Chronic insomnia   . Headache(784.0)   . Hypothyroidism 07/09/2015  . Post traumatic stress disorder   . Vitamin D deficiency 07/09/2015   Past Surgical History:  Procedure Laterality Date  . REFRACTIVE SURGERY      reports that she has never smoked. She has never used smokeless tobacco. She reports current alcohol use of about 2.0 standard drinks of alcohol per week. She reports that she does not use drugs. family history includes Breast cancer in some other family members; Diabetes in her paternal grandfather; Heart disease in her maternal grandfather; Hypertension in her maternal grandfather. No Known Allergies Current Outpatient Medications on File Prior to Visit  Medication Sig Dispense Refill  . albuterol (VENTOLIN HFA) 108 (90 Base) MCG/ACT inhaler Inhale 1-2  puffs into the lungs every 6 (six) hours as needed. 1 Inhaler 5  . buPROPion (WELLBUTRIN XL) 300 MG 24 hr tablet Take 1 tablet (300 mg total) by mouth daily. 90 tablet 3  . clonazePAM (KLONOPIN) 1 MG tablet 1/2 tab by mouth in the AM and 1 tab at bedtime 45 tablet 5  . Insulin Pen Needle 32G X 4 MM MISC Use to administer the Saxenda once a day 30 each 5  . levothyroxine (SYNTHROID, LEVOTHROID) 25 MCG tablet     . Liraglutide -Weight Management (SAXENDA) 18 MG/3ML SOPN Inject 3 mg into the skin daily. 15 mL 3  . montelukast (SINGULAIR) 10 MG tablet Take 1 tablet (10 mg total) by mouth at bedtime. 90 tablet 3  . Multiple Vitamin (MULTIVITAMIN) tablet Take 1 tablet by mouth daily.      . NONFORMULARY OR COMPOUNDED ITEM Allergy Vaccine 1:10 Given at [facility]    . Saw Palmetto, Serenoa repens, (SAW PALMETTO PO) Take 200 mg by mouth daily.    . typhoid (VIVOTIF) DR capsule Take 1 capsule by mouth every other day. 4 capsule 0   No current facility-administered medications on file prior to visit.    Review of Systems  Constitutional: Negative for other unusual diaphoresis or sweats HENT: Negative for ear discharge or swelling Eyes: Negative for other worsening visual disturbances Respiratory: Negative for stridor or other swelling  Gastrointestinal: Negative for worsening distension or other blood Genitourinary: Negative for retention or other  urinary change Musculoskeletal: Negative for other MSK pain or swelling Skin: Negative for color change or other new lesions Neurological: Negative for worsening tremors and other numbness  Psychiatric/Behavioral: Negative for worsening agitation or other fatigue All other system neg per pt    Objective:   Physical Exam BP 126/84   Pulse 85   Temp 98.2 F (36.8 C) (Oral)   Ht 5\' 8"  (1.727 m)   Wt 193 lb (87.5 kg)   SpO2 97%   BMI 29.35 kg/m  VS noted,  Constitutional: Pt appears in NAD HENT: Head: NCAT.  Right Ear: External ear  normal.  Left Ear: External ear normal.  Eyes: . Pupils are equal, round, and reactive to light. Conjunctivae and EOM are normal Nose: without d/c or deformity Neck: Neck supple. Gross normal ROM Cardiovascular: Normal rate and regular rhythm.   Pulmonary/Chest: Effort normal and breath sounds without rales or wheezing.  Abd:  Soft, NT, ND, + BS, no organomegaly except for very mild RLQ tender, no flank tender Spine nontender, no swelling, erythema, rash Neurological: Pt is alert. At baseline orientation, motor grossly intact Skin: Skin is warm. No rashes, other new lesions, no LE edema Psychiatric: Pt behavior is normal without agitation , 1-2+ nervous No other exam findings      Assessment & Plan:

## 2018-12-04 ENCOUNTER — Telehealth: Payer: Self-pay | Admitting: Internal Medicine

## 2018-12-04 ENCOUNTER — Ambulatory Visit
Admission: RE | Admit: 2018-12-04 | Discharge: 2018-12-04 | Disposition: A | Discharge status: Home / Self Care | Payer: BC Managed Care – PPO | Source: Ambulatory Visit | Attending: Internal Medicine | Admitting: Internal Medicine

## 2018-12-04 ENCOUNTER — Telehealth: Payer: Self-pay

## 2018-12-04 ENCOUNTER — Other Ambulatory Visit: Payer: Self-pay | Admitting: Internal Medicine

## 2018-12-04 DIAGNOSIS — R7989 Other specified abnormal findings of blood chemistry: Secondary | ICD-10-CM

## 2018-12-04 DIAGNOSIS — R945 Abnormal results of liver function studies: Secondary | ICD-10-CM

## 2018-12-04 DIAGNOSIS — R3129 Other microscopic hematuria: Secondary | ICD-10-CM

## 2018-12-04 DIAGNOSIS — R748 Abnormal levels of other serum enzymes: Secondary | ICD-10-CM

## 2018-12-04 DIAGNOSIS — R1013 Epigastric pain: Secondary | ICD-10-CM

## 2018-12-04 DIAGNOSIS — R319 Hematuria, unspecified: Secondary | ICD-10-CM

## 2018-12-04 NOTE — Telephone Encounter (Signed)
Spoke with Jonelle Sidle at office about results.

## 2018-12-04 NOTE — Telephone Encounter (Signed)
Pt has viewed results via MyChart  

## 2018-12-04 NOTE — Telephone Encounter (Signed)
-----   Message from Biagio Borg, MD sent at 12/04/2018 11:05 AM EST ----- Left message on MyChart, pt to cont same tx except  The test results show that your current treatment is OK, as the ultrasound showed gallstones, but no other swelling or obstructions.  Because of the pain and the blood in the urine, we should proceed with the CT Abd/pelvis to look for renal stone.  If the CT is negative, I would say the pain most likely was due to gallbladder stones with an episode of transient stone impaction (stone stuck in the gallbladder neck).  If the CT is negative, we should consider referral to General Surgury to consider GB removal  Latrelle Fuston to please inform pt, I will do CT order

## 2018-12-04 NOTE — Telephone Encounter (Signed)
Laurie Solomon from [facility] called to say Abdominal US report is available for view.

## 2018-12-04 NOTE — Telephone Encounter (Signed)
FYI

## 2018-12-04 NOTE — Telephone Encounter (Signed)
-----   Message from Biagio Borg, MD sent at 12/03/2018  4:35 PM EST ----- Left message on MyChart, pt to cont same tx except  The test results show that your current treatment is OK, except the testing shows increased liver tests AND a small blood in the urine (now that is confusing).  So to start, we will check the ultrasound to make sure of the gallbladder situation, as well as assess for any swelling around the right kidney.  If the ultrasound is negative, we can consider checking a CT scan to make sure of any kidney stone trying to pass down the ureter..  I will order the ultrasound, and you should hear from the office soon.  Also of note is that the white blood cells were normal, which argues against infection, and the Lipase is slightly elevated, which can be nonspecific but also theoretically could mean some mild pancreatitis due to passage of a gallstone.    Shirron to please inform pt, I will do stat u/s order to check GB and any right kidney swelling; if negative we will most likely order a CT scan to rule out renal stone

## 2018-12-05 ENCOUNTER — Other Ambulatory Visit: Payer: Self-pay | Admitting: Internal Medicine

## 2018-12-05 ENCOUNTER — Encounter: Payer: Self-pay | Admitting: Internal Medicine

## 2018-12-05 ENCOUNTER — Ambulatory Visit
Admission: RE | Admit: 2018-12-05 | Discharge: 2018-12-05 | Disposition: A | Discharge status: Home / Self Care | Payer: BC Managed Care – PPO | Source: Ambulatory Visit | Attending: Internal Medicine | Admitting: Internal Medicine

## 2018-12-05 ENCOUNTER — Telehealth: Payer: Self-pay

## 2018-12-05 ENCOUNTER — Other Ambulatory Visit: Payer: Self-pay

## 2018-12-05 DIAGNOSIS — R319 Hematuria, unspecified: Secondary | ICD-10-CM

## 2018-12-05 DIAGNOSIS — R1013 Epigastric pain: Secondary | ICD-10-CM

## 2018-12-05 DIAGNOSIS — R3129 Other microscopic hematuria: Secondary | ICD-10-CM

## 2018-12-05 DIAGNOSIS — K8020 Calculus of gallbladder without cholecystitis without obstruction: Secondary | ICD-10-CM

## 2018-12-05 NOTE — Telephone Encounter (Signed)
-----   Message from Biagio Borg, MD sent at 12/05/2018 12:45 PM EST ----- Left message on MyChart, pt to cont same tx except  The test results show that your current treatment is OK, as the CT scan is negative for any new problem.  I think we can hold on further testing for now, and there appears to be no new treatment needed, but the last thing I was thinking was to refer you to Urology for the blood in the urine to see if there was a cause in the bladder, which is the only thing the CT scan cant see well enough.  I will go ahead and do the referral, and hopefully you should hear soon. Redmond Baseman to please inform pt, I will do referral

## 2018-12-05 NOTE — Telephone Encounter (Signed)
Pt has viewed results via MyChart  

## 2018-12-19 ENCOUNTER — Encounter: Payer: Self-pay | Admitting: Internal Medicine

## 2018-12-31 ENCOUNTER — Telehealth: Payer: BC Managed Care – PPO | Admitting: Family

## 2018-12-31 DIAGNOSIS — R05 Cough: Secondary | ICD-10-CM | POA: Diagnosis not present

## 2018-12-31 DIAGNOSIS — R059 Cough, unspecified: Secondary | ICD-10-CM

## 2018-12-31 DIAGNOSIS — R6889 Other general symptoms and signs: Secondary | ICD-10-CM | POA: Diagnosis not present

## 2018-12-31 MED ORDER — BENZONATATE 200 MG PO CAPS: 200.0000 mg | ORAL_CAPSULE | Freq: Two times a day (BID) | ORAL | 0 refills | Status: DC | PRN

## 2018-12-31 NOTE — Progress Notes (Signed)
E visit for Flu like symptoms   We are sorry that you are not feeling well.  Here is how we plan to help! Based on what you have shared with me it looks like you may have a respiratory virus that may be influenza.  Influenza or "the flu" is   an infection caused by a respiratory virus. The flu virus is highly contagious and persons who did not receive their yearly flu vaccination may "catch" the flu from close contact.  We have anti-viral medications to treat the viruses that cause this infection. They are not a "cure" and only shorten the course of the infection. These prescriptions are most effective when they are given within the first 2 days of "flu" symptoms. Antiviral medication are indicated if you have a high risk of complications from the flu. You should  also consider an antiviral medication if you are in close contact with someone who is at risk. These medications can help patients avoid complications from the flu  but have side effects that you should know. Possible side effects from Tamiflu or oseltamivir include nausea, vomiting, diarrhea, dizziness, headaches, eye redness, sleep problems or other respiratory symptoms. You should not take Tamiflu if you have an allergy to oseltamivir or any to the ingredients in Tamiflu.  Based upon your symptoms and potential risk factors I recommend that you follow the flu symptoms recommendation that I have listed below. I have sent in tessalon 200 mg capsule you can take three times a day as needed for cough.   Approximately, 5 minutes spent documenting and reviewing patients chart.   ANYONE WHO HAS FLU SYMPTOMS SHOULD: . Stay home. The flu is highly contagious and going out or to work exposes others! . Be sure to drink plenty of fluids. Water is fine as well as fruit juices, sodas and electrolyte beverages. You may want to stay away from caffeine or alcohol. If you are nauseated, try taking small sips of liquids. How do you know if you are getting  enough fluid? Your urine should be a pale yellow or almost colorless. . Get rest. . Taking a steamy shower or using a humidifier may help nasal congestion and ease sore throat pain. Using a saline nasal spray works much the same way. . Cough drops, hard candies and sore throat lozenges may ease your cough. . Line up a caregiver. Have someone check on you regularly.   GET HELP RIGHT AWAY IF: . You cannot keep down liquids or your medications. . You become short of breath . Your fell like you are going to pass out or loose consciousness. . Your symptoms persist after you have completed your treatment plan MAKE SURE YOU   Understand these instructions.  Will watch your condition.  Will get help right away if you are not doing well or get worse.  Your e-visit answers were reviewed by a board certified advanced clinical practitioner to complete your personal care plan.  Depending on the condition, your plan could have included both over the counter or prescription medications.  If there is a problem please reply  once you have received a response from your provider.  Your safety is important to [place].  If you have drug allergies check your prescription carefully.    You can use MyChart to ask questions about today's visit, request a non-urgent call back, or ask for a work or school excuse for 24 hours related to this e-Visit. If it has been greater than 24 hours  you will need to follow up with your provider, or enter a new e-Visit to address those concerns.  You will get an e-mail in the next two days asking about your experience.  I hope that your e-visit has been valuable and will speed your recovery. Thank you for using e-visits.

## 2019-03-03 ENCOUNTER — Other Ambulatory Visit: Payer: Self-pay | Admitting: Internal Medicine

## 2019-03-12 ENCOUNTER — Other Ambulatory Visit: Payer: Self-pay | Admitting: Internal Medicine

## 2019-03-12 NOTE — Telephone Encounter (Signed)
Done erx 

## 2019-04-11 ENCOUNTER — Encounter: Payer: Self-pay | Admitting: Internal Medicine

## 2019-04-11 MED ORDER — LEVOTHYROXINE SODIUM 25 MCG PO TABS: 25.0000 ug | ORAL_TABLET | Freq: Every day | ORAL | 3 refills | Status: DC

## 2019-07-17 ENCOUNTER — Telehealth: Payer: BC Managed Care – PPO | Admitting: Family

## 2019-07-17 ENCOUNTER — Other Ambulatory Visit: Payer: Self-pay

## 2019-07-17 DIAGNOSIS — J019 Acute sinusitis, unspecified: Secondary | ICD-10-CM

## 2019-07-17 DIAGNOSIS — Z20822 Contact with and (suspected) exposure to covid-19: Secondary | ICD-10-CM

## 2019-07-17 MED ORDER — AMOXICILLIN-POT CLAVULANATE 875-125 MG PO TABS: 1.0000 | ORAL_TABLET | Freq: Two times a day (BID) | ORAL | 0 refills | Status: DC

## 2019-07-17 NOTE — Progress Notes (Signed)
We are sorry that you are not feeling well.  Here is how we plan to help!  Based on what you have shared with me it looks like you have sinusitis.  Sinusitis is inflammation and infection in the sinus cavities of the head.  Based on your presentation I believe you most likely have Acute Bacterial Sinusitis.  This is an infection caused by bacteria and is treated with antibiotics. I have prescribed Augmentin 875mg /125mg  one tablet twice daily with food, for 7 days. You may use an oral decongestant such as Mucinex D or if you have glaucoma or high blood pressure use plain Mucinex. Saline nasal spray help and can safely be used as often as needed for congestion.  If you develop worsening sinus pain, fever or notice severe headache and vision changes, or if symptoms are not better after completion of antibiotic, please schedule an appointment with a health care provider.   Approximately 5 minutes was spent documenting and reviewing patient's chart.     I do recommend that you get Covid tested given your symptoms. You can go to one of the  testing sites listed below, while they are opened (see hours). You do not need an order and will stay in your car during the test. You do need to self isolate until your results return and if positive 14 days from when your symptoms started and until you are 3 days symptom free.   Testing Locations (Monday - Friday, 8 a.m. - 3:30 p.m.) . [place]: [facility] at [facility], [address] [place], [place]: [place], [place], [place], [place] (entrance off [address])  . [facility]: (Closed each Monday): Testing site relocated to the short stay covered drive at [facility]. (Use the [address] entrance to [facility] next to [facility].)  Sinus infections are not as easily transmitted as other respiratory infection, however we still recommend that you avoid close  contact with loved ones, especially the very young and elderly.  Remember to wash your hands thoroughly throughout the day as this is the number one way to prevent the spread of infection!  Home Care:  Only take medications as instructed by your medical team.  Complete the entire course of an antibiotic.  Do not take these medications with alcohol.  A steam or ultrasonic humidifier can help congestion.  You can place a towel over your head and breathe in the steam from hot water coming from a faucet.  Avoid close contacts especially the very young and the elderly.  Cover your mouth when you cough or sneeze.  Always remember to wash your hands.  Get Help Right Away If:  You develop worsening fever or sinus pain.  You develop a severe head ache or visual changes.  Your symptoms persist after you have completed your treatment plan.  Make sure you  Understand these instructions.  Will watch your condition.  Will get help right away if you are not doing well or get worse.  Your e-visit answers were reviewed by a board certified advanced clinical practitioner to complete your personal care plan.  Depending on the condition, your plan could have included both over the counter or prescription medications.  If there is a problem please reply  once you have received a response from your provider.  Your safety is important to [place].  If you have drug allergies check your prescription carefully.  You can use MyChart to ask questions about today's visit, request a non-urgent call back, or ask for a work or school excuse for 24 hours related to this e-Visit. If it has been greater than 24 hours you will need to follow up with your provider, or enter a new e-Visit to address those concerns.  You will get an e-mail in the next two days asking about your experience.  I hope that your e-visit has been valuable and will speed your recovery. Thank you for using e-visits.

## 2019-07-19 ENCOUNTER — Encounter: Payer: Self-pay | Admitting: Internal Medicine

## 2019-07-19 DIAGNOSIS — Z20822 Contact with and (suspected) exposure to covid-19: Secondary | ICD-10-CM

## 2019-07-19 DIAGNOSIS — Z20828 Contact with and (suspected) exposure to other viral communicable diseases: Secondary | ICD-10-CM

## 2019-07-19 LAB — NOVEL CORONAVIRUS, NAA: SARS-CoV-2, NAA: NOT DETECTED

## 2019-07-20 ENCOUNTER — Other Ambulatory Visit: Payer: Self-pay | Admitting: Internal Medicine

## 2019-07-23 ENCOUNTER — Other Ambulatory Visit: Payer: BC Managed Care – PPO

## 2019-07-23 ENCOUNTER — Telehealth: Payer: Self-pay

## 2019-07-23 DIAGNOSIS — Z20822 Contact with and (suspected) exposure to covid-19: Secondary | ICD-10-CM

## 2019-07-23 DIAGNOSIS — Z20828 Contact with and (suspected) exposure to other viral communicable diseases: Secondary | ICD-10-CM

## 2019-07-23 NOTE — Telephone Encounter (Signed)
covid antibody test needed to be futured

## 2019-07-24 LAB — SAR COV2 SEROLOGY (COVID19)AB(IGG),IA: SARS CoV2 AB IGG: NEGATIVE

## 2019-08-15 ENCOUNTER — Other Ambulatory Visit: Payer: Self-pay | Admitting: Internal Medicine

## 2019-09-04 ENCOUNTER — Other Ambulatory Visit: Payer: Self-pay | Admitting: Internal Medicine

## 2019-10-18 ENCOUNTER — Other Ambulatory Visit: Payer: Self-pay | Admitting: Internal Medicine

## 2019-10-21 NOTE — Telephone Encounter (Signed)
Done erx 

## 2019-12-01 ENCOUNTER — Other Ambulatory Visit: Payer: Self-pay | Admitting: Internal Medicine

## 2019-12-02 NOTE — Telephone Encounter (Signed)
Called pt no answer LMOM w/MD response../lmb 

## 2019-12-02 NOTE — Telephone Encounter (Signed)
Klonopin refill x 1 mo only  Please let pt know - please to make rov for further refills

## 2019-12-30 ENCOUNTER — Ambulatory Visit: Payer: BC Managed Care – PPO

## 2020-01-01 ENCOUNTER — Other Ambulatory Visit: Payer: Self-pay | Admitting: Internal Medicine

## 2020-01-01 NOTE — Telephone Encounter (Signed)
Done erx 

## 2020-01-23 ENCOUNTER — Ambulatory Visit: Payer: BC Managed Care – PPO | Attending: Family

## 2020-01-23 DIAGNOSIS — Z23 Encounter for immunization: Secondary | ICD-10-CM

## 2020-01-23 NOTE — Progress Notes (Signed)
   Covid-19 Vaccination Clinic  Name:  Laurie Solomon    MRN: [id number] DOB: 28-Jun-1971  01/23/2020  Laurie Solomon was observed post Covid-19 immunization for 15 minutes without incident. She was provided with Vaccine Information Sheet and instruction to access the V-Safe system.   Laurie Solomon was instructed to call 911 with any severe reactions post vaccine: Marland Kitchen Difficulty breathing  . Swelling of face and throat  . A fast heartbeat  . A bad rash all over body  . Dizziness and weakness   Immunizations Administered    Name Date Dose VIS Date Route   Moderna COVID-19 Vaccine 01/23/2020  1:41 PM 0.5 mL 10/08/2019 Intramuscular   Manufacturer: Moderna   Lot: [id number]   [id number]

## 2020-02-01 ENCOUNTER — Other Ambulatory Visit: Payer: Self-pay | Admitting: Internal Medicine

## 2020-02-01 NOTE — Telephone Encounter (Signed)
Please refill as per office routine med refill policy (all routine meds refilled for 3 mo or monthly per pt preference up to one year from last visit, then month to month grace period for 3 mo, then further med refills will have to be denied)  

## 2020-02-12 ENCOUNTER — Other Ambulatory Visit: Payer: Self-pay | Admitting: Internal Medicine

## 2020-02-12 NOTE — Telephone Encounter (Signed)
Done erx  Please let pt know, due to office refill policy, no further refills until OV

## 2020-02-25 ENCOUNTER — Ambulatory Visit: Payer: BC Managed Care – PPO | Attending: Family

## 2020-02-25 DIAGNOSIS — Z23 Encounter for immunization: Secondary | ICD-10-CM

## 2020-02-25 NOTE — Progress Notes (Signed)
   Covid-19 Vaccination Clinic  Name:  Reianna Bergstresser    MRN: [id number] DOB: [birth year]  02/25/2020  Ms. Springer was observed post Covid-19 immunization for 15 minutes without incident. She was provided with Vaccine Information Sheet and instruction to access the V-Safe system.   Ms. Gregston was instructed to call 911 with any severe reactions post vaccine: Marland Kitchen Difficulty breathing  . Swelling of face and throat  . A fast heartbeat  . A bad rash all over body  . Dizziness and weakness   Immunizations Administered    Name Date Dose VIS Date Route   Moderna COVID-19 Vaccine 02/25/2020 12:08 PM 0.5 mL 10/2019 Intramuscular   Manufacturer: Moderna   Lot: [id number]   [id number]

## 2020-03-09 ENCOUNTER — Other Ambulatory Visit: Payer: Self-pay | Admitting: Internal Medicine

## 2020-03-10 ENCOUNTER — Telehealth: Payer: Self-pay | Admitting: Internal Medicine

## 2020-03-10 NOTE — Telephone Encounter (Signed)
Done erx 

## 2020-04-07 ENCOUNTER — Other Ambulatory Visit: Payer: Self-pay | Admitting: Family

## 2020-04-07 ENCOUNTER — Ambulatory Visit: Payer: BC Managed Care – PPO | Admitting: Family

## 2020-04-07 MED ORDER — CLONAZEPAM 1 MG PO TABS: ORAL_TABLET | ORAL | 0 refills | Status: DC

## 2020-04-07 NOTE — Telephone Encounter (Signed)
She is overdue to see Dr. Jenny Reichmann- she has not seen him since January 2020; I will give her a short term refill on Klonopin and she needs to come see him next week.

## 2020-04-08 ENCOUNTER — Other Ambulatory Visit: Payer: Self-pay | Admitting: Internal Medicine

## 2020-04-08 MED ORDER — MONTELUKAST SODIUM 10 MG PO TABS: 10.0000 mg | ORAL_TABLET | Freq: Every day | ORAL | 0 refills | Status: DC

## 2020-04-08 NOTE — Telephone Encounter (Signed)
Reviewed chart pt is scheduled fpr appt 6/23. Will send 30 day supply until appt...Laurie Solomon

## 2020-04-08 NOTE — Telephone Encounter (Signed)
    1.Medication Requested: montelukast (SINGULAIR) 10 MG tablet  2. Pharmacy (Name, Street, City):[facility] - [place], [place] - [place]  3. On Med List: yes  4. Last Visit with PCP: 12/03/18  5. Next visit date with PCP: 0623/21   Agent: Please be advised that RX refills may take up to 3 business days. We ask that you follow-up with your pharmacy.

## 2020-04-29 ENCOUNTER — Ambulatory Visit (INDEPENDENT_AMBULATORY_CARE_PROVIDER_SITE_OTHER): Payer: BC Managed Care – PPO | Admitting: Internal Medicine

## 2020-04-29 ENCOUNTER — Other Ambulatory Visit (INDEPENDENT_AMBULATORY_CARE_PROVIDER_SITE_OTHER): Payer: BC Managed Care – PPO

## 2020-04-29 ENCOUNTER — Other Ambulatory Visit: Payer: Self-pay

## 2020-04-29 ENCOUNTER — Encounter: Payer: Self-pay | Admitting: Internal Medicine

## 2020-04-29 VITALS — BP 110/80 | HR 75 | Temp 98.6°F | Ht 68.0 in | Wt 202.0 lb

## 2020-04-29 DIAGNOSIS — K811 Chronic cholecystitis: Secondary | ICD-10-CM

## 2020-04-29 DIAGNOSIS — E538 Deficiency of other specified B group vitamins: Secondary | ICD-10-CM

## 2020-04-29 DIAGNOSIS — Z1159 Encounter for screening for other viral diseases: Secondary | ICD-10-CM

## 2020-04-29 DIAGNOSIS — E559 Vitamin D deficiency, unspecified: Secondary | ICD-10-CM

## 2020-04-29 DIAGNOSIS — E039 Hypothyroidism, unspecified: Secondary | ICD-10-CM | POA: Diagnosis not present

## 2020-04-29 DIAGNOSIS — Z0000 Encounter for general adult medical examination without abnormal findings: Secondary | ICD-10-CM

## 2020-04-29 DIAGNOSIS — F4310 Post-traumatic stress disorder, unspecified: Secondary | ICD-10-CM

## 2020-04-29 LAB — CBC WITH DIFFERENTIAL/PLATELET
Basophils Absolute: 0.1 10*3/uL (ref 0.0–0.1)
Basophils Relative: 1.1 % (ref 0.0–3.0)
Eosinophils Absolute: 0.1 10*3/uL (ref 0.0–0.7)
Eosinophils Relative: 1.3 % (ref 0.0–5.0)
HCT: 42.3 % (ref 36.0–46.0)
Hemoglobin: 14.6 g/dL (ref 12.0–15.0)
Lymphocytes Relative: 35.2 % (ref 12.0–46.0)
Lymphs Abs: 2.8 10*3/uL (ref 0.7–4.0)
MCHC: 34.4 g/dL (ref 30.0–36.0)
MCV: 86.8 fl (ref 78.0–100.0)
Monocytes Absolute: 0.5 10*3/uL (ref 0.1–1.0)
Monocytes Relative: 5.9 % (ref 3.0–12.0)
Neutro Abs: 4.5 10*3/uL (ref 1.4–7.7)
Neutrophils Relative %: 56.5 % (ref 43.0–77.0)
Platelets: 271 10*3/uL (ref 150.0–400.0)
RBC: 4.88 Mil/uL (ref 3.87–5.11)
RDW: 12.5 % (ref 11.5–15.5)
WBC: 7.9 10*3/uL (ref 4.0–10.5)

## 2020-04-29 LAB — URINALYSIS, ROUTINE W REFLEX MICROSCOPIC
Bilirubin Urine: NEGATIVE
Hgb urine dipstick: NEGATIVE
Ketones, ur: NEGATIVE
Leukocytes,Ua: NEGATIVE
Nitrite: NEGATIVE
Specific Gravity, Urine: <=1.005 — AB (ref 1.000–1.030)
Total Protein, Urine: NEGATIVE
Urine Glucose: NEGATIVE
Urobilinogen, UA: 0.2 (ref 0.0–1.0)
pH: 6 (ref 5.0–8.0)

## 2020-04-29 LAB — LIPID PANEL
Cholesterol: 208 mg/dL — ABNORMAL HIGH (ref 0–200)
HDL: 26.2 mg/dL — ABNORMAL LOW (ref >39.00)
LDL Cholesterol: 147 mg/dL — ABNORMAL HIGH (ref 0–99)
NonHDL: 181.43
Total CHOL/HDL Ratio: 8
Triglycerides: 172 mg/dL — ABNORMAL HIGH (ref 0.0–149.0)
VLDL: 34.4 mg/dL (ref 0.0–40.0)

## 2020-04-29 LAB — BASIC METABOLIC PANEL
BUN: 10 mg/dL (ref 6–23)
CO2: 28 mEq/L (ref 19–32)
Calcium: 9.4 mg/dL (ref 8.4–10.5)
Chloride: 103 mEq/L (ref 96–112)
Creatinine, Ser: 0.78 mg/dL (ref 0.40–1.20)
GFR: 78.39 mL/min (ref >60.00)
Glucose, Bld: 91 mg/dL (ref 70–99)
Potassium: 4.2 mEq/L (ref 3.5–5.1)
Sodium: 137 mEq/L (ref 135–145)

## 2020-04-29 LAB — T4, FREE: Free T4: 0.88 ng/dL (ref 0.60–1.60)

## 2020-04-29 LAB — HEPATIC FUNCTION PANEL
ALT: 28 U/L (ref 0–35)
AST: 20 U/L (ref 0–37)
Albumin: 4.4 g/dL (ref 3.5–5.2)
Alkaline Phosphatase: 114 U/L (ref 39–117)
Bilirubin, Direct: 0.1 mg/dL (ref 0.0–0.3)
Total Bilirubin: 0.4 mg/dL (ref 0.2–1.2)
Total Protein: 7.4 g/dL (ref 6.0–8.3)

## 2020-04-29 LAB — VITAMIN B12: Vitamin B-12: 389 pg/mL (ref 211–911)

## 2020-04-29 LAB — TSH: TSH: 1.59 u[IU]/mL (ref 0.35–4.50)

## 2020-04-29 LAB — VITAMIN D 25 HYDROXY (VIT D DEFICIENCY, FRACTURES): VITD: 41.77 ng/mL (ref 30.00–100.00)

## 2020-04-29 MED ORDER — MONTELUKAST SODIUM 10 MG PO TABS: 10.0000 mg | ORAL_TABLET | Freq: Every day | ORAL | 3 refills | Status: DC

## 2020-04-29 MED ORDER — CLONAZEPAM 1 MG PO TABS: ORAL_TABLET | ORAL | 5 refills | Status: DC

## 2020-04-29 MED ORDER — BUPROPION HCL ER (XL) 300 MG PO TB24: 300.0000 mg | ORAL_TABLET | Freq: Every day | ORAL | 3 refills | Status: DC

## 2020-04-29 MED ORDER — LEVOTHYROXINE SODIUM 25 MCG PO TABS: 25.0000 ug | ORAL_TABLET | Freq: Every day | ORAL | 3 refills | Status: DC

## 2020-04-29 MED ORDER — ALBUTEROL SULFATE HFA 108 (90 BASE) MCG/ACT IN AERS: 1.0000 | INHALATION_SPRAY | Freq: Four times a day (QID) | RESPIRATORY_TRACT | 3 refills | Status: DC | PRN

## 2020-04-29 NOTE — Patient Instructions (Signed)
Please continue all other medications as before, and refills have been done  Please have the pharmacy call with any other refills you may need.  Please continue your efforts at being more active, low cholesterol diet, and weight control.  You are otherwise up to date with prevention measures today.  Please keep your appointments with your specialists as you may have planned  You will be contacted regarding the referral for: colonoscopy, and General Surgury  Please go to the LAB at the blood drawing area for the tests to be done - at the [facility] will be contacted by phone if any changes need to be made immediately.  Otherwise, you will receive a letter about your results with an explanation, but please check with MyChart first.  Please remember to sign up for MyChart if you have not done so, as this will be important to you in the future with finding out test results, communicating by private email, and scheduling acute appointments online when needed.  Please make an Appointment to return for your 1 year visit, or sooner if needed

## 2020-04-29 NOTE — Progress Notes (Signed)
Subjective:    Patient ID: Laurie Solomon, female    DOB: 10-23-1971, 49 y.o.   MRN: [id number]  HPI  Here for wellness and f/u;  Overall doing ok;  Pt denies Chest pain, worsening SOB, DOE, wheezing, orthopnea, PND, worsening LE edema, palpitations, dizziness or syncope.  Pt denies neurological change such as new headache, facial or extremity weakness.  Pt denies polydipsia, polyuria, or low sugar symptoms. Pt states overall good compliance with treatment and medications, good tolerability, and has been trying to follow appropriate diet.  Pt denies worsening depressive symptoms, suicidal ideation or panic. No fever, night sweats, wt loss, loss of appetite, or other constitutional symptoms.  Pt states good ability with ADL's, has low fall risk, home safety reviewed and adequate, no other significant changes in hearing or vision, and only occasionally active with exercise.  Needs referral for GB to general surgury. Denies hyper or hypo thyroid symptoms such as voice, skin or hair change.   Past Medical History:  Diagnosis Date  . Allergic rhinitis   . Asthma    childhood  . Chronic insomnia   . Headache(784.0)   . Hypothyroidism 07/09/2015  . Post traumatic stress disorder   . Vitamin D deficiency 07/09/2015   Past Surgical History:  Procedure Laterality Date  . REFRACTIVE SURGERY      reports that she has never smoked. She has never used smokeless tobacco. She reports current alcohol use of about 2.0 standard drinks of alcohol per week. She reports that she does not use drugs. family history includes Breast cancer in some other family members; Diabetes in her paternal grandfather; Heart disease in her maternal grandfather; Hypertension in her maternal grandfather. No Known Allergies Current Outpatient Medications on File Prior to Visit  Medication Sig Dispense Refill  . Insulin Pen Needle 32G X 4 MM MISC Use to administer the Saxenda once a day 30 each 5  . Multiple Vitamin (MULTIVITAMIN)  tablet Take 1 tablet by mouth daily.      . NONFORMULARY OR COMPOUNDED ITEM Allergy Vaccine 1:10 Given at [facility]    . Saw Palmetto, Serenoa repens, (SAW PALMETTO PO) Take 200 mg by mouth daily.    Marland Kitchen SAXENDA 18 MG/3ML SOPN INJECT 0.5 MLS INTO THE SKIN DAILY. ANNUAL APPT IS DUE FOR FUTURE REFILLS 15 pen 3   No current facility-administered medications on file prior to visit.   Wt Readings from Last 3 Encounters:  04/29/20 202 lb (91.6 kg)  12/03/18 193 lb (87.5 kg)  07/30/18 211 lb (95.7 kg)   Review of Systems All otherwise neg per pt     Objective:   Physical Exam BP 110/80 (BP Location: Left Arm, Patient Position: Sitting, Cuff Size: Large)   Pulse 75   Temp 98.6 F (37 C) (Oral)   Ht 5\' 8"  (1.727 m)   Wt 202 lb (91.6 kg)   SpO2 98%   BMI 30.71 kg/m  VS noted,  Constitutional: Pt appears in NAD HENT: Head: NCAT.  Right Ear: External ear normal.  Left Ear: External ear normal.  Eyes: . Pupils are equal, round, and reactive to light. Conjunctivae and EOM are normal Nose: without d/c or deformity Neck: Neck supple. Gross normal ROM Cardiovascular: Normal rate and regular rhythm.   Pulmonary/Chest: Effort normal and breath sounds without rales or wheezing.  Abd:  Soft, NT, ND, + BS, no organomegaly Neurological: Pt is alert. At baseline orientation, motor grossly intact Skin: Skin is warm. No rashes, other new lesions,  no LE edema Psychiatric: Pt behavior is normal without agitation  All otherwise neg per pt  Lab Results  Component Value Date   WBC 8.9 12/03/2018   HGB 14.8 12/03/2018   HCT 43.8 12/03/2018   PLT 272.0 12/03/2018   GLUCOSE 82 12/03/2018   CHOL 177 07/30/2018   TRIG 130.0 07/30/2018   HDL 37.30 (L) 07/30/2018   LDLCALC 114 (H) 07/30/2018   ALT 321 (H) 12/03/2018   AST 88 (H) 12/03/2018   NA 138 12/03/2018   K 4.0 12/03/2018   CL 103 12/03/2018   CREATININE 0.98 12/03/2018   BUN 7 12/03/2018   CO2 27 12/03/2018   TSH 2.02  07/30/2018       Assessment & Plan:

## 2020-04-30 LAB — HEPATITIS C ANTIBODY
Hepatitis C Ab: NONREACTIVE
SIGNAL TO CUT-OFF: 0.01 (ref <1.00)

## 2020-05-05 ENCOUNTER — Encounter: Payer: Self-pay | Admitting: Internal Medicine

## 2020-05-05 NOTE — Assessment & Plan Note (Signed)
For oral replacement 

## 2020-05-05 NOTE — Assessment & Plan Note (Signed)
stable overall by history and exam, recent data reviewed with pt, and pt to continue medical treatment as before,  to f/u any worsening symptoms or concerns  

## 2020-05-05 NOTE — Assessment & Plan Note (Signed)

## 2020-05-05 NOTE — Assessment & Plan Note (Signed)
For general surgury referral

## 2020-05-06 ENCOUNTER — Encounter: Payer: Self-pay | Admitting: Internal Medicine

## 2020-05-06 DIAGNOSIS — R0683 Snoring: Secondary | ICD-10-CM

## 2020-05-16 ENCOUNTER — Encounter: Payer: Self-pay | Admitting: Internal Medicine

## 2020-06-03 ENCOUNTER — Encounter: Payer: Self-pay | Admitting: Internal Medicine

## 2020-06-10 ENCOUNTER — Other Ambulatory Visit (HOSPITAL_BASED_OUTPATIENT_CLINIC_OR_DEPARTMENT_OTHER): Payer: Self-pay

## 2020-06-10 DIAGNOSIS — R0683 Snoring: Secondary | ICD-10-CM

## 2020-07-24 ENCOUNTER — Encounter: Payer: Self-pay | Admitting: Internal Medicine

## 2020-07-25 ENCOUNTER — Encounter (HOSPITAL_BASED_OUTPATIENT_CLINIC_OR_DEPARTMENT_OTHER): Payer: BC Managed Care – PPO | Admitting: Internal Medicine

## 2020-07-25 ENCOUNTER — Encounter (HOSPITAL_COMMUNITY): Payer: Self-pay

## 2020-07-25 ENCOUNTER — Ambulatory Visit (HOSPITAL_COMMUNITY)
Admission: EM | Admit: 2020-07-25 | Discharge: 2020-07-25 | Disposition: A | Discharge status: Home / Self Care | Payer: BC Managed Care – PPO | Attending: Family Medicine | Admitting: Family Medicine

## 2020-07-25 ENCOUNTER — Other Ambulatory Visit: Payer: Self-pay

## 2020-07-25 DIAGNOSIS — L249 Irritant contact dermatitis, unspecified cause: Secondary | ICD-10-CM

## 2020-07-25 MED ORDER — FLUOCINONIDE 0.1 % EX CREA: TOPICAL_CREAM | CUTANEOUS | 0 refills | Status: DC

## 2020-07-25 MED ORDER — BETAMETHASONE VALERATE 0.1 % EX OINT: 1.0000 "application " | TOPICAL_OINTMENT | Freq: Two times a day (BID) | CUTANEOUS | 0 refills | Status: DC

## 2020-07-25 MED ORDER — PREDNISONE 20 MG PO TABS: 40.0000 mg | ORAL_TABLET | Freq: Every day | ORAL | 0 refills | Status: DC

## 2020-07-25 MED ORDER — DEXAMETHASONE SODIUM PHOSPHATE 10 MG/ML IJ SOLN
INTRAMUSCULAR | Status: AC
  Filled 2020-07-25: qty 1

## 2020-07-25 MED ORDER — DEXAMETHASONE SODIUM PHOSPHATE 10 MG/ML IJ SOLN
10.0000 mg | Freq: Once | INTRAMUSCULAR | Status: AC
  Administered 2020-07-25: 10 mg via INTRAMUSCULAR

## 2020-07-25 MED ORDER — HYDROXYZINE HCL 10 MG PO TABS: 10.0000 mg | ORAL_TABLET | Freq: Three times a day (TID) | ORAL | 0 refills | Status: DC | PRN

## 2020-07-25 NOTE — Discharge Instructions (Addendum)
Received a Decadron injection today.  If symptoms completely resolve do not start the oral prednisone that I placed on file at your pharmacy.  I have prescribed you the betamethasone topical ointment to apply directly to the rash.  Do not use for greater than 7 days.

## 2020-07-25 NOTE — ED Triage Notes (Signed)
Pt present a rash that has spreads all over her body, the rash is red and itching. Symptom developed on Thursday.
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
## 2020-07-26 ENCOUNTER — Ambulatory Visit: Payer: Self-pay

## 2020-07-27 ENCOUNTER — Encounter: Payer: Self-pay | Admitting: Internal Medicine

## 2020-07-27 MED ORDER — PREDNISONE 20 MG PO TABS: 40.0000 mg | ORAL_TABLET | Freq: Every day | ORAL | 0 refills | Status: AC

## 2020-08-03 ENCOUNTER — Other Ambulatory Visit: Payer: Self-pay

## 2020-08-03 MED ORDER — SAXENDA 18 MG/3ML ~~LOC~~ SOPN: PEN_INJECTOR | SUBCUTANEOUS | 1 refills | Status: DC

## 2020-09-06 ENCOUNTER — Encounter: Payer: Self-pay | Admitting: Internal Medicine

## 2020-09-06 DIAGNOSIS — M25569 Pain in unspecified knee: Secondary | ICD-10-CM

## 2020-10-04 ENCOUNTER — Encounter: Payer: Self-pay | Admitting: Emergency Medicine

## 2020-10-04 ENCOUNTER — Ambulatory Visit
Admission: EM | Admit: 2020-10-04 | Discharge: 2020-10-04 | Disposition: A | Discharge status: Home / Self Care | Payer: BC Managed Care – PPO | Attending: Emergency Medicine | Admitting: Emergency Medicine

## 2020-10-04 ENCOUNTER — Other Ambulatory Visit: Payer: Self-pay

## 2020-10-04 DIAGNOSIS — H6691 Otitis media, unspecified, right ear: Secondary | ICD-10-CM | POA: Diagnosis not present

## 2020-10-04 MED ORDER — AMOXICILLIN 500 MG PO CAPS: 500.0000 mg | ORAL_CAPSULE | Freq: Two times a day (BID) | ORAL | 0 refills | Status: DC

## 2020-10-04 NOTE — ED Triage Notes (Signed)
Pt here for right ear fullness starting last night

## 2020-10-04 NOTE — Discharge Instructions (Signed)
Take antibiotic as prescribed for the next week. °Return for worsening ear pain, swelling, discharge, bleeding, decreased hearing, development of jaw pain/swelling, fever. ° °Do NOT use Q-tips as these can cause your ear wax to get stuck, the tips may break off and become a foreign body requiring additional medical care, or puncture your eardrum. ° °Helpful prevention tip: °Use a solution of equal parts isopropyl (rubbing) alcohol and white vinegar (acetic acid) in both ears after swimming. °
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
## 2020-10-05 DIAGNOSIS — J310 Chronic rhinitis: Secondary | ICD-10-CM | POA: Insufficient documentation

## 2020-10-05 DIAGNOSIS — H6993 Unspecified Eustachian tube disorder, bilateral: Secondary | ICD-10-CM | POA: Insufficient documentation

## 2020-10-05 DIAGNOSIS — H66011 Acute suppurative otitis media with spontaneous rupture of ear drum, right ear: Secondary | ICD-10-CM | POA: Insufficient documentation

## 2020-10-08 ENCOUNTER — Encounter: Payer: Self-pay | Admitting: Internal Medicine

## 2020-10-08 ENCOUNTER — Ambulatory Visit: Payer: BC Managed Care – PPO | Admitting: Internal Medicine

## 2020-10-08 ENCOUNTER — Other Ambulatory Visit: Payer: Self-pay

## 2020-10-08 DIAGNOSIS — S0086XA Insect bite (nonvenomous) of other part of head, initial encounter: Secondary | ICD-10-CM | POA: Diagnosis not present

## 2020-10-08 DIAGNOSIS — W57XXXA Bitten or stung by nonvenomous insect and other nonvenomous arthropods, initial encounter: Secondary | ICD-10-CM

## 2020-10-08 DIAGNOSIS — E559 Vitamin D deficiency, unspecified: Secondary | ICD-10-CM | POA: Diagnosis not present

## 2020-10-08 DIAGNOSIS — F32A Depression, unspecified: Secondary | ICD-10-CM

## 2020-10-08 DIAGNOSIS — H9201 Otalgia, right ear: Secondary | ICD-10-CM | POA: Insufficient documentation

## 2020-10-08 MED ORDER — TRIAMCINOLONE ACETONIDE 0.1 % EX CREA: 1.0000 "application " | TOPICAL_CREAM | Freq: Two times a day (BID) | CUTANEOUS | 0 refills | Status: DC

## 2020-10-08 MED ORDER — ACETAMINOPHEN-CODEINE 300-30 MG PO TABS
1.0000 | ORAL_TABLET | Freq: Four times a day (QID) | ORAL | 0 refills | Status: AC | PRN
Start: 2020-10-08 — End: 2020-10-15

## 2020-10-08 NOTE — Patient Instructions (Signed)
Please take all new medication as prescribed - the pain medication, and the steroid cream  You will be contacted regarding the referral for: ENT - Dr Lucia Gaskins for next week  Please continue all other medications as before, and refills have been done if requested.  Please have the pharmacy call with any other refills you may need.  Please continue your efforts at being more active, low cholesterol diet, and weight control..  Please keep your appointments with your specialists as you may have planned

## 2020-10-08 NOTE — Progress Notes (Signed)
Subjective:    Patient ID: Laurie Solomon, female    DOB: September 11, 1971, 49 y.o.   MRN: [id number]  HPI  Here to f/u with unable to hear from right ear after recent Seen and tx in UC with right otitis media, tried vinegar to right ear, then severe pain and saw ENT with finding of perforated right tm, placed on cipro gtt in addition to prior amoxil.  Pain still severe though slight improved today.  Very upset has been calling Dr Claiborne Rigg office but no response for something for pain  Would rather not see [facility] again, as f/u was put off to dec 28 fu appt as well.  Pt denies chest pain, increased sob or doe, wheezing, orthopnea, PND, increased LE swelling, palpitations, dizziness or syncope.  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  Also incidentally with multiple small insect bites to extremities, asks for triam cr, seems to happen every fall. Past Medical History:  Diagnosis Date  . Allergic rhinitis   . Asthma    childhood  . Chronic insomnia   . Headache(784.0)   . Hypothyroidism 07/09/2015  . Post traumatic stress disorder   . Vitamin D deficiency 07/09/2015   Past Surgical History:  Procedure Laterality Date  . REFRACTIVE SURGERY      reports that she has never smoked. She has never used smokeless tobacco. She reports current alcohol use of about 2.0 standard drinks of alcohol per week. She reports that she does not use drugs. family history includes Breast cancer in some other family members; Diabetes in her paternal grandfather; Heart disease in her maternal grandfather; Hypertension in her maternal grandfather. No Known Allergies Current Outpatient Medications on File Prior to Visit  Medication Sig Dispense Refill  . albuterol (VENTOLIN HFA) 108 (90 Base) MCG/ACT inhaler Inhale 1-2 puffs into the lungs every 6 (six) hours as needed for  shortness of breath. 54 g 3  . buPROPion (WELLBUTRIN XL) 300 MG 24 hr tablet Take 1 tablet (300 mg total) by mouth daily. 90 tablet 3  . ciprofloxacin-dexamethasone (CIPRODEX) OTIC suspension Place 4 drops into the right ear 2 times daily for 7 days.    . clonazePAM (KLONOPIN) 1 MG tablet TAKE 1/2 TABLET BY MOUTH IN THE AM AND 1 TABLET AT BEDTIME 60 tablet 5  . Insulin Pen Needle 32G X 4 MM MISC Use to administer the Saxenda once a day 30 each 5  . levothyroxine (SYNTHROID) 100 MCG SOLR injection Inject into the vein.    . Liraglutide -Weight Management (SAXENDA) 18 MG/3ML SOPN INJECT 0.5 MLS INTO THE SKIN DAILY. ANNUAL APPT IS DUE FOR FUTURE REFILLS 0.5 mL 1  . meloxicam (MOBIC) 7.5 MG tablet Take 7.5 mg by mouth daily.    . montelukast (SINGULAIR) 10 MG tablet Take 1 tablet (10 mg total) by mouth at bedtime. 90 tablet 3  . Multiple Vitamin (MULTIVITAMIN) tablet Take 1 tablet by mouth daily.      . NONFORMULARY OR COMPOUNDED ITEM Allergy Vaccine 1:10 Given at [facility]    . permethrin (ELIMITE) 5 % cream Apply 1 application topically daily.    . sertraline (ZOLOFT) 100 MG tablet Take by mouth.    . tretinoin (RETIN-A) 0.1 % cream Apply 1 application topically at bedtime.    Marland Kitchen VANIQA 13.9 % cream Apply topically at bedtime.    Marland Kitchen  XANAX 0.25 MG tablet Take by mouth.     No current facility-administered medications on file prior to visit.  , Review of Systems All otherwise neg per pt    Objective:   Physical Exam BP 122/86 (BP Location: Left Arm, Patient Position: Sitting, Cuff Size: Large)   Pulse 82   Temp 98 F (36.7 C) (Oral)   Ht 5\' 8"  (1.727 m)   Wt 203 lb (92.1 kg)   SpO2 96%   BMI 30.87 kg/m  VS noted,  Constitutional: Pt appears in NAD HENT: Head: NCAT.  Right Ear: External ear normal. Right canal with 1+ swelling, no d/c Left Ear: External ear normal.  Eyes: . Pupils are equal, round, and reactive to light. Conjunctivae and EOM are normal Nose: without d/c or  deformity Neck: Neck supple. Gross normal ROM Cardiovascular: Normal rate and regular rhythm.   Pulmonary/Chest: Effort normal and breath sounds without rales or wheezing.  Neurological: Pt is alert. At baseline orientation, motor grossly intact Skin: Skin is warm., no LE edema, small insect bites to extremities multiple noted Psychiatric: Pt behavior is normal without agitation  All otherwise neg per pt Lab Results  Component Value Date   WBC 7.9 04/29/2020   HGB 14.6 04/29/2020   HCT 42.3 04/29/2020   PLT 271.0 04/29/2020   GLUCOSE 91 04/29/2020   CHOL 208 (H) 04/29/2020   TRIG 172.0 (H) 04/29/2020   HDL 26.20 (L) 04/29/2020   LDLCALC 147 (H) 04/29/2020   ALT 28 04/29/2020   AST 20 04/29/2020   NA 137 04/29/2020   K 4.2 04/29/2020   CL 103 04/29/2020   CREATININE 0.78 04/29/2020   BUN 10 04/29/2020   CO2 28 04/29/2020   TSH 1.59 04/29/2020       Assessment & Plan:

## 2020-10-10 ENCOUNTER — Encounter: Payer: Self-pay | Admitting: Internal Medicine

## 2020-10-10 DIAGNOSIS — W57XXXA Bitten or stung by nonvenomous insect and other nonvenomous arthropods, initial encounter: Secondary | ICD-10-CM | POA: Insufficient documentation

## 2020-10-10 DIAGNOSIS — F32A Depression, unspecified: Secondary | ICD-10-CM | POA: Insufficient documentation

## 2020-10-10 NOTE — Assessment & Plan Note (Signed)
For triam cr prn 

## 2020-10-10 NOTE — Assessment & Plan Note (Addendum)
With uncontrolled pain, for tylenol #3, refer new ENT group  I spent 31 minutes in preparing to see the patient by review of recent labs, imaging and procedures, obtaining and reviewing separately obtained history, communicating with the patient and family or caregiver, ordering medications, tests or procedures, and documenting clinical information in the EHR including the differential Dx, treatment, and any further evaluation and other management of right ear pain, vit d def, depresision, insect bite

## 2020-10-10 NOTE — Assessment & Plan Note (Signed)
Cont vitd d3 2000 qd

## 2020-10-10 NOTE — Assessment & Plan Note (Signed)
Mild, delcines further tx forn ow

## 2020-10-22 ENCOUNTER — Ambulatory Visit (INDEPENDENT_AMBULATORY_CARE_PROVIDER_SITE_OTHER): Payer: BC Managed Care – PPO | Admitting: Otolaryngology

## 2020-10-22 ENCOUNTER — Encounter (INDEPENDENT_AMBULATORY_CARE_PROVIDER_SITE_OTHER): Payer: Self-pay | Admitting: Otolaryngology

## 2020-10-22 ENCOUNTER — Other Ambulatory Visit: Payer: Self-pay

## 2020-10-22 VITALS — Temp 97.5°F

## 2020-10-22 DIAGNOSIS — Z8669 Personal history of other diseases of the nervous system and sense organs: Secondary | ICD-10-CM | POA: Diagnosis not present

## 2020-10-22 NOTE — Progress Notes (Signed)
HPI: Laurie Solomon is a 49 y.o. female who presents is referred by Dr. Jenny Reichmann for evaluation of ruptured right TM.  Patient apparently developed an acute right ear infection.  She was initially seen in urgent care and instructed to use vinegar eardrops that caused intense burning in the right ear.  She was treated with antibiotics as well as antibiotic eardrops and is doing much better presently.  She is diagnosed with acute otitis media with ruptured TM and presents here to have this checked..  Past Medical History:  Diagnosis Date  . Allergic rhinitis   . Asthma    childhood  . Chronic insomnia   . Headache(784.0)   . Hypothyroidism 07/09/2015  . Post traumatic stress disorder   . Vitamin D deficiency 07/09/2015   Past Surgical History:  Procedure Laterality Date  . REFRACTIVE SURGERY     Social History   Socioeconomic History  . Marital status: Married    Spouse name: Not on file  . Number of children: 1  . Years of education: Not on file  . Highest education level: Not on file  Occupational History  . 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 Smoking status: Never Smoker  . Smokeless tobacco: Never Used  Substance and Sexual Activity  . Alcohol use: Yes    Alcohol/week: 2.0 standard drinks    Types: 2 drink(s) per week  . Drug use: No  . Sexual activity: Not on file  Other Topics Concern  . Not on file  Social History Narrative   Son is 67 months old         Social Determinants of Radio broadcast assistant Strain: Not on file  Food Insecurity: Not on file  Transportation Needs: Not on file  Physical Activity: Not on file  Stress: Not on file  Social Connections: Not on file   Family History  Problem Relation Age of Onset  . Heart disease Maternal Grandfather   . Hypertension Maternal Grandfather   . Diabetes Paternal Grandfather   . Breast cancer Other        maternal cousin  . Breast cancer Other        great aunt   No Known Allergies Prior to  Admission medications   Medication Sig Start Date End Date Taking? Authorizing Provider  albuterol (VENTOLIN HFA) 108 (90 Base) MCG/ACT inhaler Inhale 1-2 puffs into the lungs every 6 (six) hours as needed for shortness of breath. 04/29/20   Biagio Borg, MD  buPROPion (WELLBUTRIN XL) 300 MG 24 hr tablet Take 1 tablet (300 mg total) by mouth daily. 04/29/20   Biagio Borg, MD  clonazePAM (KLONOPIN) 1 MG tablet TAKE 1/2 TABLET BY MOUTH IN THE AM AND 1 TABLET AT BEDTIME 04/29/20   Biagio Borg, MD  Insulin Pen Needle 32G X 4 MM MISC Use to administer the Saxenda once a day 05/23/18   Biagio Borg, MD  levothyroxine (SYNTHROID) 100 MCG SOLR injection Inject into the vein. 09/03/20   [provider]  Liraglutide -Weight Management (SAXENDA) 18 MG/3ML SOPN INJECT 0.5 MLS INTO THE SKIN DAILY. ANNUAL APPT IS DUE FOR FUTURE REFILLS 08/03/20   Biagio Borg, MD  meloxicam (MOBIC) 7.5 MG tablet Take 7.5 mg by mouth daily. 09/22/20   [provider]  montelukast (SINGULAIR) 10 MG tablet Take 1 tablet (10 mg total) by mouth at bedtime. 04/29/20   Biagio Borg, MD  Multiple Vitamin (MULTIVITAMIN) tablet Take 1 tablet  by mouth daily.      [provider]  NONFORMULARY OR COMPOUNDED ITEM Allergy Vaccine 1:10 Given at [facility]    [provider]  permethrin (ELIMITE) 5 % cream Apply 1 application topically daily. 09/03/20   [provider]  sertraline (ZOLOFT) 100 MG tablet Take by mouth. 09/03/20   [provider]  tretinoin (RETIN-A) 0.1 % cream Apply 1 application topically at bedtime. 10/04/20   [provider]  triamcinolone (KENALOG) 0.1 % Apply 1 application topically 2 (two) times daily. 10/08/20   Biagio Borg, MD  VANIQA 13.9 % cream Apply topically at bedtime. 09/03/20   [provider]  XANAX 0.25 MG tablet Take by mouth. 09/03/20   [provider]     Positive ROS: Otherwise negative  All other systems have  been reviewed and were otherwise negative with the exception of those mentioned in the HPI and as above.  Physical Exam: Constitutional: Alert, well-appearing, no acute distress Ears: External ears without lesions or tenderness.  Left ear canal and left TM are clear.  Right ear canal reveals some crusting and scabbing within the ear canal and on the TM that was cleaned in the office.  The TM is clear otherwise with good mobility on pneumatic otoscopy.  The perforation is entirely healed. Nasal: External nose without lesions. Septum with minimal deformity and mild rhinitis.  No signs of infection.. Clear nasal passages otherwise. Oral: Lips and gums without lesions. Tongue and palate mucosa without lesions. Posterior oropharynx clear. Neck: No palpable adenopathy or masses Respiratory: Breathing comfortably  Skin: No facial/neck lesions or rash noted.  Binocular microscopy  Date/Time: 10/22/2020 4:41 PM Performed by: Rozetta Nunnery, MD Authorized by: Rozetta Nunnery, MD   Consent:    Consent obtained:  Verbal   Consent given by:  Patient   Alternatives discussed:  No treatment Procedure details:    Indications: examination of tympanic membrane     Scope location: right ear     Right speculum size:  3 Right ear canal:    normal   Right tympanic membrane:    Mobility: fully mobile   Post-procedure details:    Patient tolerance of procedure:  Tolerated well, no immediate complications Comments:     On microscopic exam there is some mild crusting and scabbing on the TM as well as in the ear canal that was cleaned in the office with suction.  TM was otherwise intact and clear    Assessment: Status post acute right otitis media with perforation.  Infection has resolved with a healed right TM  Plan: No further treatment is needed.  She can stop the eardrops. She will follow-up as needed   Radene Journey, MD   CC:

## 2020-11-05 ENCOUNTER — Encounter: Payer: Self-pay | Admitting: Internal Medicine

## 2020-12-04 ENCOUNTER — Telehealth: Payer: Self-pay

## 2020-12-04 NOTE — Telephone Encounter (Signed)
Key: [id number])

## 2020-12-07 NOTE — Telephone Encounter (Signed)
PA was denied.  It was denied because they did not receive additional clinical information.   OV notes from 10/08/20 were submitted with the request.    You do not meet the requirements of your plan. Your plan covers this drug when you have met these conditions: - You are at least 50 years of age or older - You have completed at least 16 weeks of therapy with the requested drug - You lost at least 4 percent of your body weight or - You have continued to keep your initial 4 percent weight loss off Supporting documentation must be submitted. Your request has been denied based on the information we have

## 2020-12-08 ENCOUNTER — Telehealth: Payer: Self-pay

## 2020-12-08 NOTE — Telephone Encounter (Signed)
Notified pt of the saxdena denial

## 2020-12-15 ENCOUNTER — Encounter: Payer: Self-pay | Admitting: Internal Medicine

## 2020-12-16 ENCOUNTER — Telehealth: Payer: Self-pay

## 2020-12-16 ENCOUNTER — Other Ambulatory Visit: Payer: Self-pay | Admitting: Internal Medicine

## 2020-12-16 NOTE — Telephone Encounter (Signed)
New Prior Auth Key for Saxdena------[id number]

## 2020-12-16 NOTE — Telephone Encounter (Signed)
Covermymeds calling stating that it was denied, if we would like to resubmit we could  Phone # [phone] Key# [id number]

## 2020-12-16 NOTE — Telephone Encounter (Signed)
Did you look at the information from the telephone note 12/04/2020 on why the medication was denied?

## 2021-01-11 ENCOUNTER — Encounter: Payer: Self-pay | Admitting: Internal Medicine

## 2021-01-12 ENCOUNTER — Telehealth: Payer: Self-pay

## 2021-01-12 NOTE — Telephone Encounter (Signed)
notified patient that Dr. Jenny Reichmann is out the office once he returns I may began the prior auth for the new prescripition if it is approved by the provider.

## 2021-01-21 ENCOUNTER — Telehealth: Payer: Self-pay

## 2021-01-21 MED ORDER — WEGOVY 0.25 MG/0.5ML ~~LOC~~ SOAJ: 0.2500 mg | SUBCUTANEOUS | 3 refills | Status: DC

## 2021-01-21 NOTE — Telephone Encounter (Addendum)
Prior authorization completed for Wegovy    (KeyLowella Grip)  Approved for 01/21/2021-08/23/2021

## 2021-01-21 NOTE — Telephone Encounter (Signed)
Ok wegovy sent erx to [employer]

## 2021-02-15 ENCOUNTER — Encounter: Payer: Self-pay | Admitting: Internal Medicine

## 2021-02-15 MED ORDER — WEGOVY 0.5 MG/0.5ML ~~LOC~~ SOAJ: 0.5000 mg | SUBCUTANEOUS | 0 refills | Status: DC

## 2021-03-31 ENCOUNTER — Encounter: Payer: Self-pay | Admitting: Internal Medicine

## 2021-03-31 DIAGNOSIS — Z1211 Encounter for screening for malignant neoplasm of colon: Secondary | ICD-10-CM

## 2021-04-25 ENCOUNTER — Other Ambulatory Visit: Payer: Self-pay | Admitting: Internal Medicine

## 2021-04-25 NOTE — Telephone Encounter (Signed)
Please refill as per office routine med refill policy (all routine meds refilled for 3 mo or monthly per pt preference up to one year from last visit, then month to month grace period for 3 mo, then further med refills will have to be denied)  

## 2021-04-30 ENCOUNTER — Ambulatory Visit (AMBULATORY_SURGERY_CENTER): Payer: Self-pay | Admitting: *Deleted

## 2021-04-30 ENCOUNTER — Other Ambulatory Visit: Payer: Self-pay

## 2021-04-30 VITALS — Ht 67.0 in | Wt 200.0 lb

## 2021-04-30 DIAGNOSIS — Z1211 Encounter for screening for malignant neoplasm of colon: Secondary | ICD-10-CM

## 2021-04-30 NOTE — Progress Notes (Signed)
Pt verified name, DOB, address and insurance during PV today. Pt mailed instruction packet to included paper to complete and mail back to [facility] with addressed and stamped envelope, Emmi video, copy of consent form to read and not return, and instructions.. PV completed over the phone. Pt encouraged to call with questions or issues. My Chart instructions to pt as well     No egg or soy allergy known to patient  No issues with past sedation with any surgeries or procedures Patient denies ever being told they had issues or difficulty with intubation  No FH of Malignant Hyperthermia No diet pills per patient No home 02 use per patient  No blood thinners per patient  Pt denies issues with constipation  No A fib or A flutter  EMMI video to pt or via MyChart  COVID 19 guidelines implemented in PV today with Pt and RN  Pt is fully vaccinated  for Covid   Due to the COVID-19 pandemic we are asking patients to follow certain guidelines.  Pt aware of COVID protocols and [facility] guidelines   

## 2021-05-13 ENCOUNTER — Ambulatory Visit (AMBULATORY_SURGERY_CENTER): Payer: BC Managed Care – PPO | Admitting: Internal Medicine

## 2021-05-13 ENCOUNTER — Other Ambulatory Visit: Payer: Self-pay

## 2021-05-13 ENCOUNTER — Encounter: Payer: Self-pay | Admitting: Internal Medicine

## 2021-05-13 VITALS — BP 126/74 | HR 82 | Temp 97.7°F | Resp 21 | Ht 68.0 in

## 2021-05-13 DIAGNOSIS — Z1211 Encounter for screening for malignant neoplasm of colon: Secondary | ICD-10-CM

## 2021-05-13 DIAGNOSIS — Z860101 Personal history of adenomatous and serrated colon polyps: Secondary | ICD-10-CM

## 2021-05-13 DIAGNOSIS — D122 Benign neoplasm of ascending colon: Secondary | ICD-10-CM

## 2021-05-13 DIAGNOSIS — Z86010 Personal history of colonic polyps: Secondary | ICD-10-CM

## 2021-05-13 HISTORY — PX: COLONOSCOPY W/ POLYPECTOMY: SHX1380

## 2021-05-13 HISTORY — DX: Personal history of adenomatous and serrated colon polyps: Z86.0101

## 2021-05-13 HISTORY — DX: Personal history of colonic polyps: Z86.010

## 2021-05-13 MED ORDER — SODIUM CHLORIDE 0.9 % IV SOLN: 500.0000 mL | Freq: Once | INTRAVENOUS | Status: DC

## 2021-05-13 NOTE — Op Note (Signed)
[facility] Patient Name: Laurie Solomon Procedure Date: 05/13/2021 8:45 AM MRN: [id number] Endoscopist: Gatha Mayer , MD Age: 50 Referring MD:  Date of Birth: [birth year] Gender: Female Account #: [id number] Procedure:                Colonoscopy Indications:              Screening for colorectal malignant neoplasm, This                            is the patient's first colonoscopy Medicines:                Propofol per Anesthesia, Monitored Anesthesia Care Procedure:                Pre-Anesthesia Assessment:                           - Prior to the procedure, a History and Physical                            was performed, and patient medications and                            allergies were reviewed. The patient's tolerance of                            previous anesthesia was also reviewed. The risks                            and benefits of the procedure and the sedation                            options and risks were discussed with the patient.                            All questions were answered, and informed consent                            was obtained. Prior Anticoagulants: The patient has                            taken no previous anticoagulant or antiplatelet                            agents. ASA Grade Assessment: II - A patient with                            mild systemic disease. After reviewing the risks                            and benefits, the patient was deemed in                            satisfactory condition to undergo the procedure.  After obtaining informed consent, the colonoscope                            was passed under direct vision. Throughout the                            procedure, the patient's blood pressure, pulse, and                            oxygen saturations were monitored continuously. The                            Colonoscope was introduced through the anus and                             advanced to the the terminal ileum, with                            identification of the appendiceal orifice and IC                            valve. The colonoscopy was somewhat difficult due                            to significant looping. Successful completion of                            the procedure was aided by applying abdominal                            pressure. The patient tolerated the procedure well.                            The quality of the bowel preparation was good. The                            terminal ileum, ileocecal valve, appendiceal                            orifice, and rectum were photographed. The bowel                            preparation used was Miralax via split dose                            instruction. Scope In: 8:49:33 AM Scope Out: 9:05:28 AM Scope Withdrawal Time: 0 hours 11 minutes 19 seconds  Total Procedure Duration: 0 hours 15 minutes 55 seconds  Findings:                 The perianal and digital rectal examinations were                            normal.  A diminutive polyp was found in the ascending                            colon. The polyp was flat. The polyp was removed                            with a cold snare. Resection and retrieval were                            complete. Verification of patient identification                            for the specimen was done. Estimated blood loss was                            minimal.                           Multiple diverticula were found in the sigmoid                            colon.                           The exam was otherwise without abnormality on                            direct and retroflexion views. Complications:            No immediate complications. Estimated Blood Loss:     Estimated blood loss was minimal. Impression:               - One diminutive polyp in the ascending colon,                            removed with a cold snare.  Resected and retrieved.                           - Diverticulosis in the sigmoid colon.                           - The examination was otherwise normal on direct                            and retroflexion views. Recommendation:           - Patient has a contact number available for                            emergencies. The signs and symptoms of potential                            delayed complications were discussed with the                            patient. Return to normal activities  tomorrow.                            Written discharge instructions were provided to the                            patient.                           - Resume previous diet.                           - Continue present medications.                           - Await pathology results.                           - Repeat colonoscopy is recommended. The                            colonoscopy date will be determined after pathology                            results from today's exam become available for                            review. Gatha Mayer, MD 05/13/2021 9:10:50 AM This report has been signed electronically.

## 2021-05-13 NOTE — Patient Instructions (Addendum)
I found and removed one tiny polyp.   You also have a condition called diverticulosis - common and not usually a problem. Please read the handout provided.  I will let you know pathology results and when to have another routine colonoscopy by mail and/or My Chart.  I appreciate the opportunity to care for you. Gatha Mayer, MD, [facility]   Handouts given for polyps and diverticulosis.  YOU HAD AN ENDOSCOPIC PROCEDURE TODAY AT [facility]:   Refer to the procedure report that was given to you for any specific questions about what was found during the examination.  If the procedure report does not answer your questions, please call your gastroenterologist to clarify.  If you requested that your care partner not be given the details of your procedure findings, then the procedure report has been included in a sealed envelope for you to review at your convenience later.  YOU SHOULD EXPECT: Some feelings of bloating in the abdomen. Passage of more gas than usual.  Walking can help get rid of the air that was put into your GI tract during the procedure and reduce the bloating. If you had a lower endoscopy (such as a colonoscopy or flexible sigmoidoscopy) you may notice spotting of blood in your stool or on the toilet paper. If you underwent a bowel prep for your procedure, you may not have a normal bowel movement for a few days.  Please Note:  You might notice some irritation and congestion in your nose or some drainage.  This is from the oxygen used during your procedure.  There is no need for concern and it should clear up in a day or so.  SYMPTOMS TO REPORT IMMEDIATELY:  Following lower endoscopy (colonoscopy or flexible sigmoidoscopy):  Excessive amounts of blood in the stool  Significant tenderness or worsening of abdominal pains  Swelling of the abdomen that is new, acute  Fever of 100F or higher  For urgent or emergent issues, a gastroenterologist can be reached at any hour  by calling [phone]. Do not use MyChart messaging for urgent concerns.    DIET:  We do recommend a small meal at first, but then you may proceed to your regular diet.  Drink plenty of fluids but you should avoid alcoholic beverages for 24 hours.  ACTIVITY:  You should plan to take it easy for the rest of today and you should NOT DRIVE or use heavy machinery until tomorrow (because of the sedation medicines used during the test).    FOLLOW UP: Our staff will call the number listed on your records 48-72 hours following your procedure to check on you and address any questions or concerns that you may have regarding the information given to you following your procedure. If we do not reach you, we will leave a message.  We will attempt to reach you two times.  During this call, we will ask if you have developed any symptoms of COVID 19. If you develop any symptoms (ie: fever, flu-like symptoms, shortness of breath, cough etc.) before then, please call [phone].  If you test positive for Covid 19 in the 2 weeks post procedure, please call and report this information to [place].    If any biopsies were taken you will be contacted by phone or by letter within the next 1-3 weeks.  Please call us at [phone] if you have not heard about the biopsies in 3 weeks.    SIGNATURES/CONFIDENTIALITY: You and/or your care partner  have signed paperwork which will be entered into your electronic medical record.  These signatures attest to the fact that that the information above on your After Visit Summary has been reviewed and is understood.  Full responsibility of the confidentiality of this discharge information lies with you and/or your care-partner.

## 2021-05-13 NOTE — Progress Notes (Signed)
A/ox3, pleased with MAC, report to RN 

## 2021-05-13 NOTE — Progress Notes (Signed)
Called to room to assist during endoscopic procedure.  Patient ID and intended procedure confirmed with present staff. Received instructions for my participation in the procedure from the performing physician.  

## 2021-05-13 NOTE — Progress Notes (Signed)
Pt's states no medical or surgical changes since previsit or office visit. 

## 2021-05-16 ENCOUNTER — Other Ambulatory Visit: Payer: Self-pay | Admitting: Internal Medicine

## 2021-05-16 NOTE — Telephone Encounter (Signed)
Please refill as per office routine med refill policy (all routine meds refilled for 3 mo or monthly per pt preference up to one year from last visit, then month to month grace period for 3 mo, then further med refills will have to be denied)  

## 2021-05-17 ENCOUNTER — Telehealth: Payer: Self-pay

## 2021-05-17 NOTE — Telephone Encounter (Signed)
LVM

## 2021-05-18 NOTE — Telephone Encounter (Signed)
Levothyroxinc refilled x 1 mo  Please to contact pt - please ot make rov for further refills

## 2021-05-23 ENCOUNTER — Other Ambulatory Visit: Payer: Self-pay | Admitting: Internal Medicine

## 2021-05-23 NOTE — Telephone Encounter (Signed)
Please refill as per office routine med refill policy (all routine meds refilled for 3 mo or monthly per pt preference up to one year from last visit, then month to month grace period for 3 mo, then further med refills will have to be denied)  

## 2021-05-25 ENCOUNTER — Ambulatory Visit: Payer: BC Managed Care – PPO | Admitting: Internal Medicine

## 2021-05-27 ENCOUNTER — Encounter: Payer: Self-pay | Admitting: Internal Medicine

## 2021-05-31 ENCOUNTER — Ambulatory Visit: Payer: BC Managed Care – PPO | Admitting: Internal Medicine

## 2021-05-31 ENCOUNTER — Other Ambulatory Visit: Payer: Self-pay

## 2021-05-31 VITALS — BP 128/82 | HR 94 | Ht 68.0 in | Wt 209.0 lb

## 2021-05-31 DIAGNOSIS — E538 Deficiency of other specified B group vitamins: Secondary | ICD-10-CM

## 2021-05-31 DIAGNOSIS — M23201 Derangement of unspecified lateral meniscus due to old tear or injury, left knee: Secondary | ICD-10-CM

## 2021-05-31 DIAGNOSIS — E039 Hypothyroidism, unspecified: Secondary | ICD-10-CM

## 2021-05-31 DIAGNOSIS — K811 Chronic cholecystitis: Secondary | ICD-10-CM

## 2021-05-31 DIAGNOSIS — E785 Hyperlipidemia, unspecified: Secondary | ICD-10-CM | POA: Diagnosis not present

## 2021-05-31 DIAGNOSIS — Z0001 Encounter for general adult medical examination with abnormal findings: Secondary | ICD-10-CM | POA: Diagnosis not present

## 2021-05-31 DIAGNOSIS — E559 Vitamin D deficiency, unspecified: Secondary | ICD-10-CM

## 2021-05-31 DIAGNOSIS — R739 Hyperglycemia, unspecified: Secondary | ICD-10-CM

## 2021-05-31 MED ORDER — VICTOZA 18 MG/3ML ~~LOC~~ SOPN: 1.8000 mg | PEN_INJECTOR | Freq: Every day | SUBCUTANEOUS | 3 refills | Status: DC

## 2021-05-31 NOTE — Patient Instructions (Signed)
Please take all new medication as prescribed - the victoza  We have discussed the Cardiac CT Score test to measure the calcification level (if any) in your heart arteries.  This test has been ordered in our [place], so please call [facility] directly, as they prefer this, at [phone] to be scheduled.  You will be contacted regarding the referral for: Sports medicine  Please continue all other medications as before, and refills have been done if requested.  Please have the pharmacy call with any other refills you may need.  Please continue your efforts at being more active, low cholesterol diet, and weight control.  You are otherwise up to date with prevention measures today.  Please keep your appointments with your specialists as you may have planned  Please go to the LAB at the blood drawing area for the tests to be done  You will be contacted by phone if any changes need to be made immediately.  Otherwise, you will receive a letter about your results with an explanation, but please check with MyChart first.  Please remember to sign up for MyChart if you have not done so, as this will be important to you in the future with finding out test results, communicating by private email, and scheduling acute appointments online when needed.  Please make an Appointment to return in 6 months, or sooner if needed

## 2021-05-31 NOTE — Progress Notes (Signed)
Chief Complaint:: wellness exam and Follow-up (6 month check. Pt would like to discuss weight medication)         HPI:  Laurie Solomon is a 50 y.o. female here for wellness exam; declines shingirx, o/w up to date with preventive referrals and immunizations                        Also looking to try lose wt, ok to try victoza if ok with insurance as insurance would only approve low dose wegovy.  Denies worsening reflux, abd pain, dysphagia, n/v, bowel change or blood - but has known chronic GB and has yet to f/u with general surgury.  Trying to follow lower chol diet, ok for cardiac Ct score testing.  Also has worsening left knee pain and known left lateral meniscal tear now more painful and swelling, not sure of next step to assess, not sure fi she wants surgury.  Pt denies chest pain, increased sob or doe, wheezing, orthopnea, PND, increased LE swelling, palpitations, dizziness or syncope.   Pt denies polydipsia, polyuria, or new focal neuro s/s.   Pt denies fever, wt loss, night sweats, loss of appetite, or other constitutional symptoms  No other new complaints    Wt Readings from Last 3 Encounters:  05/31/21 209 lb (94.8 kg)  04/30/21 200 lb (90.7 kg)  10/08/20 203 lb (92.1 kg)   BP Readings from Last 3 Encounters:  05/31/21 128/82  05/13/21 126/74  10/08/20 122/86   Immunization History  Administered Date(s) Administered   H1N1 08/07/2008   Hepatitis A, Adult 03/07/2018   Influenza Split 07/25/2011, 08/21/2012   Influenza Whole 08/07/2010   Influenza,inj,Quad PF,6+ Mos 09/09/2013, 07/09/2015, 10/13/2016, 07/30/2018   Moderna Sars-Covid-2 Vaccination 01/23/2020, 02/25/2020   PFIZER(Purple Top)SARS-COV-2 Vaccination 10/21/2020   Td 10/07/2008   Tdap 03/07/2018   There are no preventive care reminders to display for this patient.     Past Medical History:  Diagnosis Date   Allergic rhinitis    Allergy    Arthritis    Asthma    childhood   Chronic insomnia     Headache(784.0)    Hx of adenomatous polyp of colon 05/13/2021   diminutive x 1   Hypothyroidism 07/09/2015   Post traumatic stress disorder    Vitamin D deficiency 07/09/2015   Past Surgical History:  Procedure Laterality Date   COLONOSCOPY W/ POLYPECTOMY  05/13/2021   REFRACTIVE SURGERY      reports that she has never smoked. She has never used smokeless tobacco. She reports current alcohol use of about 2.0 standard drinks of alcohol per week. She reports that she does not use drugs. family history includes Breast cancer in some other family members; Diabetes in her paternal grandfather; Heart disease in her maternal grandfather; Hypertension in her maternal grandfather. No Known Allergies Current Outpatient Medications on File Prior to Visit  Medication Sig Dispense Refill   albuterol (VENTOLIN HFA) 108 (90 Base) MCG/ACT inhaler Inhale 1-2 puffs into the lungs every 6 (six) hours as needed for shortness of breath. 54 g 3   buPROPion (WELLBUTRIN XL) 300 MG 24 hr tablet Take 1 tablet (300 mg total) by mouth daily. 90 tablet 3   clonazePAM (KLONOPIN) 1 MG tablet TAKE 1/2 TABLET BY MOUTH IN THE AM AND 1 TABLET AT BEDTIME 60 tablet 5   levothyroxine (SYNTHROID) 25 MCG tablet TAKE 1 TABLET BY MOUTH DAILY BEFORE BREAKFAST. 30 tablet 0  loratadine (CLARITIN) 10 MG tablet Take 10 mg by mouth daily.     montelukast (SINGULAIR) 10 MG tablet TAKE 1 TABLET BY MOUTH EVERYDAY AT BEDTIME 30 tablet 0   Multiple Vitamin (MULTIVITAMIN) tablet Take 1 tablet by mouth daily.       Probiotic CHEW Chew by mouth.     tretinoin (RETIN-A) 0.1 % cream Apply 1 application topically at bedtime.     Insulin Pen Needle 32G X 4 MM MISC Use to administer the Saxenda once a day (Patient not taking: Reported on 05/13/2021) 30 each 5   meloxicam (MOBIC) 7.5 MG tablet Take 7.5 mg by mouth daily. (Patient not taking: No sig reported)     NONFORMULARY OR COMPOUNDED ITEM Allergy Vaccine 1:10 Given at [facility]  (Patient not taking: No sig reported)     permethrin (ELIMITE) 5 % cream Apply 1 application topically daily. (Patient not taking: No sig reported)     Semaglutide-Weight Management (WEGOVY) 0.5 MG/0.5ML SOAJ Inject 0.5 mg into the skin once a week. (Patient not taking: No sig reported) 2 mL 0   triamcinolone (KENALOG) 0.1 % Apply 1 application topically 2 (two) times daily. 30 g 0   No current facility-administered medications on file prior to visit.        ROS:  All others reviewed and negative.  Objective        PE:  BP 128/82   Pulse 94   Ht '5\' 8"'$  (1.727 m)   Wt 209 lb (94.8 kg)   SpO2 98%   BMI 31.78 kg/m                 Constitutional: Pt appears in NAD               HENT: Head: NCAT.                Right Ear: External ear normal.                 Left Ear: External ear normal.                Eyes: . Pupils are equal, round, and reactive to light. Conjunctivae and EOM are normal               Nose: without d/c or deformity               Neck: Neck supple. Gross normal ROM               Cardiovascular: Normal rate and regular rhythm.                 Pulmonary/Chest: Effort normal and breath sounds without rales or wheezing.                Abd:  Soft, NT, ND, + BS, no organomegaly               Neurological: Pt is alert. At baseline orientation, motor grossly intact               Skin: Skin is warm. No rashes, no other new lesions, LE edema - none; left knee with trace effusion, mild tender over lateral oint line               Psychiatric: Pt behavior is normal without agitation   Micro: none  Cardiac tracings I have personally interpreted today:  none  Pertinent Radiological findings (summarize): none   Lab Results  Component Value Date  WBC 9.3 05/31/2021   HGB 14.5 05/31/2021   HCT 42.8 05/31/2021   PLT 275.0 05/31/2021   GLUCOSE 117 (H) 05/31/2021   CHOL 212 (H) 05/31/2021   TRIG 165.0 (H) 05/31/2021   HDL 53.70 05/31/2021   LDLCALC 126 (H) 05/31/2021   ALT  19 05/31/2021   AST 18 05/31/2021   NA 140 05/31/2021   K 3.7 05/31/2021   CL 104 05/31/2021   CREATININE 0.84 05/31/2021   BUN 12 05/31/2021   CO2 28 05/31/2021   TSH 1.18 05/31/2021   Assessment/Plan:  Laurie Solomon is a 50 y.o. White or Caucasian [1] female with  has a past medical history of Allergic rhinitis, Allergy, Arthritis, Asthma, Chronic insomnia, Headache(784.0), adenomatous polyp of colon (05/13/2021), Hypothyroidism (07/09/2015), Post traumatic stress disorder, and Vitamin D deficiency (07/09/2015).  Encounter for well adult exam with abnormal findings Age and sex appropriate education and counseling updated with regular exercise and diet Referrals for preventative services - none needed Immunizations addressed - declines shingrix Smoking counseling  - none needed Evidence for depression or other mood disorder - none significant Most recent labs reviewed. 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  Cathlean Cower, MD 06/05/2021 8:15 PM [facility]

## 2021-06-01 ENCOUNTER — Encounter: Payer: Self-pay | Admitting: Internal Medicine

## 2021-06-01 DIAGNOSIS — R739 Hyperglycemia, unspecified: Secondary | ICD-10-CM | POA: Insufficient documentation

## 2021-06-01 LAB — CBC WITH DIFFERENTIAL/PLATELET
Basophils Absolute: 0.1 10*3/uL (ref 0.0–0.1)
Basophils Relative: 0.6 % (ref 0.0–3.0)
Eosinophils Absolute: 0.2 10*3/uL (ref 0.0–0.7)
Eosinophils Relative: 1.7 % (ref 0.0–5.0)
HCT: 42.8 % (ref 36.0–46.0)
Hemoglobin: 14.5 g/dL (ref 12.0–15.0)
Lymphocytes Relative: 26.8 % (ref 12.0–46.0)
Lymphs Abs: 2.5 10*3/uL (ref 0.7–4.0)
MCHC: 33.8 g/dL (ref 30.0–36.0)
MCV: 88.2 fl (ref 78.0–100.0)
Monocytes Absolute: 0.5 10*3/uL (ref 0.1–1.0)
Monocytes Relative: 5.5 % (ref 3.0–12.0)
Neutro Abs: 6.1 10*3/uL (ref 1.4–7.7)
Neutrophils Relative %: 65.4 % (ref 43.0–77.0)
Platelets: 275 10*3/uL (ref 150.0–400.0)
RBC: 4.85 Mil/uL (ref 3.87–5.11)
RDW: 12.5 % (ref 11.5–15.5)
WBC: 9.3 10*3/uL (ref 4.0–10.5)

## 2021-06-01 LAB — BASIC METABOLIC PANEL
BUN: 12 mg/dL (ref 6–23)
CO2: 28 mEq/L (ref 19–32)
Calcium: 9.2 mg/dL (ref 8.4–10.5)
Chloride: 104 mEq/L (ref 96–112)
Creatinine, Ser: 0.84 mg/dL (ref 0.40–1.20)
GFR: 81.03 mL/min (ref >60.00)
Glucose, Bld: 117 mg/dL — ABNORMAL HIGH (ref 70–99)
Potassium: 3.7 mEq/L (ref 3.5–5.1)
Sodium: 140 mEq/L (ref 135–145)

## 2021-06-01 LAB — URINALYSIS, ROUTINE W REFLEX MICROSCOPIC
Bilirubin Urine: NEGATIVE
Hgb urine dipstick: NEGATIVE
Ketones, ur: NEGATIVE
Nitrite: NEGATIVE
RBC / HPF: NONE SEEN (ref 0–?)
Specific Gravity, Urine: 1.02 (ref 1.000–1.030)
Total Protein, Urine: NEGATIVE
Urine Glucose: NEGATIVE
Urobilinogen, UA: 0.2 (ref 0.0–1.0)
pH: 6 (ref 5.0–8.0)

## 2021-06-01 LAB — LIPID PANEL
Cholesterol: 212 mg/dL — ABNORMAL HIGH (ref 0–200)
HDL: 53.7 mg/dL (ref >39.00)
LDL Cholesterol: 126 mg/dL — ABNORMAL HIGH (ref 0–99)
NonHDL: 158.53
Total CHOL/HDL Ratio: 4
Triglycerides: 165 mg/dL — ABNORMAL HIGH (ref 0.0–149.0)
VLDL: 33 mg/dL (ref 0.0–40.0)

## 2021-06-01 LAB — VITAMIN D 25 HYDROXY (VIT D DEFICIENCY, FRACTURES): VITD: 36.99 ng/mL (ref 30.00–100.00)

## 2021-06-01 LAB — T4, FREE: Free T4: 0.94 ng/dL (ref 0.60–1.60)

## 2021-06-01 LAB — HEPATIC FUNCTION PANEL
ALT: 19 U/L (ref 0–35)
AST: 18 U/L (ref 0–37)
Albumin: 4.3 g/dL (ref 3.5–5.2)
Alkaline Phosphatase: 111 U/L (ref 39–117)
Bilirubin, Direct: 0 mg/dL (ref 0.0–0.3)
Total Bilirubin: 0.3 mg/dL (ref 0.2–1.2)
Total Protein: 7.5 g/dL (ref 6.0–8.3)

## 2021-06-01 LAB — VITAMIN B12: Vitamin B-12: 405 pg/mL (ref 211–911)

## 2021-06-01 LAB — TSH: TSH: 1.18 u[IU]/mL (ref 0.35–5.50)

## 2021-06-05 DIAGNOSIS — E785 Hyperlipidemia, unspecified: Secondary | ICD-10-CM | POA: Insufficient documentation

## 2021-06-05 NOTE — Assessment & Plan Note (Signed)
Pt plans to call herself for general surgury

## 2021-06-05 NOTE — Assessment & Plan Note (Signed)
Last vitamin D Lab Results  Component Value Date   VD25OH 36.99 05/31/2021   Stable, cont oral replacement

## 2021-06-05 NOTE — Assessment & Plan Note (Signed)
Lab Results  Component Value Date   TSH 1.18 05/31/2021   Stable, pt to continue levothyroxine

## 2021-06-05 NOTE — Assessment & Plan Note (Signed)

## 2021-06-05 NOTE — Assessment & Plan Note (Signed)
Pt referred to sports medicine for further u/s and recommendation for tx

## 2021-06-05 NOTE — Assessment & Plan Note (Signed)
Stable, for f/u a1c with next labs

## 2021-06-05 NOTE — Assessment & Plan Note (Signed)
Lab Results  Component Value Date   LDLCALC 126 (H) 05/31/2021   Uncontrolled, for low chol diet, for cardiac ct score, declines statin for now

## 2021-06-05 NOTE — Assessment & Plan Note (Signed)
Ok for victoza start,  to f/u any worsening symptoms or concerns

## 2021-06-10 ENCOUNTER — Ambulatory Visit: Payer: BC Managed Care – PPO | Admitting: Family Medicine

## 2021-06-10 NOTE — Progress Notes (Deleted)
   I, Peterson Lombard, LAT, ATC acting as a scribe for Lynne Leader, MD.  Subjective:    I'm seeing this patient as a consultation for: Dr. Cathlean Cower. Note will be routed back to referring provider/PCP.  CC: Left knee pain  HPI: Pt is a 50 y/o female c/o L knee pain ongoing since 2020. Pt has a prior dx of a L lateral meniscal tear that has recently become more painful. Pt locates pain to   L knee swelling: Mechanical symptoms: Aggravates: Treatments tried:  Past medical history, Surgical history, Family history, Social history, Allergies, and medications have been entered into the medical record, reviewed. ***  Review of Systems: No new headache, visual changes, nausea, vomiting, diarrhea, constipation, dizziness, abdominal pain, skin rash, fevers, chills, night sweats, weight loss, swollen lymph nodes, body aches, joint swelling, muscle aches, chest pain, shortness of breath, mood changes, visual or auditory hallucinations.   Objective:   There were no vitals filed for this visit. General: Well Developed, well nourished, and in no acute distress.  Neuro/Psych: Alert and oriented x3, extra-ocular muscles intact, able to move all 4 extremities, sensation grossly intact. Skin: Warm and dry, no rashes noted.  Respiratory: Not using accessory muscles, speaking in full sentences, trachea midline.  Cardiovascular: Pulses palpable, no extremity edema. Abdomen: Does not appear distended. MSK: ***  Lab and Radiology Results No results found for this or any previous visit (from the past 72 hour(s)). No results found.  Impression and Recommendations:    Assessment and Plan: 50 y.o. female with ***.  PDMP not reviewed this encounter. No orders of the defined types were placed in this encounter.  No orders of the defined types were placed in this encounter.   Discussed warning signs or symptoms. Please see discharge instructions. Patient expresses understanding.   ***

## 2021-06-17 ENCOUNTER — Other Ambulatory Visit: Payer: Self-pay | Admitting: Internal Medicine

## 2021-06-17 NOTE — Telephone Encounter (Signed)
Please refill as per office routine med refill policy (all routine meds refilled for 3 mo or monthly per pt preference up to one year from last visit, then month to month grace period for 3 mo, then further med refills will have to be denied)  

## 2021-06-21 ENCOUNTER — Encounter: Payer: Self-pay | Admitting: Internal Medicine

## 2021-06-28 ENCOUNTER — Other Ambulatory Visit: Payer: Self-pay | Admitting: Internal Medicine

## 2021-07-13 ENCOUNTER — Other Ambulatory Visit: Payer: Self-pay | Admitting: Internal Medicine

## 2021-07-13 NOTE — Telephone Encounter (Signed)
Please refill as per office routine med refill policy (all routine meds to be refilled for 3 mo or monthly (per pt preference) up to one year from last visit, then month to month grace period for 3 mo, then further med refills will have to be denied) ? ?

## 2021-07-22 ENCOUNTER — Other Ambulatory Visit: Payer: Self-pay

## 2021-07-22 ENCOUNTER — Ambulatory Visit (INDEPENDENT_AMBULATORY_CARE_PROVIDER_SITE_OTHER)
Admission: RE | Admit: 2021-07-22 | Discharge: 2021-07-22 | Disposition: A | Discharge status: Home / Self Care | Payer: Self-pay | Source: Ambulatory Visit | Attending: Internal Medicine | Admitting: Internal Medicine

## 2021-07-22 DIAGNOSIS — E785 Hyperlipidemia, unspecified: Secondary | ICD-10-CM

## 2021-07-23 ENCOUNTER — Encounter: Payer: Self-pay | Admitting: Internal Medicine

## 2021-10-28 ENCOUNTER — Telehealth: Payer: BC Managed Care – PPO | Admitting: Physician Assistant

## 2021-10-28 DIAGNOSIS — B9789 Other viral agents as the cause of diseases classified elsewhere: Secondary | ICD-10-CM

## 2021-10-28 DIAGNOSIS — J019 Acute sinusitis, unspecified: Secondary | ICD-10-CM | POA: Diagnosis not present

## 2021-10-28 MED ORDER — IPRATROPIUM BROMIDE 0.03 % NA SOLN: 2.0000 | Freq: Two times a day (BID) | NASAL | 0 refills | Status: DC

## 2021-10-28 MED ORDER — FLUTICASONE PROPIONATE 50 MCG/ACT NA SUSP: 2.0000 | Freq: Every day | NASAL | 0 refills | Status: DC

## 2021-10-28 NOTE — Progress Notes (Signed)
E-Visit for Sinus Problems  We are sorry that you are not feeling well.  Here is how we plan to help!  Based on what you have shared with me it looks like you have sinusitis.  Sinusitis is inflammation and infection in the sinus cavities of the head.  Based on your presentation I believe you most likely have Acute Viral Sinusitis.This is an infection most likely caused by a virus. There is not specific treatment for viral sinusitis other than to help you with the symptoms until the infection runs its course.  You may use an oral decongestant such as Mucinex D or if you have glaucoma or high blood pressure use plain Mucinex. Saline nasal spray help and can safely be used as often as needed for congestion, I have prescribed: Fluticasone nasal spray two sprays in each nostril once a day and Ipratropium Bromide nasal spray 0.03% 2 sprays in eah nostril 2-3 times a day  Some authorities believe that zinc sprays or the use of Echinacea may shorten the course of your symptoms.  Sinus infections are not as easily transmitted as other respiratory infection, however we still recommend that you avoid close contact with loved ones, especially the very young and elderly.  Remember to wash your hands thoroughly throughout the day as this is the number one way to prevent the spread of infection!  Home Care: Only take medications as instructed by your medical team. Do not take these medications with alcohol. A steam or ultrasonic humidifier can help congestion.  You can place a towel over your head and breathe in the steam from hot water coming from a faucet. Avoid close contacts especially the very young and the elderly. Cover your mouth when you cough or sneeze. Always remember to wash your hands.  Get Help Right Away If: You develop worsening fever or sinus pain. You develop a severe head ache or visual changes. Your symptoms persist after you have completed your treatment plan.  Make sure you Understand  these instructions. Will watch your condition. Will get help right away if you are not doing well or get worse.   Thank you for choosing an e-visit.  Your e-visit answers were reviewed by a board certified advanced clinical practitioner to complete your personal care plan. Depending upon the condition, your plan could have included both over the counter or prescription medications.  Please review your pharmacy choice. Make sure the pharmacy is open so you can pick up prescription now. If there is a problem, you may contact your provider through MyChart messaging and have the prescription routed to another pharmacy.  Your safety is important to us. If you have drug allergies check your prescription carefully.   For the next 24 hours you can use MyChart to ask questions about today's visit, request a non-urgent call back, or ask for a work or school excuse. You will get an email in the next two days asking about your experience. I hope that your e-visit has been valuable and will speed your recovery.  I provided 5 minutes of non face-to-face time during this encounter for chart review and documentation.   

## 2021-11-16 ENCOUNTER — Other Ambulatory Visit: Payer: Self-pay | Admitting: Internal Medicine

## 2021-11-16 MED ORDER — WEGOVY 0.5 MG/0.5ML ~~LOC~~ SOAJ: 0.5000 mg | SUBCUTANEOUS | 0 refills | Status: DC

## 2021-11-22 ENCOUNTER — Ambulatory Visit (INDEPENDENT_AMBULATORY_CARE_PROVIDER_SITE_OTHER): Payer: BC Managed Care – PPO | Admitting: Family Medicine

## 2021-11-22 ENCOUNTER — Encounter: Payer: Self-pay | Admitting: Family Medicine

## 2021-11-22 ENCOUNTER — Other Ambulatory Visit: Payer: Self-pay

## 2021-11-22 ENCOUNTER — Ambulatory Visit (INDEPENDENT_AMBULATORY_CARE_PROVIDER_SITE_OTHER): Payer: BC Managed Care – PPO

## 2021-11-22 ENCOUNTER — Encounter: Payer: Self-pay | Admitting: Internal Medicine

## 2021-11-22 ENCOUNTER — Ambulatory Visit: Payer: Self-pay

## 2021-11-22 VITALS — BP 120/80 | HR 79 | Ht 68.0 in | Wt 206.8 lb

## 2021-11-22 DIAGNOSIS — G8929 Other chronic pain: Secondary | ICD-10-CM | POA: Diagnosis not present

## 2021-11-22 DIAGNOSIS — M25562 Pain in left knee: Secondary | ICD-10-CM | POA: Diagnosis not present

## 2021-11-22 NOTE — Patient Instructions (Addendum)
Nice to meet you today.  You had a L knee injection.  Call or go to the ER if you develop a large red swollen joint with extreme pain or oozing puss. Rene Paci referred you to Physical Therapy.  Their office will call you but please let us know if you haven't heard from them by the end of the week regarding scheduling.  Please get an Xray today before you leave.  Follow-up: 8 weeks or as needed

## 2021-11-22 NOTE — Progress Notes (Signed)
Subjective:    CC: L knee pain  I, Laurie Solomon, LAT, ATC, am serving as scribe for Dr. Lynne Leader.  HPI: Pt is a 51 y/o female presenting w/ c/o chronic L knee pain x 2 years that has been worsening recently.  She locates her pain to her L medial knee.  She has seen Dr. Tonita Cong at [place] for her L knee pain previously.  L knee swelling: yes L knee mechanical symptoms: yes Aggravating factors: twisting/rotation; prolonged standing/walking; L knee flexion end-range AROM Treatments tried:Meloxicam; knee brace; ice; IBU   Pertinent review of Systems: No fevers or chills  Relevant historical information: Hypothyroidism.   Objective:    Vitals:   11/22/21 1401  BP: 120/80  Pulse: 79  SpO2: 99%   General: Well Developed, well nourished, and in no acute distress.   MSK: Left knee mild effusion otherwise normal. Normal motion with mild crepitation. Tender palpation medial joint line. Stable ligamentous exam. Positive medial McMurray's test. Intact strength.   Lab and Radiology Results  Procedure: Real-time Ultrasound Guided Injection of left knee superior lateral patellar space Device: Philips Affiniti 50G Images permanently stored and available for review in PACS Ultrasound evaluation prior to injection reveals relatively normal-appearing medial and lateral menisci without large obvious tear. Verbal informed consent obtained.  Discussed risks and benefits of procedure. Warned about infection bleeding damage to structures skin hypopigmentation and fat atrophy among others. Patient expresses understanding and agreement Time-out conducted.   Noted no overlying erythema, induration, or other signs of local infection.   Skin prepped in a sterile fashion.   Local anesthesia: Topical Ethyl chloride.   With sterile technique and under real time ultrasound guidance: 40 mg of Kenalog and 2 mL of Marcaine injected into knee joint. Fluid seen entering the joint  capsule.   Completed without difficulty   Pain immediately resolved suggesting accurate placement of the medication.   Advised to call if fevers/chills, erythema, induration, drainage, or persistent bleeding.   Images permanently stored and available for review in the ultrasound unit.  Impression: Technically successful ultrasound guided injection.    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  No acute fractures. Await formal radiology review    Impression and Recommendations:    Assessment and Plan: 51 y.o. female with medial knee pain.  This is an acute exacerbation of been a chronic issue.  She was seen last year for this at [facility].  I do not know exactly what happened there but she was given meloxicam and I think was somewhat lost to follow-up. In the interim she has not done as well as I would like.  I think her main issue is she probably has some medial compartment DJD as the main source of her pain.  I would not be surprised if ultimately she has a degenerative medial meniscus tear as well. Plan for steroid injection and trial of physical therapy to work on quad strengthening.  If not improving or if worsening next step should probably be MRI to further understand source of pain.  PDMP not reviewed this encounter. Orders Placed This Encounter  Procedures   [place] LIMITED JOINT SPACE STRUCTURES LOW LEFT(NO LINKED CHARGES)    Order Specific Question:   Reason for Exam (SYMPTOM  OR DIAGNOSIS REQUIRED)    Answer:   L knee pain    Order Specific Question:   Preferred imaging location?    Answer:  [facility]   DG Knee AP/LAT W/[place] Left    Standing Status:   Future    Number of Occurrences:   1    Standing Expiration Date:   12/23/2021    Order Specific Question:   Reason for Exam (SYMPTOM  OR DIAGNOSIS REQUIRED)    Answer:   L knee pain    Order  Specific Question:   Is patient pregnant?    Answer:   No    Order Specific Question:   Preferred imaging location?    Answer:   [facility]   Ambulatory referral to Physical Therapy    Referral Priority:   Routine    Referral Type:   Physical Medicine    Referral Reason:   Specialty Services Required    Requested Specialty:   Physical Therapy    Number of Visits Requested:   1   No orders of the defined types were placed in this encounter.   Discussed warning signs or symptoms. Please see discharge instructions. Patient expresses understanding.   The above documentation has been reviewed and is accurate and complete Lynne Leader, M.D.

## 2021-11-23 NOTE — Progress Notes (Signed)
Left knee x-ray shows medium arthritis.

## 2021-12-13 ENCOUNTER — Ambulatory Visit: Payer: BC Managed Care – PPO | Admitting: Family Medicine

## 2021-12-13 ENCOUNTER — Ambulatory Visit: Payer: BC Managed Care – PPO | Admitting: Physical Therapy

## 2021-12-13 ENCOUNTER — Other Ambulatory Visit: Payer: Self-pay

## 2021-12-13 ENCOUNTER — Ambulatory Visit (INDEPENDENT_AMBULATORY_CARE_PROVIDER_SITE_OTHER): Payer: BC Managed Care – PPO

## 2021-12-13 VITALS — BP 122/78 | HR 81 | Ht 68.0 in | Wt 204.6 lb

## 2021-12-13 DIAGNOSIS — G8929 Other chronic pain: Secondary | ICD-10-CM

## 2021-12-13 DIAGNOSIS — R262 Difficulty in walking, not elsewhere classified: Secondary | ICD-10-CM | POA: Diagnosis not present

## 2021-12-13 DIAGNOSIS — M25562 Pain in left knee: Secondary | ICD-10-CM | POA: Diagnosis not present

## 2021-12-13 DIAGNOSIS — M6281 Muscle weakness (generalized): Secondary | ICD-10-CM

## 2021-12-13 DIAGNOSIS — M79671 Pain in right foot: Secondary | ICD-10-CM
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
## 2021-12-13 NOTE — Patient Instructions (Addendum)
Thank you for coming in today.  ' Let me know if you are not better.   Arch straps.   We can add this to PT if needed.
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
## 2021-12-14 NOTE — Progress Notes (Signed)
Right foot x-ray shows a little bit of arthritis at the big toe joint but otherwise looks okay to radiology.

## 2021-12-23 ENCOUNTER — Encounter: Payer: BC Managed Care – PPO | Admitting: Rehabilitative and Restorative Service Providers"

## 2021-12-29 ENCOUNTER — Encounter: Payer: BC Managed Care – PPO | Admitting: Physical Therapy
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
## 2021-12-30 ENCOUNTER — Encounter: Payer: BC Managed Care – PPO | Admitting: Rehabilitative and Restorative Service Providers"

## 2022-01-06 ENCOUNTER — Encounter: Payer: BC Managed Care – PPO | Admitting: Rehabilitative and Restorative Service Providers"
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
## 2022-01-13 ENCOUNTER — Encounter: Payer: BC Managed Care – PPO | Admitting: Rehabilitative and Restorative Service Providers"

## 2022-01-17 ENCOUNTER — Ambulatory Visit: Payer: BC Managed Care – PPO | Admitting: Family Medicine

## 2022-01-17 NOTE — Progress Notes (Deleted)
? ?  I, Peterson Lombard, LAT, ATC acting as a scribe for Lynne Leader, MD. ? ?Laurie Solomon is a 51 y.o. female who presents to [place] at [facility] today for f/u L knee pain. Pt was last seen by Dr. Georgina Snell on 12/13/21 for her R foot. She has seen Dr. Tonita Cong at [place] for her L knee pain previously. Pt was seen for her L knee on 11/22/21 and was given a L knee steroid injection and was referred to PT to work on quad strengthening, completing 1 visit (and canceling all other scheduled visits). Today, pt reports ? ?Dx imaging: 11/22/21 L knee XR ? ?Pertinent review of systems: *** ? ?Relevant historical information: *** ? ? ?Exam:  ?There were no vitals taken for this visit. ?General: Well Developed, well nourished, and in no acute distress.  ? ?MSK: *** ? ? ? ?Lab and Radiology Results ?No results found for this or any previous visit (from the past 72 hour(s)). ?No results found. ? ? ? ? ?Assessment and Plan: ?51 y.o. female with *** ? ? ?PDMP not reviewed this encounter. ?No orders of the defined types were placed in this encounter. ? ?No orders of the defined types were placed in this encounter. ? ? ? ?Discussed warning signs or symptoms. Please see discharge instructions. Patient expresses understanding. ? ? ?*** ? ?

## 2022-01-19 ENCOUNTER — Encounter: Payer: Self-pay | Admitting: Internal Medicine

## 2022-01-20 ENCOUNTER — Encounter: Payer: BC Managed Care – PPO | Admitting: Rehabilitative and Restorative Service Providers"

## 2022-01-20 MED ORDER — SEMAGLUTIDE-WEIGHT MANAGEMENT 1 MG/0.5ML ~~LOC~~ SOAJ: 1.0000 mg | SUBCUTANEOUS | 0 refills | Status: DC

## 2022-01-20 MED ORDER — SEMAGLUTIDE-WEIGHT MANAGEMENT 1.7 MG/0.75ML ~~LOC~~ SOAJ: 1.7000 mg | SUBCUTANEOUS | 0 refills | Status: AC

## 2022-01-20 MED ORDER — SEMAGLUTIDE-WEIGHT MANAGEMENT 0.5 MG/0.5ML ~~LOC~~ SOAJ: 0.5000 mg | SUBCUTANEOUS | 0 refills | Status: AC

## 2022-01-20 MED ORDER — SEMAGLUTIDE-WEIGHT MANAGEMENT 2.4 MG/0.75ML ~~LOC~~ SOAJ: 2.4000 mg | SUBCUTANEOUS | 5 refills | Status: DC

## 2022-01-20 MED ORDER — SEMAGLUTIDE-WEIGHT MANAGEMENT 0.25 MG/0.5ML ~~LOC~~ SOAJ: 0.2500 mg | SUBCUTANEOUS | 0 refills | Status: AC

## 2022-01-27 ENCOUNTER — Encounter: Payer: BC Managed Care – PPO | Admitting: Rehabilitative and Restorative Service Providers"

## 2022-01-30 ENCOUNTER — Telehealth: Payer: BC Managed Care – PPO | Admitting: Nurse Practitioner

## 2022-02-04 ENCOUNTER — Telehealth: Payer: BC Managed Care – PPO | Admitting: Family Medicine

## 2022-02-04 DIAGNOSIS — J3089 Other allergic rhinitis: Secondary | ICD-10-CM

## 2022-02-04 DIAGNOSIS — J302 Other seasonal allergic rhinitis: Secondary | ICD-10-CM

## 2022-02-04 MED ORDER — PREDNISONE 10 MG (21) PO TBPK: ORAL_TABLET | ORAL | 0 refills | Status: DC

## 2022-02-04 NOTE — Progress Notes (Signed)
E visit for Allergic Rhinitis ?We are sorry that you are not feeling well.  Here is how we plan to help! ? ?Based on what you have shared with me it looks like you have Allergic Rhinitis.  Rhinitis is when a reaction occurs that causes nasal congestion, runny nose, sneezing, and itching.  Most types of rhinitis are caused by an inflammation and are associated with symptoms in the eyes ears or throat. ?There are several types of rhinitis.  The most common are acute rhinitis, which is usually caused by a viral illness, allergic or seasonal rhinitis, and nonallergic or year-round rhinitis.  Nasal allergies occur certain times of the year.  Allergic rhinitis is caused when allergens in the air trigger the release of histamine in the body.  Histamine causes itching, swelling, and fluid to build up in the fragile linings of the nasal passages, sinuses and eyelids.  An itchy nose and clear discharge are common. ? ?I recommend the following over the counter treatments: ?You should take a daily dose of antihistamine ? ?I also would recommend a nasal spray: ?Flonase 2 sprays into each nostril once daily ?Stop Afrin. ? ?You may also benefit from prednisone which will be sent. ? ?HOME CARE: ? ?You can use an over-the-counter saline nasal spray as needed ?Avoid areas where there is heavy dust, mites, or molds ?Stay indoors on windy days during the pollen season ?Keep windows closed in home, at least in bedroom; use air conditioner. ?Use high-efficiency house air filter ?Keep windows closed in car, turn [facility] on re-circulate ?Avoid playing out with dog during pollen season ? ?GET HELP RIGHT AWAY IF: ? ?If your symptoms do not improve within 10 days ?You become short of breath ?You develop yellow or green discharge from your nose for over 3 days ?You have coughing fits ? ?MAKE SURE YOU: ? ?Understand these instructions ?Will watch your condition ?Will get help right away if you are not doing well or get worse ? ?Thank you for  choosing an e-visit. ?Your e-visit answers were reviewed by a board certified advanced clinical practitioner to complete your personal care plan. Depending upon the condition, your plan could have included both over the counter or prescription medications. ?Please review your pharmacy choice. Be sure that the pharmacy you have chosen is open so that you can pick up your prescription now.  If there is a problem you may message your provider in [place] to have the prescription routed to another pharmacy. ?Your safety is important to [place]. If you have drug allergies check your prescription carefully.  ?For the next 24 hours, you can use MyChart to ask questions about today?s visit, request a non-urgent call back, or ask for a work or school excuse from your e-visit provider. ?You will get an email in the next two days asking about your experience. I hope that your e-visit has been valuable and will speed your recovery. ? ? ?I have provided 5 minutes of non face to face time during this encounter for chart review and documentation.   ? ? ?  ?

## 2022-03-09 NOTE — Progress Notes (Signed)
? ? Benito Mccreedy D.Merril Abbe ?[facility] ?[place] ?Phone: [phone] ?  ?Assessment and Plan:   ?  ?1. Chronic pain of left knee ?2. Primary osteoarthritis of left knee ?-Chronic with exacerbation, subsequent visit ?- Recurrence of chronic left knee pain with an event coming up this Sunday, so patient is requesting CSI.  Tolerated well per note below ?- Patient believes she only received about 3 weeks of relief after last CSI on 12/13/2021 ?- Encouraged to restart HEP ?- May take Tylenol as needed for day-to-day pain relief ?-X-ray obtained in clinic.  My interpretation: No acute fracture or dislocation.  Moderately decreased joint space medially, cortical irregularities in medial and lateral compartments, sclerosis present. ? ?Pertinent previous records reviewed include knee x-ray from 11/2021 ?  ?Follow Up: As needed if no improvement or worsening of symptoms.  If patient does not receive >3 months relief from the CSI, would consider HA injection in the future ?  ?Subjective:   ?I, Laurie Solomon, am serving as a Education administrator for [employer] ? ?Chief Complaint: right knee pain  ? ?HPI:  ?11/22/2021 ?Pt is a 51 y/o female presenting w/ c/o chronic L knee pain x 2 years that has been worsening recently.  She locates her pain to her L medial knee.  She has seen Dr. Tonita Cong at [place] for her L knee pain previously. ?  ?L knee swelling: yes ?L knee mechanical symptoms: yes ?Aggravating factors: twisting/rotation; prolonged standing/walking; L knee flexion end-range AROM ?Treatments tried:Meloxicam; knee brace; ice; IBU ? ?03/11/2022 ?Patient states she would like a CSI, has an event coming up on Sunday and will be doing a lot of walking  ? ?Relevant Historical Information: Hypothyroidism ? ?Additional pertinent review of systems negative. ? ? ?Current Outpatient Medications:  ?  albuterol (VENTOLIN HFA) 108 (90 Base) MCG/ACT inhaler, Inhale 1-2 puffs  into the lungs every 6 (six) hours as needed for shortness of breath., Disp: 54 g, Rfl: 3 ?  buPROPion (WELLBUTRIN XL) 300 MG 24 hr tablet, TAKE 1 TABLET BY MOUTH EVERY DAY, Disp: 90 tablet, Rfl: 3 ?  clonazePAM (KLONOPIN) 1 MG tablet, TAKE 1/2 TABLET BY MOUTH IN THE AM AND 1 TABLET AT BEDTIME, Disp: 60 tablet, Rfl: 5 ?  levothyroxine (SYNTHROID) 25 MCG tablet, TAKE 1 TABLET BY MOUTH EVERY DAY BEFORE BREAKFAST, Disp: 90 tablet, Rfl: 3 ?  loratadine (CLARITIN) 10 MG tablet, Take 10 mg by mouth daily., Disp: , Rfl:  ?  montelukast (SINGULAIR) 10 MG tablet, TAKE 1 TABLET BY MOUTH EVERYDAY AT BEDTIME, Disp: 90 tablet, Rfl: 3 ?  Multiple Vitamin (MULTIVITAMIN) tablet, Take 1 tablet by mouth daily.  , Disp: , Rfl:  ?  Probiotic CHEW, Chew by mouth., Disp: , Rfl:  ?  [START ON 04/17/2022] Semaglutide-Weight Management 1.7 MG/0.75ML SOAJ, Inject 1.7 mg into the skin once a week for 28 days., Disp: 3 mL, Rfl: 0 ?  [START ON 05/16/2022] Semaglutide-Weight Management 2.4 MG/0.75ML SOAJ, Inject 2.4 mg into the skin once a week for 28 days., Disp: 3 mL, Rfl: 5 ?  tretinoin (RETIN-A) 0.1 % cream, Apply 1 application topically at bedtime., Disp: , Rfl:  ?  fluticasone (FLONASE) 50 MCG/ACT nasal spray, Place 2 sprays into both nostrils daily., Disp: 16 g, Rfl: 0 ?  ipratropium (ATROVENT) 0.03 % nasal spray, Place 2 sprays into both nostrils every 12 (twelve) hours., Disp: 30 mL, Rfl: 0 ?  liraglutide (VICTOZA) 18 MG/3ML SOPN,  Inject 1.8 mg into the skin daily., Disp: 9 mL, Rfl: 3 ?  predniSONE (STERAPRED UNI-PAK 21 TAB) 10 MG (21) TBPK tablet, 10 mg- 6 day dose pack as directed, Disp: 1 each, Rfl: 0 ?  Semaglutide-Weight Management (WEGOVY) 0.5 MG/0.5ML SOAJ, Inject 0.5 mg into the skin once a week., Disp: 2 mL, Rfl: 0 ?  Semaglutide-Weight Management 0.5 MG/0.5ML SOAJ, Inject 0.5 mg into the skin once a week for 28 days., Disp: 2 mL, Rfl: 0 ?  [START ON 03/19/2022] Semaglutide-Weight Management 1 MG/0.5ML SOAJ, Inject 1 mg into the  skin once a week for 28 days., Disp: 2 mL, Rfl: 0  ? ?Objective:   ?  ?Vitals:  ? 03/11/22 1328  ?BP: 110/70  ?Pulse: 89  ?SpO2: 98%  ?Weight: 204 lb (92.5 kg)  ?Height: '5\' 8"'$  (1.727 m)  ?  ?  ?Body mass index is 31.02 kg/m?.  ?  ?Physical Exam:   ? ?General:  awake, alert oriented, no acute distress nontoxic ?Skin: no suspicious lesions or rashes ?Neuro:sensation intact, no deficits, strength 5/5 with no deficits, no atrophy, normal muscle tone ?Psych: No signs of anxiety, depression or other mood disorder ? ?Knee: ?No swelling ?No deformity ?Neg fluid wave, joint milking ?ROM Flex 110 , Ext 0  ?TTP medial joint line ?NTTP over the quad tendon, medial fem condyle, lat fem condyle, patella, plica, patella tendon, tibial tuberostiy, fibular head, posterior fossa, pes anserine bursa, gerdy's tubercle,   lateral jt line ?Neg anterior and posterior drawer ?Neg lachman ?Neg sag sign ?Negative varus stress ?Negative valgus stress ?Negative McMurray ?  ? ?Gait normal  ? ? ?Electronically signed by:  ?Benito Mccreedy D.Merril Abbe ?[facility] ?1:49 PM 03/11/22 ?

## 2022-03-11 ENCOUNTER — Ambulatory Visit (INDEPENDENT_AMBULATORY_CARE_PROVIDER_SITE_OTHER): Payer: BC Managed Care – PPO

## 2022-03-11 ENCOUNTER — Ambulatory Visit: Payer: BC Managed Care – PPO | Admitting: Sports Medicine

## 2022-03-11 VITALS — BP 110/70 | HR 89 | Ht 68.0 in | Wt 204.0 lb

## 2022-03-11 DIAGNOSIS — G8929 Other chronic pain: Secondary | ICD-10-CM | POA: Diagnosis not present

## 2022-03-11 DIAGNOSIS — M1712 Unilateral primary osteoarthritis, left knee: Secondary | ICD-10-CM

## 2022-03-11 DIAGNOSIS — M25562 Pain in left knee: Secondary | ICD-10-CM | POA: Diagnosis not present

## 2022-03-11 NOTE — Patient Instructions (Addendum)
Good to see you   

## 2022-03-28 ENCOUNTER — Other Ambulatory Visit: Payer: Self-pay | Admitting: Internal Medicine

## 2022-04-01 ENCOUNTER — Encounter: Payer: Self-pay | Admitting: Internal Medicine

## 2022-04-05 MED ORDER — SEMAGLUTIDE-WEIGHT MANAGEMENT 2.4 MG/0.75ML ~~LOC~~ SOAJ: 2.4000 mg | SUBCUTANEOUS | 0 refills | Status: DC

## 2022-04-05 MED ORDER — CLONAZEPAM 1 MG PO TABS: ORAL_TABLET | ORAL | 2 refills | Status: DC

## 2022-04-05 NOTE — Telephone Encounter (Signed)
Klonopin already refilled may 22  Ok for wegovy x 3 mo, but no refills as she is due for yearly ROV soon

## 2022-04-05 NOTE — Addendum Note (Signed)
Addended by: Biagio Borg on: 04/05/2022 02:59 PM   Modules accepted: Orders

## 2022-06-29 ENCOUNTER — Other Ambulatory Visit: Payer: Self-pay | Admitting: Internal Medicine

## 2022-07-13 ENCOUNTER — Encounter: Payer: Self-pay | Admitting: Internal Medicine

## 2022-07-14 ENCOUNTER — Other Ambulatory Visit: Payer: Self-pay | Admitting: Internal Medicine

## 2022-07-14 MED ORDER — TRETINOIN 0.1 % EX CREA: 1.0000 "application " | TOPICAL_CREAM | Freq: Every day | CUTANEOUS | 1 refills | Status: DC

## 2022-07-14 NOTE — Telephone Encounter (Signed)
LOV 05/31/21

## 2022-07-17 ENCOUNTER — Other Ambulatory Visit: Payer: Self-pay | Admitting: Internal Medicine

## 2022-07-17 NOTE — Telephone Encounter (Signed)
temporal arteritisPlease refill as per office routine med refill policy (all routine meds to be refilled for 3 mo or monthly (per pt preference) up to one year from last visit, then month to month grace period for 3 mo, then further med refills will have to be denied)

## 2022-07-20 ENCOUNTER — Telehealth: Payer: Self-pay

## 2022-07-20 NOTE — Telephone Encounter (Signed)
Pt PA for Wegovy 2.'4MG'$ /0.75ML was started and send to plan (Key: [id number])

## 2022-07-22 NOTE — Telephone Encounter (Signed)
Pt Wegovy was approved 07/21/22

## 2022-07-27 ENCOUNTER — Other Ambulatory Visit: Payer: Self-pay | Admitting: Internal Medicine

## 2022-07-27 NOTE — Telephone Encounter (Signed)
Please refill as per office routine med refill policy (all routine meds to be refilled for 3 mo or monthly (per pt preference) up to one year from last visit, then month to month grace period for 3 mo, then further med refills will have to be denied) ? ?

## 2022-07-31 ENCOUNTER — Other Ambulatory Visit: Payer: Self-pay | Admitting: Internal Medicine

## 2022-08-02 ENCOUNTER — Telehealth: Payer: BC Managed Care – PPO | Admitting: Physician Assistant

## 2022-08-02 DIAGNOSIS — H9209 Otalgia, unspecified ear: Secondary | ICD-10-CM

## 2022-08-02 MED ORDER — AMOXICILLIN 500 MG PO TABS: 500.0000 mg | ORAL_TABLET | Freq: Two times a day (BID) | ORAL | 0 refills | Status: DC

## 2022-08-02 NOTE — Progress Notes (Signed)
E Visit for Swimmer's Ear  We are sorry that you are not feeling well. Here is how we plan to help!  Based on what you have shared I have more concern for a middle ear infection (otitis media). As such I have prescribed Amoxicillin 500 mg twice daily for 10 days. Continue your OTC pain relievers to help with pain while antibiotic is getting in your system. An antihistamine like Claritin or Xyzal, and a nasal steroid spray like Flonase or nasacort can also be beneficial.   Your symptoms should improve over the next 3 days and should resolve in about 7 days.  HOME CARE:  Wash your hands frequently.  You can take Acetominophen 650 mg every 4-6 hours as needed for pain.  If pain is severe or moderate, you can apply a heating pad (set on low) or hot water bottle (wrapped in a towel) to outer ear for 20 minutes.  This will also increase drainage. Avoid ear plugs Do not use Q-tips After showers, help the water run out by tilting your head to one side.  GET HELP RIGHT AWAY IF:  Fever is over 102.2 degrees. You develop progressive ear pain or hearing loss. Ear symptoms persist longer than 3 days after treatment.  MAKE SURE YOU:  Understand these instructions. Will watch your condition. Will get help right away if you are not doing well or get worse.    Thank you for choosing an e-visit.  Your e-visit answers were reviewed by a board certified advanced clinical practitioner to complete your personal care plan. Depending upon the condition, your plan could have included both over the counter or prescription medications.  Please review your pharmacy choice. Make sure the pharmacy is open so you can pick up prescription now. If there is a problem, you may contact your provider through [employer] and have the prescription routed to another pharmacy.  Your safety is important to [place]. If you have drug allergies check your prescription carefully.   For the next 24 hours you can use MyChart  to ask questions about today's visit, request a non-urgent call back, or ask for a work or school excuse. You will get an email in the next two days asking about your experience. I hope that your e-visit has been valuable and will speed your recovery.

## 2022-08-02 NOTE — Progress Notes (Signed)
I have spent 5 minutes in review of e-visit questionnaire, review and updating patient chart, medical decision making and response to patient.   Jazon Jipson Cody Andrue Dini, PA-C    

## 2022-08-03 ENCOUNTER — Ambulatory Visit: Payer: BC Managed Care – PPO | Admitting: Family Medicine

## 2022-08-03 ENCOUNTER — Ambulatory Visit: Payer: Self-pay

## 2022-08-03 VITALS — BP 124/82 | HR 76 | Ht 68.0 in | Wt 181.0 lb

## 2022-08-03 DIAGNOSIS — M25562 Pain in left knee: Secondary | ICD-10-CM | POA: Diagnosis not present

## 2022-08-03 DIAGNOSIS — G8929 Other chronic pain: Secondary | ICD-10-CM

## 2022-08-03 NOTE — Patient Instructions (Addendum)
Thank you for coming in today.   You received an injection today. Seek immediate medical attention if the joint becomes red, extremely painful, or is oozing fluid.   Check back as needed 

## 2022-08-03 NOTE — Progress Notes (Unsigned)
I, Peterson Lombard, LAT, ATC acting as a scribe for Lynne Leader, MD.  Laurie Solomon is a 51 y.o. female who presents to [place] at [facility] today for cont'd L knee pain. Pt was last seen by Dr. Glennon Mac on 03/11/22 and was given a L knee steroid injection and advised to re-start HEP and use Tylenol. Today, pt reports L knee pain just returned a few weeks ago. Pt will wear her knee brace if she is going to be on her feet a lot.   Dx imaging: 03/11/22 L knee XR  11/22/21 L knee XR  Pertinent review of systems: No fevers or chills  Relevant historical information: Hyperlipidemia   Exam:  BP 124/82   Pulse 76   Ht '5\' 8"'$  (1.727 m)   Wt 181 lb (82.1 kg)   SpO2 99%   BMI 27.52 kg/m  General: Well Developed, well nourished, and in no acute distress.   MSK: Left knee: Mild effusion normal motion with crepitation.  Tender palpation medial joint line.    Lab and Radiology Results  Procedure: Real-time Ultrasound Guided Injection of left knee superior lateral patellar space Device: Philips Affiniti 50G Images permanently stored and available for review in PACS Verbal informed consent obtained.  Discussed risks and benefits of procedure. Warned about infection, bleeding, hyperglycemia damage to structures among others. Patient expresses understanding and agreement Time-out conducted.   Noted no overlying erythema, induration, or other signs of local infection.   Skin prepped in a sterile fashion.   Local anesthesia: Topical Ethyl chloride.   With sterile technique and under real time ultrasound guidance: 40 mg of Kenalog and 2 mL of Marcaine injected into knee joint. Fluid seen entering the joint capsule.   Completed without difficulty   Pain immediately resolved suggesting accurate placement of the medication.   Advised to call if fevers/chills, erythema, induration, drainage, or persistent bleeding.   Images permanently stored and available for review in the  ultrasound unit.  Impression: Technically successful ultrasound guided injection.    EXAM: LEFT KNEE 3 VIEWS   COMPARISON:  X-ray 11/22/2021.   FINDINGS: Mild spurring of the patellofemoral and lateral compartments. Mild joint space loss and spurring of the medial compartment. No fracture or dislocation. Soft tissues are normal.   IMPRESSION: Mild tricompartmental osteoarthrosis.     Electronically Signed   By: Miachel Roux M.D.   On: 03/12/2022 14:35 I, Lynne Leader, personally (independently) visualized and performed the interpretation of the images attached in this note.      Assessment and Plan: 51 y.o. female with left knee pain thought to be due to medial compartment DJD.  Plan for repeat steroid injection.  Last injection was about 4 months ago.  She is heading towards knee replacement at some point in the next few years likely.  Plan to continue current regimen of steroid injections.  We can work on authorization for hyaluronic acid injection or Zilretta injection if her steroid injections stop lasting longer than 3 months. Recommend topical Voltaren gel.  Consider prescription strength NSAIDs if needed temporarily.  PDMP not reviewed this encounter. Orders Placed This Encounter  Procedures   [place] LIMITED JOINT SPACE STRUCTURES LOW LEFT(NO LINKED CHARGES)    Order Specific Question:   Reason for Exam (SYMPTOM  OR DIAGNOSIS REQUIRED)    Answer:   left knee pain    Order Specific Question:   Preferred imaging location?    Answer:   [place]  No orders of the defined types were placed in this encounter.    Discussed warning signs or symptoms. Please see discharge instructions. Patient expresses understanding.   The above documentation has been reviewed and is accurate and complete Lynne Leader, M.D.

## 2022-08-15 ENCOUNTER — Telehealth: Payer: Self-pay

## 2022-08-15 NOTE — Telephone Encounter (Signed)
PA started  Key: [id number]  Your PA has been faxed to the plan as a paper copy. Please contact the plan directly if you haven't received a determination in a typical timeframe.  You will be notified of the determination electronically and via fax.

## 2022-08-15 NOTE — Telephone Encounter (Signed)
PA started for Tretinoin 0.1% cream Key: [id number]  If [employer] has not responded to your request within 24 hours, contact [place] at [phone].

## 2022-08-25 ENCOUNTER — Other Ambulatory Visit: Payer: Self-pay | Admitting: Internal Medicine

## 2022-08-26 ENCOUNTER — Encounter: Payer: Self-pay | Admitting: Family Medicine

## 2022-08-29 ENCOUNTER — Other Ambulatory Visit: Payer: Self-pay | Admitting: Internal Medicine

## 2022-09-07 NOTE — Telephone Encounter (Signed)
Approval for Tretinoin products from 08/15/2022 through 08/15/2025

## 2022-09-29 ENCOUNTER — Other Ambulatory Visit: Payer: Self-pay | Admitting: Internal Medicine

## 2022-09-29 NOTE — Telephone Encounter (Signed)
Please refill as per office routine med refill policy (all routine meds to be refilled for 3 mo or monthly (per pt preference) up to one year from last visit, then month to month grace period for 3 mo, then further med refills will have to be denied) ? ?

## 2022-10-03 ENCOUNTER — Encounter: Payer: Self-pay | Admitting: Internal Medicine

## 2022-10-03 ENCOUNTER — Other Ambulatory Visit: Payer: Self-pay | Admitting: Internal Medicine

## 2022-10-03 ENCOUNTER — Telehealth: Payer: Self-pay | Admitting: *Deleted

## 2022-10-03 DIAGNOSIS — E785 Hyperlipidemia, unspecified: Secondary | ICD-10-CM

## 2022-10-03 DIAGNOSIS — R739 Hyperglycemia, unspecified: Secondary | ICD-10-CM

## 2022-10-03 DIAGNOSIS — E538 Deficiency of other specified B group vitamins: Secondary | ICD-10-CM

## 2022-10-03 DIAGNOSIS — E039 Hypothyroidism, unspecified: Secondary | ICD-10-CM

## 2022-10-03 DIAGNOSIS — E559 Vitamin D deficiency, unspecified: Secondary | ICD-10-CM

## 2022-10-03 DIAGNOSIS — Z0001 Encounter for general adult medical examination with abnormal findings: Secondary | ICD-10-CM

## 2022-10-03 NOTE — Telephone Encounter (Signed)
L knee Zilretta initiated through portal.

## 2022-10-04 ENCOUNTER — Other Ambulatory Visit: Payer: Self-pay | Admitting: Internal Medicine

## 2022-10-06 MED ORDER — MONTELUKAST SODIUM 10 MG PO TABS: ORAL_TABLET | ORAL | 0 refills | Status: DC

## 2022-10-06 NOTE — Telephone Encounter (Signed)
Scheduled

## 2022-10-06 NOTE — Telephone Encounter (Signed)
L knee Zilretta approved. No pre-cert required.

## 2022-10-07 ENCOUNTER — Other Ambulatory Visit: Payer: Self-pay | Admitting: Internal Medicine

## 2022-10-10 ENCOUNTER — Ambulatory Visit: Payer: BC Managed Care – PPO | Admitting: Family Medicine

## 2022-10-10 ENCOUNTER — Ambulatory Visit: Payer: Self-pay

## 2022-10-10 VITALS — BP 142/84 | HR 72 | Ht 68.0 in | Wt 169.0 lb

## 2022-10-10 DIAGNOSIS — M25562 Pain in left knee: Secondary | ICD-10-CM

## 2022-10-10 DIAGNOSIS — M1712 Unilateral primary osteoarthritis, left knee: Secondary | ICD-10-CM | POA: Diagnosis not present

## 2022-10-10 DIAGNOSIS — G8929 Other chronic pain: Secondary | ICD-10-CM

## 2022-10-10 MED ORDER — TRIAMCINOLONE ACETONIDE 32 MG IX SRER
32.0000 mg | Freq: Once | INTRA_ARTICULAR | Status: AC
  Administered 2022-10-10: 32 mg via INTRA_ARTICULAR

## 2022-10-10 NOTE — Progress Notes (Unsigned)
   I, Peterson Lombard, LAT, ATC acting as a scribe for Lynne Leader, MD.  Laurie Solomon is a 51 y.o. female who presents to [place] at [facility] today for cont'd L knee pain. Pt was last seen by Dr. Georgina Snell on 08/03/22 and was given a L knee steroid injection. Today, pt reports knee pain did not improve w/ prior steroid injection. Pt is wanted to try the Zilretta injection today.  She notes chronic daily knee pain.  Dx imaging: 03/11/22 L knee XR             11/22/21 L knee XR  Pertinent review of systems: No fevers or chills  Relevant historical information: History of a bucket-handle lateral meniscus tear of the left knee.   Exam:  BP (!) 142/84   Pulse 72   Ht '5\' 8"'$  (1.727 m)   Wt 169 lb (76.7 kg)   SpO2 98%   BMI 25.70 kg/m  General: Well Developed, well nourished, and in no acute distress.   MSK: Left knee: Normal-appearing Normal motion with crepitation.  Tender palpation lateral joint line. Slight laxity with LCL stress testing.    Lab and Radiology Results  Zilretta injection left knee Procedure: Real-time Ultrasound Guided Injection of left knee superior lateral patellar space Device: Philips Affiniti 50G Images permanently stored and available for review in PACS Verbal informed consent obtained.  Discussed risks and benefits of procedure. Warned about infection, bleeding, hyperglycemia damage to structures among others. Patient expresses understanding and agreement Time-out conducted.   Noted no overlying erythema, induration, or other signs of local infection.   Skin prepped in a sterile fashion.   Local anesthesia: Topical Ethyl chloride.   With sterile technique and under real time ultrasound guidance: Zilretta 32 mg injected into knee joint. Fluid seen entering the joint capsule.   Completed without difficulty     Advised to call if fevers/chills, erythema, induration, drainage, or persistent bleeding.   Images permanently stored and  available for review in the ultrasound unit.  Impression: Technically successful ultrasound guided injection. Lot number: [id number]    Assessment and Plan: 51 y.o. female with left knee pain due to exacerbation of DJD.  Plan for Zilretta injection.  If this does not work well enough we may want to reevaluate the knee with an MRI.  She does have mild to moderate DJD on x-ray which could explain the degree of pain that she is experiencing but an MRI may be more accurate.   PDMP not reviewed this encounter. Orders Placed This Encounter  Procedures   [place] LIMITED JOINT SPACE STRUCTURES LOW LEFT(NO LINKED CHARGES)    Order Specific Question:   Reason for Exam (SYMPTOM  OR DIAGNOSIS REQUIRED)    Answer:   left knee pain    Order Specific Question:   Preferred imaging location?    Answer:   [place]   Meds ordered this encounter  Medications   Triamcinolone Acetonide (ZILRETTA) intra-articular injection 32 mg     Discussed warning signs or symptoms. Please see discharge instructions. Patient expresses understanding.   The above documentation has been reviewed and is accurate and complete Lynne Leader, M.D.

## 2022-10-10 NOTE — Patient Instructions (Signed)
Thank you for coming in today.   You received an injection today. Seek immediate medical attention if the joint becomes red, extremely painful, or is oozing fluid.   Let me know how this goes.

## 2022-10-13 ENCOUNTER — Ambulatory Visit: Payer: BC Managed Care – PPO | Admitting: Family Medicine

## 2022-10-29 ENCOUNTER — Other Ambulatory Visit: Payer: Self-pay | Admitting: Internal Medicine

## 2022-10-29 NOTE — Telephone Encounter (Signed)
Please refill as per office routine med refill policy (all routine meds to be refilled for 3 mo or monthly (per pt preference) up to one year from last visit, then month to month grace period for 3 mo, then further med refills will have to be denied) ? ?

## 2022-11-02 ENCOUNTER — Telehealth: Payer: Self-pay | Admitting: Internal Medicine

## 2022-11-02 ENCOUNTER — Other Ambulatory Visit: Payer: Self-pay | Admitting: Internal Medicine

## 2022-11-02 NOTE — Telephone Encounter (Signed)
Called pt and made aware that once approved by provider, medication will be send in

## 2022-11-02 NOTE — Telephone Encounter (Signed)
Please refill as per office routine med refill policy (all routine meds to be refilled for 3 mo or monthly (per pt preference) up to one year from last visit, then month to month grace period for 3 mo, then further med refills will have to be denied) ? ?

## 2022-11-02 NOTE — Telephone Encounter (Signed)
Pt is requesting refill before her next appointment is it ok to send it in

## 2022-11-02 NOTE — Telephone Encounter (Signed)
I have been corresponding with PT via MyChart message regarding their upcoming physical. PT was wanting to get labs drawn prior to their appointment and require the proper lab orders be placed.  They are also requesting a refill on their  montelukast (SINGULAIR) 10 MG tablet if possible as they will be out of it before their next appointment comes up.  CB if needed: [phone]

## 2022-11-08 ENCOUNTER — Other Ambulatory Visit: Payer: Self-pay

## 2022-11-08 MED ORDER — MONTELUKAST SODIUM 10 MG PO TABS: ORAL_TABLET | ORAL | 0 refills | Status: DC

## 2022-11-08 NOTE — Addendum Note (Signed)
Addended by: Earnstine Regal on: 11/08/2022 03:51 PM   Modules accepted: Orders

## 2022-11-15 ENCOUNTER — Other Ambulatory Visit (INDEPENDENT_AMBULATORY_CARE_PROVIDER_SITE_OTHER): Payer: BC Managed Care – PPO

## 2022-11-15 ENCOUNTER — Other Ambulatory Visit: Payer: BC Managed Care – PPO

## 2022-11-15 DIAGNOSIS — E559 Vitamin D deficiency, unspecified: Secondary | ICD-10-CM | POA: Diagnosis not present

## 2022-11-15 DIAGNOSIS — E039 Hypothyroidism, unspecified: Secondary | ICD-10-CM | POA: Diagnosis not present

## 2022-11-15 DIAGNOSIS — Z0001 Encounter for general adult medical examination with abnormal findings: Secondary | ICD-10-CM

## 2022-11-15 LAB — CBC WITH DIFFERENTIAL/PLATELET
Basophils Absolute: 0.1 10*3/uL (ref 0.0–0.1)
Basophils Relative: 0.7 % (ref 0.0–3.0)
Eosinophils Absolute: 0.1 10*3/uL (ref 0.0–0.7)
Eosinophils Relative: 1.8 % (ref 0.0–5.0)
HCT: 44.4 % (ref 36.0–46.0)
Hemoglobin: 15.1 g/dL — ABNORMAL HIGH (ref 12.0–15.0)
Lymphocytes Relative: 28 % (ref 12.0–46.0)
Lymphs Abs: 2.2 10*3/uL (ref 0.7–4.0)
MCHC: 33.9 g/dL (ref 30.0–36.0)
MCV: 90.4 fl (ref 78.0–100.0)
Monocytes Absolute: 0.4 10*3/uL (ref 0.1–1.0)
Monocytes Relative: 5.5 % (ref 3.0–12.0)
Neutro Abs: 5 10*3/uL (ref 1.4–7.7)
Neutrophils Relative %: 64 % (ref 43.0–77.0)
Platelets: 291 10*3/uL (ref 150.0–400.0)
RBC: 4.91 Mil/uL (ref 3.87–5.11)
RDW: 12.6 % (ref 11.5–15.5)
WBC: 7.9 10*3/uL (ref 4.0–10.5)

## 2022-11-15 LAB — URINALYSIS, ROUTINE W REFLEX MICROSCOPIC
Bilirubin Urine: NEGATIVE
Hgb urine dipstick: NEGATIVE
Ketones, ur: NEGATIVE
Nitrite: NEGATIVE
RBC / HPF: NONE SEEN (ref 0–?)
Specific Gravity, Urine: 1.01 (ref 1.000–1.030)
Total Protein, Urine: NEGATIVE
Urine Glucose: NEGATIVE
Urobilinogen, UA: 0.2 (ref 0.0–1.0)
pH: 7.5 (ref 5.0–8.0)

## 2022-11-15 LAB — BASIC METABOLIC PANEL
BUN: 9 mg/dL (ref 6–23)
CO2: 30 mEq/L (ref 19–32)
Calcium: 9.3 mg/dL (ref 8.4–10.5)
Chloride: 104 mEq/L (ref 96–112)
Creatinine, Ser: 0.81 mg/dL (ref 0.40–1.20)
GFR: 83.79 mL/min (ref >60.00)
Glucose, Bld: 88 mg/dL (ref 70–99)
Potassium: 4.2 mEq/L (ref 3.5–5.1)
Sodium: 141 mEq/L (ref 135–145)

## 2022-11-15 LAB — LIPID PANEL
Cholesterol: 215 mg/dL — ABNORMAL HIGH (ref 0–200)
HDL: 49.8 mg/dL (ref >39.00)
LDL Cholesterol: 147 mg/dL — ABNORMAL HIGH (ref 0–99)
NonHDL: 165.29
Total CHOL/HDL Ratio: 4
Triglycerides: 93 mg/dL (ref 0.0–149.0)
VLDL: 18.6 mg/dL (ref 0.0–40.0)

## 2022-11-15 LAB — HEPATIC FUNCTION PANEL
ALT: 17 U/L (ref 0–35)
AST: 15 U/L (ref 0–37)
Albumin: 4.3 g/dL (ref 3.5–5.2)
Alkaline Phosphatase: 73 U/L (ref 39–117)
Bilirubin, Direct: 0.1 mg/dL (ref 0.0–0.3)
Total Bilirubin: 0.4 mg/dL (ref 0.2–1.2)
Total Protein: 7.4 g/dL (ref 6.0–8.3)

## 2022-11-15 LAB — TSH: TSH: 1.69 u[IU]/mL (ref 0.35–5.50)

## 2022-11-15 LAB — VITAMIN D 25 HYDROXY (VIT D DEFICIENCY, FRACTURES): VITD: 43.45 ng/mL (ref 30.00–100.00)

## 2022-11-18 ENCOUNTER — Ambulatory Visit (INDEPENDENT_AMBULATORY_CARE_PROVIDER_SITE_OTHER): Payer: BC Managed Care – PPO | Admitting: Internal Medicine

## 2022-11-18 VITALS — BP 118/74 | HR 89 | Temp 97.9°F | Ht 68.0 in | Wt 163.0 lb

## 2022-11-18 DIAGNOSIS — Z0001 Encounter for general adult medical examination with abnormal findings: Secondary | ICD-10-CM | POA: Diagnosis not present

## 2022-11-18 DIAGNOSIS — R739 Hyperglycemia, unspecified: Secondary | ICD-10-CM

## 2022-11-18 DIAGNOSIS — E7800 Pure hypercholesterolemia, unspecified: Secondary | ICD-10-CM | POA: Diagnosis not present

## 2022-11-18 DIAGNOSIS — E559 Vitamin D deficiency, unspecified: Secondary | ICD-10-CM

## 2022-11-18 DIAGNOSIS — E039 Hypothyroidism, unspecified: Secondary | ICD-10-CM | POA: Diagnosis not present

## 2022-11-18 MED ORDER — BUPROPION HCL ER (XL) 300 MG PO TB24: 300.0000 mg | ORAL_TABLET | Freq: Every day | ORAL | 3 refills | Status: DC

## 2022-11-18 MED ORDER — TRETINOIN 0.1 % EX CREA: 1.0000 "application " | TOPICAL_CREAM | Freq: Every day | CUTANEOUS | 1 refills | Status: DC

## 2022-11-18 MED ORDER — CLONAZEPAM 1 MG PO TABS: ORAL_TABLET | ORAL | 2 refills | Status: DC

## 2022-11-18 MED ORDER — ALBUTEROL SULFATE HFA 108 (90 BASE) MCG/ACT IN AERS: 1.0000 | INHALATION_SPRAY | Freq: Four times a day (QID) | RESPIRATORY_TRACT | 3 refills | Status: DC | PRN

## 2022-11-18 MED ORDER — MONTELUKAST SODIUM 10 MG PO TABS: ORAL_TABLET | ORAL | 3 refills | Status: DC

## 2022-11-18 MED ORDER — LEVOTHYROXINE SODIUM 25 MCG PO TABS: ORAL_TABLET | ORAL | 3 refills | Status: DC

## 2022-11-18 MED ORDER — WEGOVY 2.4 MG/0.75ML ~~LOC~~ SOAJ: SUBCUTANEOUS | 11 refills | Status: DC

## 2022-11-18 NOTE — Patient Instructions (Signed)

## 2022-11-18 NOTE — Progress Notes (Unsigned)
Patient ID: Laurie Solomon, female   DOB: Jan 01, 1971, 53 y.o.   MRN: [id number]         Chief Complaint:: wellness exam and hld, obesity, hyperglycemia, low thyroid, low Vti D       HPI:  Laurie Solomon is a 52 y.o. female here for wellness exam; has gyn appt for pap and mammogram later this month; o/w up to date                        Also has lost marked wt 20 lbs with wegovy, Feels overall like a new person. Pt denies chest pain, increased sob or doe, wheezing, orthopnea, PND, increased LE swelling, palpitations, dizziness or syncope.   Pt denies polydipsia, polyuria, or new focal neuro s/s.    Pt denies fever, wt loss, night sweats, loss of appetite, or other constitutional symptoms  Note recent CT cardic score of 1.4 - declines statin.  Brother also with elevated chol likely genetic. Denies hyper or hypo thyroid symptoms such as voice, skin or hair change.     Wt Readings from Last 3 Encounters:  11/18/22 163 lb (73.9 kg)  10/10/22 169 lb (76.7 kg)  08/03/22 181 lb (82.1 kg)   BP Readings from Last 3 Encounters:  11/18/22 118/74  10/10/22 (!) 142/84  08/03/22 124/82   Immunization History  Administered Date(s) Administered   COVID-19, mRNA, vaccine(Comirnaty)12 years and older 08/28/2022   H1N1 08/07/2008   Hepatitis A, Adult 03/07/2018   Influenza Split 07/25/2011, 08/21/2012, 08/28/2022   Influenza Whole 08/07/2010   Influenza,inj,Quad PF,6+ Mos 09/09/2013, 07/09/2015, 10/13/2016, 09/21/2017, 07/30/2018, 08/15/2019   Influenza-Unspecified 09/07/2017, 07/17/2020, 07/21/2021, 08/29/2021   Moderna Sars-Covid-2 Vaccination 01/23/2020, 02/25/2020   PFIZER Comirnaty(Gray Top)Covid-19 Tri-Sucrose Vaccine 04/27/2021   PFIZER(Purple Top)SARS-COV-2 Vaccination 10/21/2020   Pfizer Covid-19 Vaccine Bivalent Booster 32yr & up 08/29/2021   Td 10/07/2008   Tdap 03/07/2018   Typhoid Live 03/06/2018   Zoster Recombinat (Shingrix) 07/11/2022, 08/26/2022, 08/28/2022   There are no preventive  care reminders to display for this patient.     Past Medical History:  Diagnosis Date   Allergic rhinitis    Allergy    Arthritis    Asthma    childhood   Chronic insomnia    Headache(784.0)    Hx of adenomatous polyp of colon 05/13/2021   diminutive x 1   Hypothyroidism 07/09/2015   Post traumatic stress disorder    Vitamin D deficiency 07/09/2015   Past Surgical History:  Procedure Laterality Date   COLONOSCOPY W/ POLYPECTOMY  05/13/2021   REFRACTIVE SURGERY      reports that she has never smoked. She has never used smokeless tobacco. She reports current alcohol use of about 2.0 standard drinks of alcohol per week. She reports that she does not use drugs. family history includes Breast cancer in some other family members; Diabetes in her paternal grandfather; Heart disease in her maternal grandfather; Hypertension in her maternal grandfather. No Known Allergies Current Outpatient Medications on File Prior to Visit  Medication Sig Dispense Refill   loratadine (CLARITIN) 10 MG tablet Take 10 mg by mouth daily.     Multiple Vitamin (MULTIVITAMIN) tablet Take 1 tablet by mouth daily.       Probiotic CHEW Chew by mouth.     No current facility-administered medications on file prior to visit.        ROS:  All others reviewed and negative.  Objective  PE:  BP 118/74 (BP Location: Right Arm, Patient Position: Sitting, Cuff Size: Large)   Pulse 89   Temp 97.9 F (36.6 C) (Oral)   Ht '5\' 8"'$  (1.727 m)   Wt 163 lb (73.9 kg)   SpO2 97%   BMI 24.78 kg/m                 Constitutional: Pt appears in NAD               HENT: Head: NCAT.                Right Ear: External ear normal.                 Left Ear: External ear normal.                Eyes: . Pupils are equal, round, and reactive to light. Conjunctivae and EOM are normal               Nose: without d/c or deformity               Neck: Neck supple. Gross normal ROM               Cardiovascular: Normal rate and  regular rhythm.                 Pulmonary/Chest: Effort normal and breath sounds without rales or wheezing.                Abd:  Soft, NT, ND, + BS, no organomegaly               Neurological: Pt is alert. At baseline orientation, motor grossly intact               Skin: Skin is warm. No rashes, no other new lesions, LE edema - none               Psychiatric: Pt behavior is normal without agitation   Micro: none  Cardiac tracings I have personally interpreted today:  none  Pertinent Radiological findings (summarize): none   Lab Results  Component Value Date   WBC 7.9 11/15/2022   HGB 15.1 (H) 11/15/2022   HCT 44.4 11/15/2022   PLT 291.0 11/15/2022   GLUCOSE 88 11/15/2022   CHOL 215 (H) 11/15/2022   TRIG 93.0 11/15/2022   HDL 49.80 11/15/2022   LDLCALC 147 (H) 11/15/2022   ALT 17 11/15/2022   AST 15 11/15/2022   NA 141 11/15/2022   K 4.2 11/15/2022   CL 104 11/15/2022   CREATININE 0.81 11/15/2022   BUN 9 11/15/2022   CO2 30 11/15/2022   TSH 1.69 11/15/2022   Assessment/Plan:  Laurie Solomon is a 52 y.o. White or Caucasian [1] female with  has a past medical history of Allergic rhinitis, Allergy, Arthritis, Asthma, Chronic insomnia, Headache(784.0), adenomatous polyp of colon (05/13/2021), Hypothyroidism (07/09/2015), Post traumatic stress disorder, and Vitamin D deficiency (07/09/2015).  Encounter for well adult exam with abnormal findings Age and sex appropriate education and counseling updated with regular exercise and diet Referrals for preventative services - has appt with YN for pap and mammgram soon Immunizations addressed - none needed Smoking counseling  - none needed Evidence for depression or other mood disorder - none significant Most recent labs reviewed. I have personally reviewed and have noted: 1) the patient's medical and social history 2) The patient's current medications and supplements 3) The patient's height, weight, and BMI have been  recorded in the  chart   HLD (hyperlipidemia) Lab Results  Component Value Date   LDLCALC 147 (H) 11/15/2022   Uncontrolled, goal ldl < 100, recent CT cardiac score only 1.4, declines statin for now   Hyperglycemia No results found for: "HGBA1C" Stable, pt to continue current medical treatment  -for A1c with lab, cont diet, wt loss   Hypothyroidism Lab Results  Component Value Date   TSH 1.69 11/15/2022   Stable, pt to continue levothyroxine 25 mcg qd  Vitamin D deficiency Last vitamin D Lab Results  Component Value Date   VD25OH 43.45 11/15/2022   Stable, cont oral replacement  Morbid obesity ([place]) Now much improved, to continue wegovy  Followup: Return in about 1 year (around 11/19/2023).  Cathlean Cower, MD 11/19/2022 7:56 AM [facility]

## 2022-11-19 ENCOUNTER — Encounter: Payer: Self-pay | Admitting: Internal Medicine

## 2022-11-19 NOTE — Assessment & Plan Note (Signed)
Lab Results  Component Value Date   TSH 1.69 11/15/2022   Stable, pt to continue levothyroxine 25 mcg qd

## 2022-11-19 NOTE — Assessment & Plan Note (Signed)
No results found for: "HGBA1C" Stable, pt to continue current medical treatment  -for A1c with lab, cont diet, wt loss

## 2022-11-19 NOTE — Assessment & Plan Note (Signed)
Age and sex appropriate education and counseling updated with regular exercise and diet Referrals for preventative services - has appt with YN for pap and mammgram soon Immunizations addressed - none needed Smoking counseling  - none needed Evidence for depression or other mood disorder - none significant Most recent labs reviewed. I have personally reviewed and have noted: 1) the patient's medical and social history 2) The patient's current medications and supplements 3) The patient's height, weight, and BMI have been recorded in the chart

## 2022-11-19 NOTE — Assessment & Plan Note (Signed)
Now much improved, to continue wegovy

## 2022-11-19 NOTE — Assessment & Plan Note (Signed)
Last vitamin D Lab Results  Component Value Date   VD25OH 43.45 11/15/2022   Stable, cont oral replacement

## 2022-11-19 NOTE — Assessment & Plan Note (Signed)
Lab Results  Component Value Date   LDLCALC 147 (H) 11/15/2022   Uncontrolled, goal ldl < 100, recent CT cardiac score only 1.4, declines statin for now

## 2022-12-06 ENCOUNTER — Encounter: Payer: Self-pay | Admitting: Internal Medicine

## 2022-12-06 NOTE — Telephone Encounter (Signed)
Very sorry, I have nothing to offer as I dont normally have anything to do with insurance coverage for any certain med.

## 2022-12-09 ENCOUNTER — Telehealth: Payer: Self-pay | Admitting: Family Medicine

## 2022-12-09 DIAGNOSIS — G8929 Other chronic pain: Secondary | ICD-10-CM

## 2022-12-09 NOTE — Telephone Encounter (Signed)
Pt would like to proceed with ordering MRI L knee as previously discussed.

## 2022-12-12 NOTE — Telephone Encounter (Signed)
Knee MRI ordered

## 2022-12-12 NOTE — Telephone Encounter (Signed)
Called pt and advised that MRI order has been placed and that the imaging facility will reach out regarding scheduling once prior auth has been obtained from pt insurance company.   Pt verbalized understanding.

## 2022-12-23 LAB — HM MAMMOGRAPHY

## 2022-12-26 ENCOUNTER — Other Ambulatory Visit: Payer: Self-pay | Admitting: Internal Medicine

## 2022-12-26 MED ORDER — WEGOVY 2.4 MG/0.75ML ~~LOC~~ SOAJ: SUBCUTANEOUS | 1 refills | Status: DC

## 2022-12-26 NOTE — Addendum Note (Signed)
Addended by: Earnstine Regal on: 12/26/2022 12:07 PM   Modules accepted: Orders

## 2022-12-26 NOTE — Addendum Note (Signed)
Addended by: Earnstine Regal on: 12/26/2022 04:19 PM   Modules accepted: Orders

## 2023-01-07 ENCOUNTER — Ambulatory Visit
Admission: RE | Admit: 2023-01-07 | Discharge: 2023-01-07 | Disposition: A | Discharge status: Home / Self Care | Payer: BC Managed Care – PPO | Source: Ambulatory Visit | Attending: Family Medicine | Admitting: Family Medicine

## 2023-01-07 DIAGNOSIS — G8929 Other chronic pain: Secondary | ICD-10-CM

## 2023-01-09 NOTE — Progress Notes (Signed)
MRI of the knee shows much worse arthritis than with expect you have severe areas of arthritis in the medial compartment and moderate arthritis underneath the kneecap and in the lateral compartment.  Going with the arthritis are some meniscus tears that also are problematic.  Recommend return to clinic to talk about the results in full detail.  Unfortunately I suspect that total knee replacement may be the best surgical option here.

## 2023-01-16 ENCOUNTER — Ambulatory Visit: Payer: BC Managed Care – PPO | Admitting: Family Medicine

## 2023-01-17 ENCOUNTER — Ambulatory Visit: Payer: BC Managed Care – PPO | Admitting: Family Medicine

## 2023-01-19 NOTE — Progress Notes (Signed)
[name], PhD, LAT, ATC acting as a scribe for Lynne Leader, MD.  Laurie Solomon is a 52 y.o. female who presents to [place] at [facility] today for f/u L knee pain and MRI review. Pt was last seen by Dr. Georgina Snell 10/10/22 and was given a L knee Zilretta injection. Pt called the office on 12/09/22 requesting L knee MRI be ordered. Today, pt reports no change in sx since last visit. Would like to discuss results of MRI and treatment options.   Dx imaging: 01/07/23 L knee MRI 03/11/22 L knee XR             11/22/21 L knee XR  Pertinent review of systems: No fevers or chills  Relevant historical information: History of meniscus tear left knee   Exam:  BP 100/68   Pulse 88   Ht 5\' 8"  (1.727 m)   Wt 150 lb 14.4 oz (68.4 kg)   SpO2 98%   BMI 22.94 kg/m  General: Well Developed, well nourished, and in no acute distress.   MSK: Left knee mild effusion normal motion.  Tender palpation medial joint line.    Lab and Radiology Results  EXAM: MRI OF THE LEFT KNEE WITHOUT CONTRAST   TECHNIQUE: Multiplanar, multisequence MR imaging of the knee was performed. No intravenous contrast was administered.   COMPARISON:  Left knee radiographs 03/11/2022   FINDINGS: MENISCI   Medial meniscus: There is linear intermediate proton density signal indicating a likely chronic/degenerative tear within the posterior horn of the medial meniscus extending through the inferior articular surface of the central third of the meniscal triangle near the root (sagittal series 8, images 18 and 19), connecting to degenerative fraying of the free edge of the more medial aspect of the posterior horn (sagittal image 20), and extending as a horizontal tear through the superior articular surface of the central third of the more medial aspect of the posterior horn (sagittal images 21 and 22). There is moderate to high-grade extrusion of the body of the medial meniscus. There is complex  tearing of the posterior 75% of the body of the medial meniscus with tears extending through the superior articular surface (coronal images 14 and 15) and inferior articular surface of the middle and peripheral thirds of the meniscal triangle (coronal images 16 and 17). There is diffuse intermediate proton density signal intrasubstance degeneration within the anterior horn of the medial meniscus.   Lateral meniscus: There is intermediate proton density signal intrasubstance degeneration and mild degenerative fraying of the superior aspect of the anterior horn of the lateral meniscus (sagittal series 8 images 8 and 9).   LIGAMENTS   Cruciates: The ACL and PCL are intact.   Collaterals: The medial collateral ligament is intact. The fibular collateral ligament, biceps femoris tendon, iliotibial band, and popliteus tendon are intact.   CARTILAGE   Patellofemoral: Partial-thickness focal cartilage loss within the junction of the patellar apex and lateral patellar facet with mild subchondral marrow edema. Moderate medial trochlear and mild lateral trochlear cartilage thinning.   Medial: High-grade partial to full-thickness cartilage loss throughout the mid to medial aspect of the weight-bearing medial femoral condyle and medial tibial plateau with high-grade subchondral marrow edema. Large peripheral medial component degenerative osteophytes.   Lateral: Mild-to-moderate lateral compartment cartilage thinning with moderate peripheral degenerative osteophytes.   Joint: Smalljoint effusion. Normal Hoffa's fat pad. No plical thickening.   Popliteal Fossa:  No Baker's cyst.   Extensor Mechanism:  Intact quadriceps tendon and  patellar tendon.   Bones:  No acute fracture or dislocation.   Other: None.   IMPRESSION: 1. Severe medial compartment and mild-to-moderate patellofemoral and lateral compartment cartilage degenerative changes. 2. Complex tearing of the body greater than  posterior horn of the medial meniscus with moderate to high-grade extrusion of the body of the medial meniscus. 3. Mild degenerative fraying of the superior aspect of the anterior horn of the lateral meniscus. 4. Small joint effusion.     Electronically Signed   By: Yvonne Kendall M.D.   On: 01/08/2023 14:19 I, Lynne Leader, personally (independently) visualized and performed the interpretation of the images attached in this note.     Assessment and Plan: 52 y.o. female with left knee pain.  Unfortunately she has much more arthritis on MRI than was indicated on her prior x-ray.  She does have a meniscus tear on MRI but in the setting of severe arthritis I am not optimistic that a meniscus tear would make much of a difference.  She will need a knee replacement before too long.  Fortunately she did get benefit from Zilretta injection October 10, 2022 which lasted until recently.  We can do this shot about every 3 months so that we will be able short-term plan for now.  I think it is reasonable for her to start talking to orthopedic surgery regarding total knee replacement and start planning for the next year or 2.  Will work on authorization now for [employer] with anticipation of doing a repeat Zilretta injection in her left knee in the near future.  Answered questions about her MRI report and images and expected knee replacement timeline. I recommended a few surgeons.  Total encounter time 30 minutes including face-to-face time with the patient and, reviewing past medical record, and charting on the date of service.    Discussed warning signs or symptoms. Please see discharge instructions. Patient expresses understanding.   The above documentation has been reviewed and is accurate and complete Lynne Leader, M.D.

## 2023-01-20 ENCOUNTER — Telehealth: Payer: Self-pay

## 2023-01-20 ENCOUNTER — Ambulatory Visit: Payer: BC Managed Care – PPO | Admitting: Family Medicine

## 2023-01-20 ENCOUNTER — Encounter: Payer: Self-pay | Admitting: Family Medicine

## 2023-01-20 VITALS — BP 100/68 | HR 88 | Ht 68.0 in | Wt 150.9 lb

## 2023-01-20 DIAGNOSIS — M1712 Unilateral primary osteoarthritis, left knee: Secondary | ICD-10-CM

## 2023-01-20 DIAGNOSIS — G8929 Other chronic pain: Secondary | ICD-10-CM | POA: Diagnosis not present

## 2023-01-20 DIAGNOSIS — M25562 Pain in left knee: Secondary | ICD-10-CM | POA: Diagnosis not present

## 2023-01-20 NOTE — Telephone Encounter (Signed)
Check benefits for Zilretta inj LEFt knee OA

## 2023-01-20 NOTE — Patient Instructions (Addendum)
Dr. Georgina Snell recommends Dr. Ninfa Linden or Dr. Marlou Sa at [facility] for consultation for knee replacement (P) [phone].   We will re-verify your insurance benefits for Zilretta coverage and someone will reach out to you to schedule appointment for Zilretta inj

## 2023-01-24 NOTE — Telephone Encounter (Signed)
VOB initiated for Zilretta - LEFT knee OA

## 2023-01-25 NOTE — Telephone Encounter (Signed)
Ready for ordering and scheduling  Zilretta for LEFT knee OA  Copay: $80 Co-insurance: n/a Deductible: does not apply  Prior Auth: NOT required  Last Zilretta inj LEFT knee 10/10/22 Can consider repeat inj on or after 01/03/23

## 2023-01-27 NOTE — Telephone Encounter (Signed)
Left message for patient to call back to schedule an appointment.  °

## 2023-02-09 ENCOUNTER — Other Ambulatory Visit: Payer: Self-pay

## 2023-02-09 ENCOUNTER — Ambulatory Visit: Payer: BC Managed Care – PPO | Admitting: Family Medicine

## 2023-02-09 ENCOUNTER — Encounter: Payer: Self-pay | Admitting: Family Medicine

## 2023-02-09 VITALS — BP 110/78 | HR 79 | Ht 68.0 in | Wt 150.0 lb

## 2023-02-09 DIAGNOSIS — G8929 Other chronic pain: Secondary | ICD-10-CM | POA: Diagnosis not present

## 2023-02-09 DIAGNOSIS — M25562 Pain in left knee: Secondary | ICD-10-CM

## 2023-02-09 DIAGNOSIS — M1712 Unilateral primary osteoarthritis, left knee: Secondary | ICD-10-CM | POA: Diagnosis not present

## 2023-02-09 MED ORDER — TRIAMCINOLONE ACETONIDE 32 MG IX SRER
32.0000 mg | Freq: Once | INTRA_ARTICULAR | Status: AC
  Administered 2023-02-09: 32 mg via INTRA_ARTICULAR

## 2023-02-09 NOTE — Progress Notes (Signed)
   I, Josepha Pigg, CMA acting as a scribe for Lynne Leader, MD.  Laurie Solomon is a 52 y.o. female who presents to [place] at [facility] today for continued left knee pain.  Patient was last seen by Dr. Georgina Snell on 01/20/2023 to review her left knee MRI and was advised we would work to [employer] and to consider orthopedic surgery consultation soon.  Today, patient reports mild increase in intensity of pain since last visit. Would like to proceed with Zilretta inj today.   Dx imaging: 01/07/23 L knee MRI 03/11/22 L knee XR             11/22/21 L knee XR  Pertinent review of systems: No fevers or chills  Relevant historical information: Asthma   Exam:  BP 110/78   Pulse 79   Ht 5\' 8"  (1.727 m)   Wt 150 lb (68 kg)   SpO2 98%   BMI 22.81 kg/m  General: Well Developed, well nourished, and in no acute distress.   MSK: Left knee mild effusion normal motion with crepitations.    Lab and Radiology Results  Zilretta injection left knee Procedure: Real-time Ultrasound Guided Injection of left knee joint superior lateral patellar space Device: Philips Affiniti 50G Images permanently stored and available for review in PACS Verbal informed consent obtained.  Discussed risks and benefits of procedure. Warned about infection, bleeding, hyperglycemia damage to structures among others. Patient expresses understanding and agreement Time-out conducted.   Noted no overlying erythema, induration, or other signs of local infection.   Skin prepped in a sterile fashion.   Local anesthesia: Topical Ethyl chloride.   With sterile technique and under real time ultrasound guidance: Zilretta 32 mg injected into the knee joint. Fluid seen entering the joint capsule.   Completed without difficulty   Advised to call if fevers/chills, erythema, induration, drainage, or persistent bleeding.   Images permanently stored and available for review in the ultrasound unit.  Impression:  Technically successful ultrasound guided injection. Lot number: [id number]      Assessment and Plan: 52 y.o. 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  Unfortunately she is nearing the end of conservative management.  Zilretta is effective but I fear it will not be effective forever. Will proceed with Zilretta for now but she should start thinking about surgical planning for the future including potential knee replacement.   PDMP not reviewed this encounter. Orders Placed This Encounter  Procedures   [place] LIMITED JOINT SPACE STRUCTURES LOW BILAT(NO LINKED CHARGES)    Order Specific Question:   Reason for Exam (SYMPTOM  OR DIAGNOSIS REQUIRED)    Answer:   left knee pain    Order Specific Question:   Preferred imaging location?    Answer:   [facility]   Meds ordered this encounter  Medications   Triamcinolone Acetonide (ZILRETTA) intra-articular injection 32 mg     Discussed warning signs or symptoms. Please see discharge instructions. Patient expresses understanding.   The above documentation has been reviewed and is accurate and complete Lynne Leader, M.D.

## 2023-02-09 NOTE — Patient Instructions (Addendum)
Thank you for coming in today.   You received an injection today. Seek immediate medical attention if the joint becomes red, extremely painful, or is oozing fluid.   Let me know how it goes.  

## 2023-02-09 NOTE — Telephone Encounter (Signed)
Zilretta inj LEFT knee 02/09/23, can consider repeat 05/05/23

## 2023-04-05 NOTE — Telephone Encounter (Signed)
VOB initiated for Zilretta for LEFT knee OA. Can repeat on or after 05/05/23.

## 2023-04-06 NOTE — Telephone Encounter (Signed)
Zilretta for LEFT knee OA  Primary Insurance: [employer] [place] [employer] Co-Pay: $80 Co-Insurance: n/a Deductible: does not apply Prior Auth NOT required  Zilretta inj LEFT knee 02/09/23, can consider repeat 05/05/23

## 2023-04-11 NOTE — Telephone Encounter (Signed)
Not due until 6/28. Pt will let us know how it goes.

## 2023-05-08 IMAGING — DX DG FOOT COMPLETE 3+V*R*
3 series · 3 of 3 positions shown · non-contrast
Comparison: None.

CLINICAL DATA: Right lateral foot pain after injury 9 days ago.

EXAM:
RIGHT FOOT COMPLETE - 3+ VIEW

[foot ap]
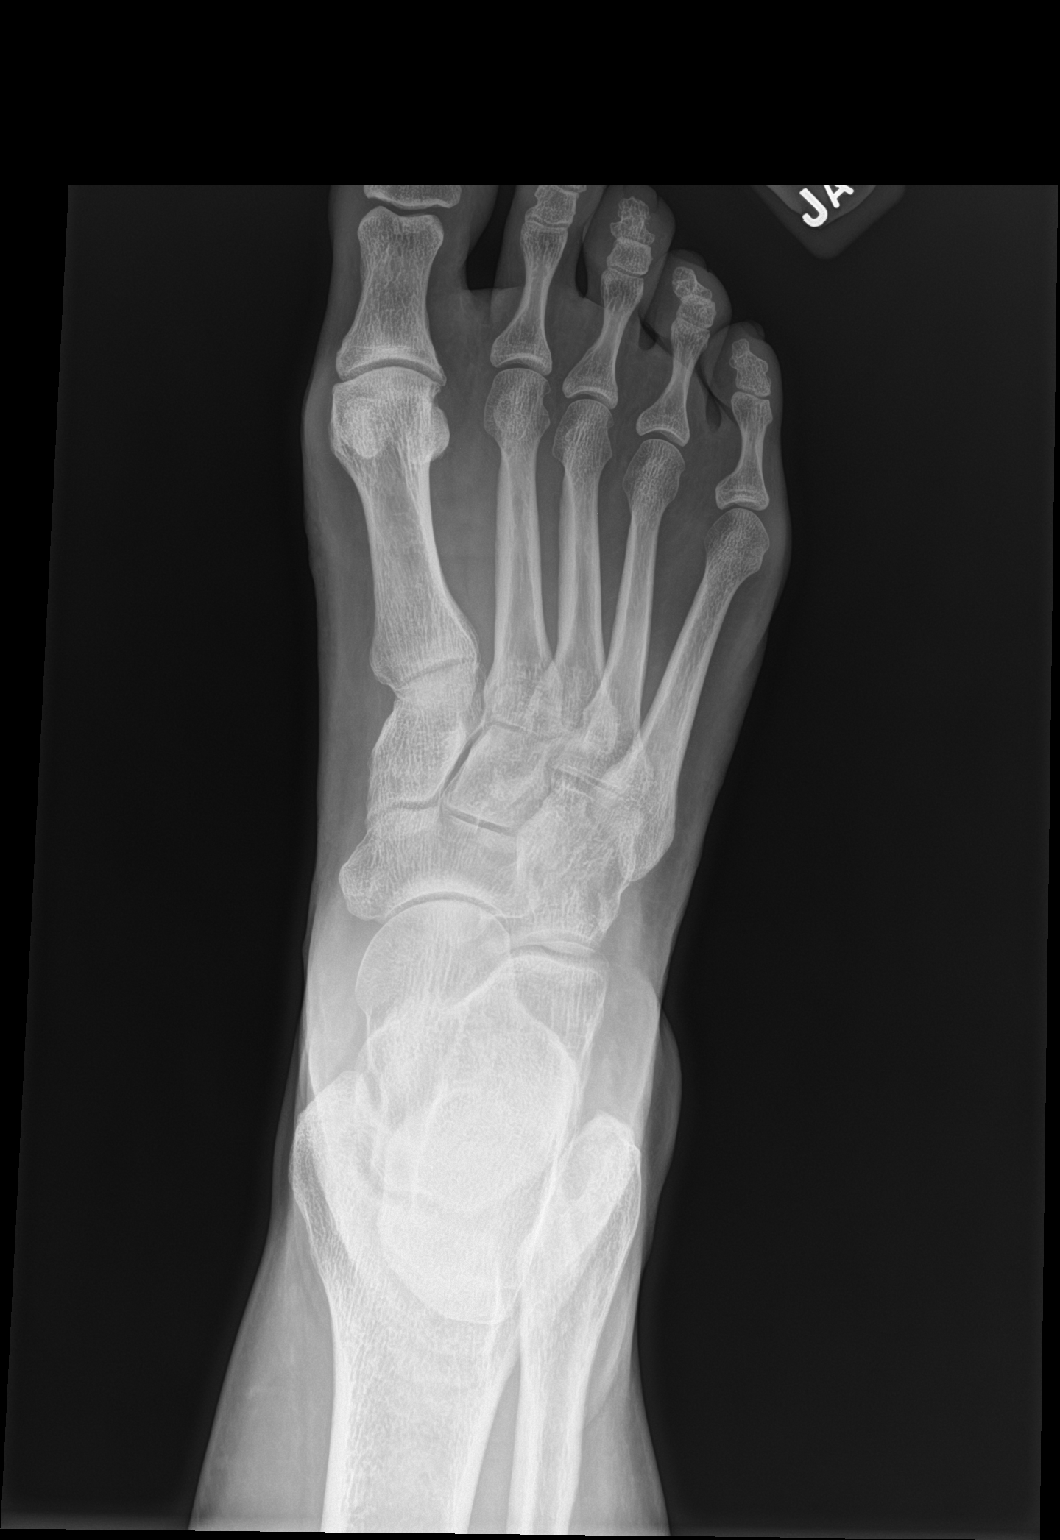

[foot obl]
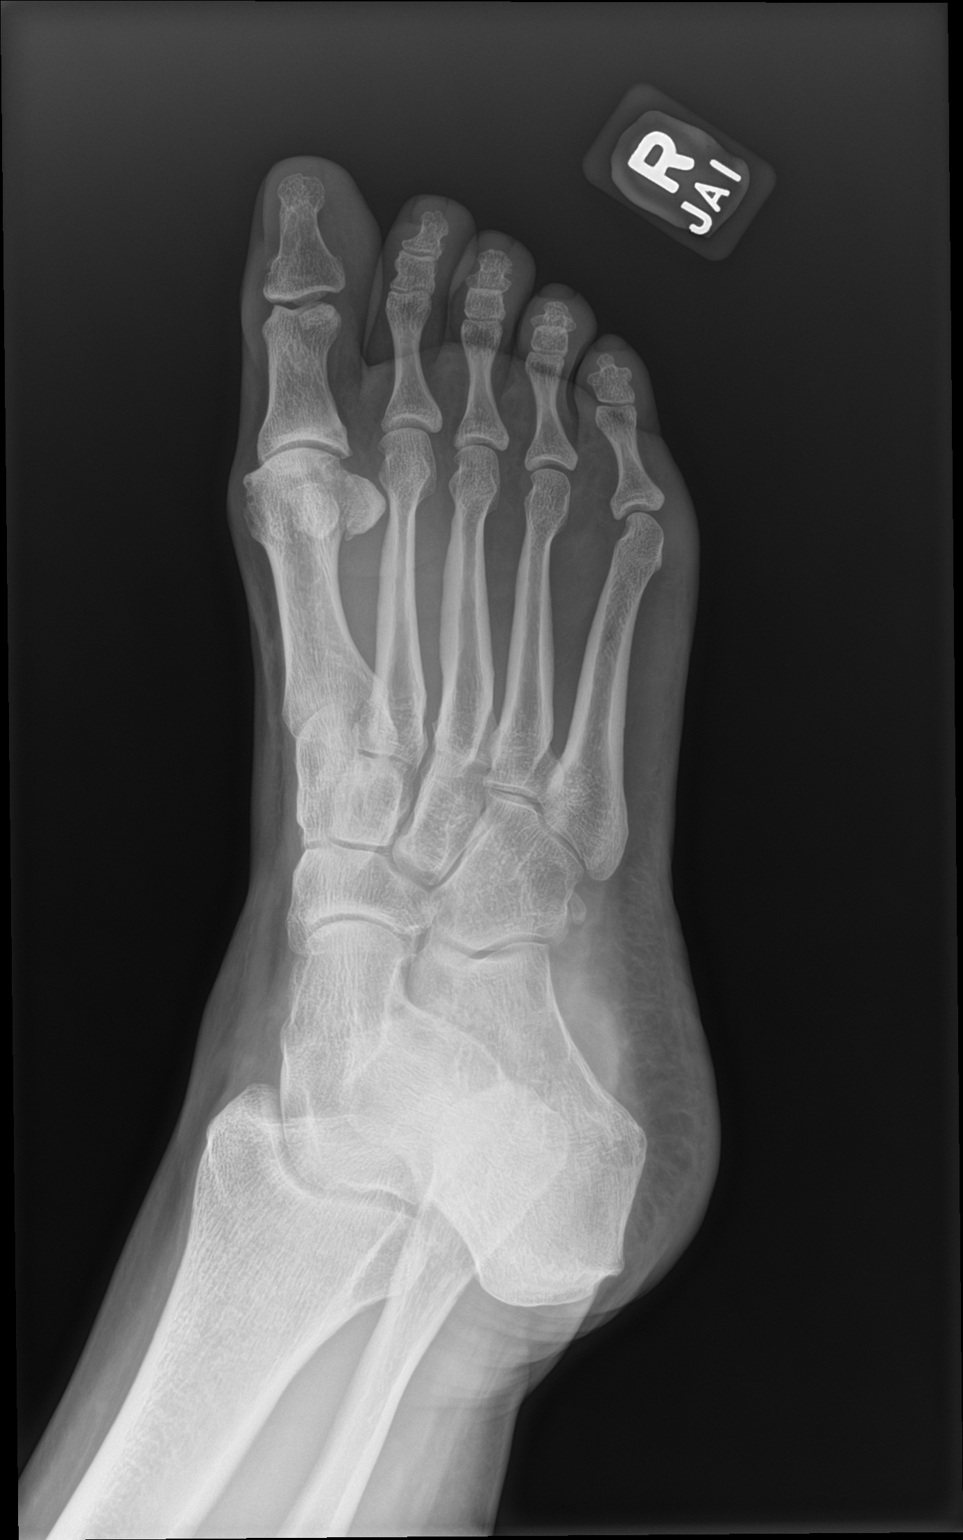

[foot lat]
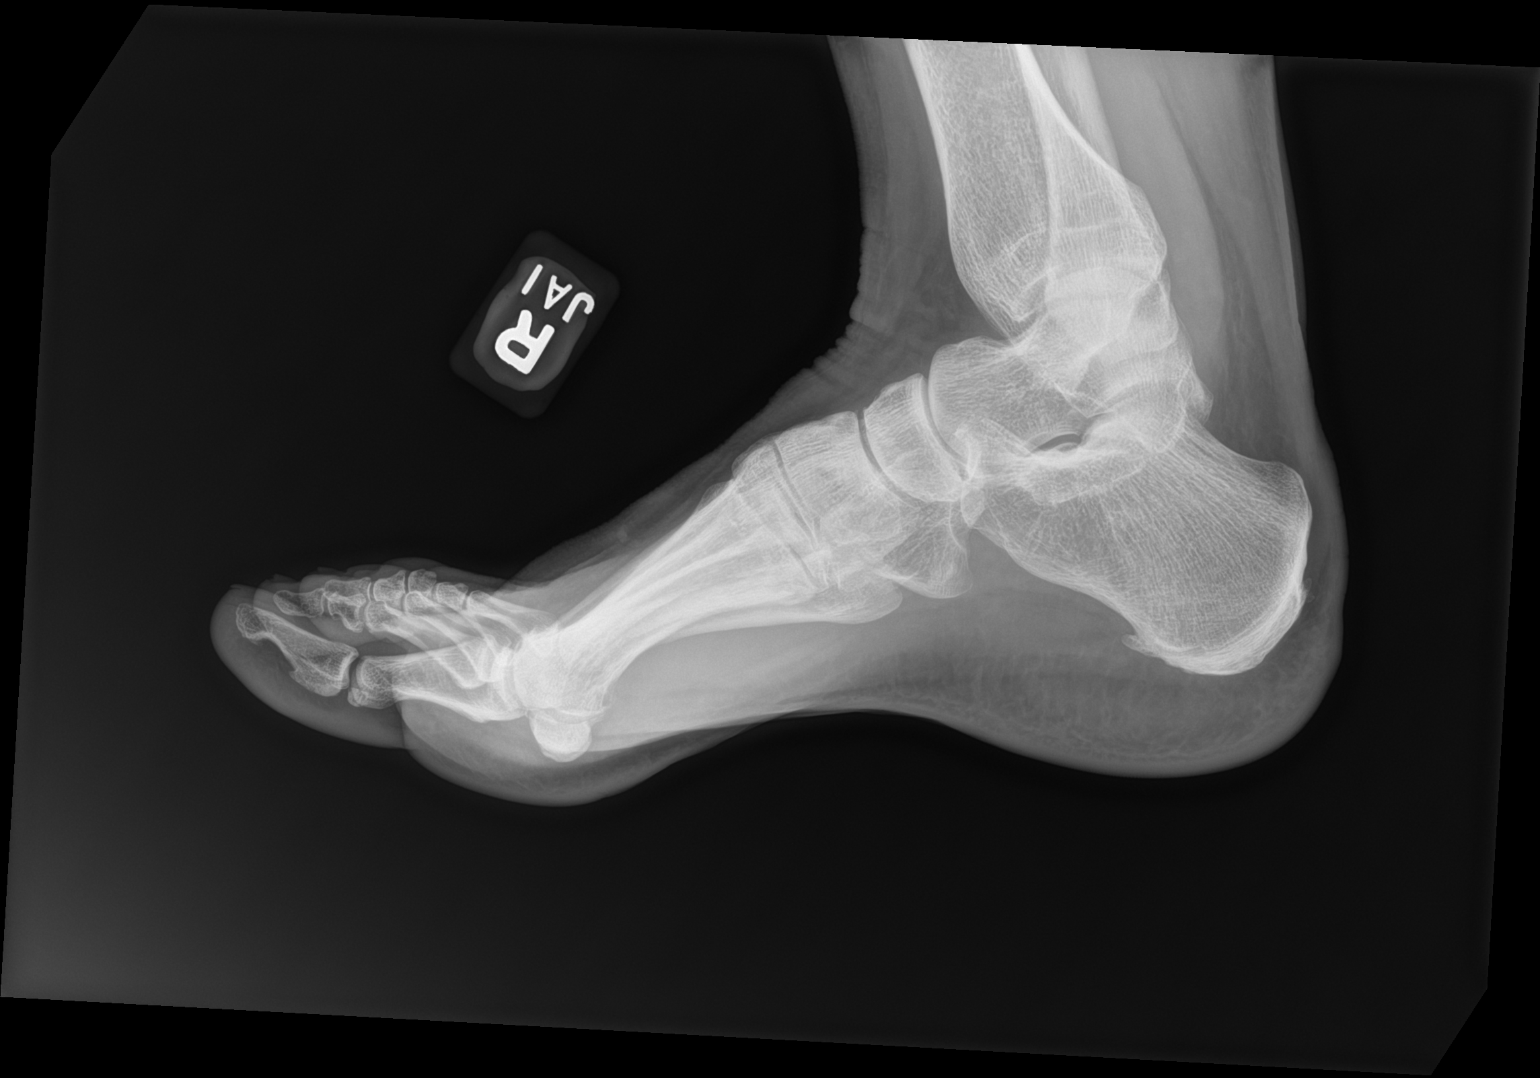

[3 of 3 positions shown; findings below may reference images not displayed]

FINDINGS: There is no acute fracture or dislocation. The bones are well
mineralized. Mild degenerative changes of the first MTP joint. The
soft tissues are unremarkable.
IMPRESSION: No acute fracture or dislocation.

## 2023-05-19 ENCOUNTER — Ambulatory Visit: Payer: BC Managed Care – PPO | Admitting: Family Medicine

## 2023-05-19 ENCOUNTER — Other Ambulatory Visit: Payer: Self-pay

## 2023-05-19 VITALS — BP 138/86 | HR 76 | Ht 68.0 in | Wt 156.0 lb

## 2023-05-19 DIAGNOSIS — G8929 Other chronic pain: Secondary | ICD-10-CM

## 2023-05-19 DIAGNOSIS — N951 Menopausal and female climacteric states: Secondary | ICD-10-CM

## 2023-05-19 DIAGNOSIS — M25562 Pain in left knee: Secondary | ICD-10-CM

## 2023-05-19 DIAGNOSIS — M1712 Unilateral primary osteoarthritis, left knee: Secondary | ICD-10-CM

## 2023-05-19 MED ORDER — TRIAMCINOLONE ACETONIDE 32 MG IX SRER
32.0000 mg | Freq: Once | INTRA_ARTICULAR | Status: AC
  Administered 2023-05-19: 32 mg via INTRA_ARTICULAR

## 2023-05-19 NOTE — Progress Notes (Signed)
Laurie Payor, PhD, LAT, ATC acting as a scribe for Clementeen Graham, MD.  Laurie Solomon is a 52 y.o. female who presents to [facility] today for continued left knee pain. Patient was last seen by Dr. Denyse Amass on 02/09/23 and was given a L knee Zilretta injection and was advised to begin thinking about surgical planning for the future.  Today, pt reports L knee pain returned about 1 wk ago. She is considering surgery and "has a call in" to Dr. Eliberto Ivory office to discuss and plan for knee replacement.  Dx imaging: 01/07/23 L knee MRI 03/11/22 L knee XR             11/22/21 L knee XR  Pertinent review of systems: No fevers or chills  Relevant historical information: Otherwise healthy   Exam:  BP 138/86   Pulse 76   Ht 5\' 8"  (1.727 m)   Wt 156 lb (70.8 kg)   BMI 23.72 kg/m  General: Well Developed, well nourished, and in no acute distress.   MSK: Left knee mild effusion otherwise normal. Normal motion with crepitation.    Lab and Radiology Results   Zilretta injection left knee Procedure: Real-time Ultrasound Guided Injection of left knee joint superior lateral patellar space Device: Philips Affiniti 50G Images permanently stored and available for review in PACS Verbal informed consent obtained.  Discussed risks and benefits of procedure. Warned about infection, hyperglycemia bleeding, damage to structures among others. Patient expresses understanding and agreement Time-out conducted.   Noted no overlying erythema, induration, or other signs of local infection.   Skin prepped in a sterile fashion.   Local anesthesia: Topical Ethyl chloride.   With sterile technique and under real time ultrasound guidance: Zilretta 32 mg injected into knee joint. Fluid seen entering the joint capsule.   Completed without difficulty   Advised to call if fevers/chills, erythema, induration, drainage, or persistent bleeding.   Images permanently stored and available  for review in the ultrasound unit.  Impression: Technically successful ultrasound guided injection.  Lot number: [id number]       Assessment and Plan: 52 y.o. female with left knee pain due to DJD.  Plan for repeat Zilretta injection today.  Last injection was about 3 months ago.  She is considering  contact an orthopedic surgery to talk about her surgical options.  I think this is a good idea.  She should see her surgeon soon-.  Soonest time that she will have a knee replacement would be about 3 months from now in mid October.  This works with her timeline.  She is postmenopausal.  Will check bone density prior to knee replacement.  Will order DEXA scan.  Discussed general screening guidelines as well as reasons to check bone density for her.  If bone density is low we could potentially start anabolic agents prior to knee replacement.  [place] KitchenPDMP not reviewed this encounter. Orders Placed This Encounter  Procedures   [place] LIMITED JOINT SPACE STRUCTURES LOW LEFT(NO LINKED CHARGES)    Order Specific Question:   Reason for Exam (SYMPTOM  OR DIAGNOSIS REQUIRED)    Answer:   left knee pain    Order Specific Question:   Preferred imaging location?    Answer:   [facility] Bone Density    Standing Status:   Future    Standing Expiration Date:   05/18/2024    Order Specific Question:   Reason for Exam (SYMPTOM  OR  DIAGNOSIS REQUIRED)    Answer:   evaluate bone density    Order Specific Question:   Is the patient pregnant?    Answer:   No    Order Specific Question:   Preferred imaging location?    Answer:   [facility]   Meds ordered this encounter  Medications   Triamcinolone Acetonide (ZILRETTA) intra-articular injection 32 mg     Discussed warning signs or symptoms. Please see discharge instructions. Patient expresses understanding.   The above documentation has been reviewed and is accurate and complete Clementeen Graham, M.D.

## 2023-05-19 NOTE — Patient Instructions (Signed)
Thank you for coming in today.   You received an injection today. Seek immediate medical attention if the joint becomes red, extremely painful, or is oozing fluid.   I've ordered a DEXA scan to check on your bone density. You should hear about scheduling soon. Let me know if you don't.   Please give Dr. Eliberto Ivory office a call to schedule a consultation  Check back with me as needed

## 2023-05-23 NOTE — Telephone Encounter (Signed)
Pt received Zilretta inj for LEFT knee OA on 05/19/23.  Can consider repeat on or after 08/12/23.

## 2023-05-31 ENCOUNTER — Encounter: Payer: Self-pay | Admitting: Internal Medicine

## 2023-06-01 ENCOUNTER — Encounter: Payer: Self-pay | Admitting: Family Medicine

## 2023-06-01 MED ORDER — MINOXIDIL 2.5 MG PO TABS: 2.5000 mg | ORAL_TABLET | Freq: Every day | ORAL | 1 refills | Status: DC

## 2023-06-02 MED ORDER — CLONAZEPAM 1 MG PO TABS: ORAL_TABLET | ORAL | 2 refills | Status: DC

## 2023-06-02 MED ORDER — TRETINOIN 0.1 % EX CREA: 1.0000 "application " | TOPICAL_CREAM | Freq: Every day | CUTANEOUS | 1 refills | Status: AC

## 2023-06-02 NOTE — Addendum Note (Signed)
Addended by: Corwin Levins on: 06/02/2023 12:02 PM   Modules accepted: Orders

## 2023-06-02 NOTE — Telephone Encounter (Signed)
Left message for patient to call back to schedule Bone Density.

## 2023-06-02 NOTE — Telephone Encounter (Signed)
Will you reach out to pt to assist with scheduling DEXA?  Thanks!

## 2023-06-07 ENCOUNTER — Ambulatory Visit: Payer: BC Managed Care – PPO | Admitting: Orthopaedic Surgery

## 2023-06-07 DIAGNOSIS — G8929 Other chronic pain: Secondary | ICD-10-CM

## 2023-06-07 DIAGNOSIS — M1712 Unilateral primary osteoarthritis, left knee: Secondary | ICD-10-CM | POA: Diagnosis not present

## 2023-06-07 DIAGNOSIS — M25562 Pain in left knee: Secondary | ICD-10-CM | POA: Diagnosis not present

## 2023-06-07 NOTE — Progress Notes (Signed)
The patient is a very active and pleasant 52 year old female who comes in for evaluation treatment of known arthritis of her left knee.  She is followed regularly by Dr. Clementeen Graham who has placed injections in her left knee that have been quite helpful for her.  She also wears a knee sleeve.  She takes anti-inflammatories only on occasion.  She says that significant weight loss is also helped take pressure off of her left knee.  It does not hurt on a daily basis but she does feel at times it is detrimentally affecting her mobility and her quality of life.  She does things like avoiding walking her dog and she lets her son do this in order to not have her knee hurt.  She says it stays sore after long walks and shopping but she does not know if she is at the point where she would consider knee replacement as of yet.  She does not have significant active medical issues.  I was able to review her medications and past medical history within epic.  She does have family members that have had joint replacement surgery.  Her husband is with her and also has had the same.  She is here to discuss her left knee in detail today in terms of knee replacement surgery and when to potentially pursue that type of surgery.  She did have an injection 2 weeks ago in her left knee.  She does feel like the knee pain is getting slowly worse with time.  Examination of her left knee today shows no effusion.  There is only slight varus malalignment of the knee but is only slight.  She has excellent range of motion of the knee.  It is ligamentously stable.  There is patellofemoral crepitation and some global tenderness in general.  3 views of the left knee does show tricompartment arthritis of the knee with osteophytes that are small in all 3 compartments.  There is some narrowing of the medial joint space in the patellofemoral joint.  I did show her knee replacement model and we talked in length in detail about what the surgery involves.   She understands that she does not need to rush into this and should really do the surgery when she is at the point where it is a daily issue with her and detrimentally affecting her mobility, her quality of life and her actives of daily living.  She may not be there just yet.  She is going to think about this.  I did give her our surgery scheduler's card and I am happy to discuss this in length with her at any time when she gets to the point where she would like to consider the surgery.  Based on her x-rays, I would certainly be in favor of a total knee arthroplasty.  She has had an MRI that also reviewed showing significant cartilage loss throughout the knee.  All questions and concerns were answered and addressed.

## 2023-06-15 ENCOUNTER — Inpatient Hospital Stay: Admission: RE | Admit: 2023-06-15 | Payer: BC Managed Care – PPO | Source: Ambulatory Visit

## 2023-06-22 ENCOUNTER — Ambulatory Visit (INDEPENDENT_AMBULATORY_CARE_PROVIDER_SITE_OTHER)
Admission: RE | Admit: 2023-06-22 | Discharge: 2023-06-22 | Disposition: A | Discharge status: Home / Self Care | Payer: BC Managed Care – PPO | Source: Ambulatory Visit | Attending: Family Medicine | Admitting: Family Medicine

## 2023-06-22 DIAGNOSIS — N951 Menopausal and female climacteric states: Secondary | ICD-10-CM

## 2023-06-22 DIAGNOSIS — M1712 Unilateral primary osteoarthritis, left knee: Secondary | ICD-10-CM | POA: Diagnosis not present

## 2023-06-26 NOTE — Progress Notes (Signed)
Bone density test looks okay.  Continue weightbearing exercise vitamin D and calcium.  No evidence of osteoporosis.

## 2023-06-27 ENCOUNTER — Telehealth: Payer: BC Managed Care – PPO | Admitting: Emergency Medicine

## 2023-06-27 ENCOUNTER — Encounter: Payer: Self-pay | Admitting: Internal Medicine

## 2023-06-27 DIAGNOSIS — J069 Acute upper respiratory infection, unspecified: Secondary | ICD-10-CM

## 2023-06-27 NOTE — Progress Notes (Signed)
E-Visit for Upper Respiratory Infection   We are sorry you are not feeling well.  Here is how we plan to help!  First, I strongly recommend you get tested for COVID. Levels of infection are very high right now and many people with symptoms like yours have COVID.   Based on what you have shared with me, it looks like you may have a viral upper respiratory infection.  Upper respiratory infections are caused by a large number of viruses; however, rhinovirus is the most common cause. COVID is another possibility.   Symptoms vary from person to person, with common symptoms including sore throat, cough, fatigue or lack of energy and feeling of general discomfort.  A low-grade fever of up to 100.4 may present, but is often uncommon.  Symptoms vary however, and are closely related to a person's age or underlying illnesses.  The most common symptoms associated with an upper respiratory infection are nasal discharge or congestion, cough, sneezing, headache and pressure in the ears and face.  These symptoms usually persist for about 3 to 10 days, but can last up to 2 weeks.  It is important to know that upper respiratory infections do not cause serious illness or complications in most cases.    While upper respiratory infections can turn into bacterial sinus infections, antibiotics are not recommended by the Infectious Disease Society of [place] unless you have severe symptoms (including high fever) or you have symptoms for more than 10 days. If you still have symptoms after 10 days, antibiotics should be considered.    Upper respiratory infections can be transmitted from person to person, with the most common method of transmission being a person's hands.  The virus is able to live on the skin and can infect other persons for up to 2 hours after direct contact.  Also, these can be transmitted when someone coughs or sneezes; thus, it is important to cover the mouth to reduce this risk.  To keep the spread of the  illness at bay, good hand hygiene is very important.  This is an infection that is most likely caused by a virus. There are no specific treatments other than to help you with the symptoms until the infection runs its course.  We are sorry you are not feeling well.  Here is how we plan to help!   For nasal congestion, you may use an oral decongestants such as Mucinex D or if you have glaucoma or high blood pressure use plain Mucinex.  Saline nasal spray or nasal drops can help and can safely be used as often as needed for congestion.  Keep using Mucinex.   Saline spray or irrigation can also help with congestion and pressure from congestion. I highly recommend you try using saline irrigation, such as with a neti pot, several times a day while you are sick. Many neti pots come with salt packets premeasured to use to make saline. If you use your own salt, make sure it is kosher salt or sea salt (don't use table salt as it has iodine in it and you don't need that in your nose). Use distilled water to make saline. If you mix your own saline using your own salt, the recipe is 1/4 teaspoon salt in 1 cup warm water. Using saline irrigation can help prevent and treat sinus infections.   If you do not have a history of heart disease, hypertension, diabetes or thyroid disease, prostate/bladder issues or glaucoma, you may also use Sudafed to treat  nasal congestion.  It is highly recommended that you consult with a pharmacist or your primary care physician to ensure this medication is safe for you to take.     If you have a sore or scratchy throat, use a saltwater gargle-  to  teaspoon of salt dissolved in a 4-ounce to 8-ounce glass of warm water.  Gargle the solution for approximately 15-30 seconds and then spit.  It is important not to swallow the solution.  You can also use throat lozenges/cough drops and Chloraseptic spray to help with throat pain or discomfort.  Warm or cold liquids can also be helpful in  relieving throat pain.  For headache, pain or general discomfort, you can use Ibuprofen or Tylenol as directed.   Some authorities believe that zinc sprays or the use of Echinacea may shorten the course of your symptoms.   HOME CARE Only take medications as instructed by your medical team. Be sure to drink plenty of fluids. Water is fine as well as fruit juices, sodas and electrolyte beverages. You may want to stay away from caffeine or alcohol. If you are nauseated, try taking small sips of liquids. How do you know if you are getting enough fluid? Your urine should be a pale yellow or almost colorless. Get rest. Taking a steamy shower or using a humidifier may help nasal congestion and ease sore throat pain. You can place a towel over your head and breathe in the steam from hot water coming from a faucet. Using a saline nasal spray works much the same way. Cough drops, hard candies and sore throat lozenges may ease your cough. Avoid close contacts especially the very young and the elderly Cover your mouth if you cough or sneeze Always remember to wash your hands.   GET HELP RIGHT AWAY IF: You develop worsening fever. If your symptoms do not improve within 10 days You develop yellow or green discharge from your nose over 3 days. You have coughing fits You develop a severe head ache or visual changes. You develop shortness of breath, difficulty breathing or start having chest pain Your symptoms persist after you have completed your treatment plan  MAKE SURE YOU  Understand these instructions. Will watch your condition. Will get help right away if you are not doing well or get worse.  Thank you for choosing an e-visit.  Your e-visit answers were reviewed by a board certified advanced clinical practitioner to complete your personal care plan. Depending upon the condition, your plan could have included both over the counter or prescription medications.  Please review your pharmacy  choice. Make sure the pharmacy is open so you can pick up prescription now. If there is a problem, you may contact your provider through [employer] and have the prescription routed to another pharmacy.  Your safety is important to [place]. If you have drug allergies check your prescription carefully.   For the next 24 hours you can use MyChart to ask questions about today's visit, request a non-urgent call back, or ask for a work or school excuse. You will get an email in the next two days asking about your experience. I hope that your e-visit has been valuable and will speed your recovery.  I have spent 5 minutes in review of e-visit questionnaire, review and updating patient chart, medical decision making and response to patient.   Rica Mast, PhD, FNP-BC

## 2023-06-28 ENCOUNTER — Telehealth (INDEPENDENT_AMBULATORY_CARE_PROVIDER_SITE_OTHER): Payer: BC Managed Care – PPO | Admitting: Family Medicine

## 2023-06-28 ENCOUNTER — Encounter: Payer: Self-pay | Admitting: Family Medicine

## 2023-06-28 VITALS — Temp 99.5°F

## 2023-06-28 DIAGNOSIS — U071 COVID-19: Secondary | ICD-10-CM

## 2023-06-28 MED ORDER — NIRMATRELVIR/RITONAVIR (PAXLOVID)TABLET: 3.0000 | ORAL_TABLET | Freq: Two times a day (BID) | ORAL | 0 refills | Status: AC

## 2023-06-28 NOTE — Progress Notes (Signed)
Virtual Medical Office Visit  Patient:  Laurie Solomon      Age: 52 y.o.       Sex:  female  Date:   06/28/2023  PCP:    Corwin Levins, MD   Today's Healthcare Provider: Karie Georges, MD    Assessment/Plan:   Summary assessment:  Laurie Solomon was seen today for covid positive, sinus problem, sore throat, laryngitis and chest congestion x2 days, used an inhaler with relief.  COVID-19 -     nirmatrelvir/ritonavir; Take 3 tablets by mouth 2 (two) times daily for 5 days. (Take nirmatrelvir 150 mg two tablets twice daily for 5 days and ritonavir 100 mg one tablet twice daily for 5 days) Patient GFR is 83  Dispense: 30 tablet; Refill: 0   Patient does have a history of intermittent asthma and is having to use her inhaler for the SOB. I recommended treatment with Paxlovid because of this history. I reviewed her GFR and her medications to look for potential drug drug interactions. Pt counseled on isolation precautions and to wear a mask if she need to go out in public.   No follow-ups on file.   She was advised to call the office or go to ER if her condition worsens    Subjective:   Laurie Solomon is a 52 y.o. female with PMH significant for: Past Medical History:  Diagnosis Date   Allergic rhinitis    Allergy    Arthritis    Asthma    childhood   Chronic insomnia    Headache(784.0)    Hx of adenomatous polyp of colon 05/13/2021   diminutive x 1   Hypothyroidism 07/09/2015   Post traumatic stress disorder    Vitamin D deficiency 07/09/2015     Presenting today with: Chief Complaint  Patient presents with   Covid Positive    Patient states the home Covid test was positive yesterday   Sinus Problem    Patient complains of head and sinus pressure x2 days, tried decongestants and Theraflu, Flonase    Sore Throat    X3 days   Laryngitis   Chest congestion x2 days, used an inhaler with relief     She clarifies and reports that her condition: Sx started 2 days ago, states  that she went out to eat and went bowing on Friday night, then started with symptoms on Monday. States that her son's teacher has COVID. She is reporting subjective fevers, congestion in chest and sore throat, seems like the congestion is slightly better, some body aches and headaches. Slight SOB but have been using her rescue inhaler which is helping.   She denies having any other associated symptoms, no chest pain.           Objective/Observations  Physical Exam:  Polite and friendly Gen: NAD, resting comfortably Pulm: Normal work of breathing Neuro: Grossly normal, moves all extremities Psych: Normal affect and thought content Problem specific physical exam findings:    No images are attached to the encounter or orders placed in the encounter.    Results: No results found for any visits on 06/28/23.   No results found for this or any previous visit (from the past 2160 hour(s)).         Virtual Visit via Video   I connected with Laurie Solomon on 06/28/23 at 11:00 AM EDT by a video enabled telemedicine application and verified that I am speaking with the correct person using two identifiers. The limitations of evaluation  and management by telemedicine and the availability of in person appointments were discussed. The patient expressed understanding and agreed to proceed.   Percentage of appointment time on video:  100% Patient location: Home Provider location: [place] [facility] Persons participating in the virtual visit: Myself and Patient

## 2023-07-24 NOTE — Telephone Encounter (Signed)
VOB initiated for ZILRETTA for LEFT knee OA

## 2023-07-25 NOTE — Telephone Encounter (Signed)
Zilretta for LEFT knee OA OK to schedule on or after 08/12/23   Primary Insurance: [employer] Co-Pay: $80 Co-Insurance: n/a Deductible: does not apply Prior Auth NOT required   Knee Injection History: 11/22/21 - Kenalog LEFT 03/11/22 - Kenalog LEFT 08/03/22 - Kenalog LEFT 10/10/22 - Zilretta LEFT 02/09/23 - Zilretta LEFT 05/19/23 - Zilretta LEFT

## 2023-07-25 NOTE — Telephone Encounter (Signed)
NO Prior Auth required for [employer]

## 2023-07-25 NOTE — Telephone Encounter (Signed)
Hold until needed.

## 2023-08-14 ENCOUNTER — Ambulatory Visit: Payer: BC Managed Care – PPO | Admitting: Family Medicine

## 2023-08-14 NOTE — Progress Notes (Deleted)
   Rubin Payor, PhD, LAT, ATC acting as a scribe for Clementeen Graham, MD.  Laurie Solomon is a 52 y.o. female who presents to [facility] at [facility] today for cont'd L knee pain. Pt was last seen by Dr. Denyse Amass on 05/19/23 and was given a L knee Zilretta injection and a DEXA scan was ordered. Pt had a surgical consultation w/ Dr. Magnus Ivan on 7/31  Dx testing: 06/22/23 DEXA scan  01/07/23 L knee MRI 03/11/22 L knee XR             11/22/21 L knee XR  Pertinent review of systems: ***  Relevant historical information: ***   Exam:  There were no vitals taken for this visit. General: Well Developed, well nourished, and in no acute distress.   MSK: ***    Lab and Radiology Results No results found for this or any previous visit (from the past 72 hour(s)). No results found.     Assessment and Plan: 52 y.o. female with ***   PDMP not reviewed this encounter. No orders of the defined types were placed in this encounter.  No orders of the defined types were placed in this encounter.    Discussed warning signs or symptoms. Please see discharge instructions. Patient expresses understanding.   ***

## 2023-08-15 ENCOUNTER — Other Ambulatory Visit: Payer: Self-pay

## 2023-08-15 ENCOUNTER — Encounter: Payer: Self-pay | Admitting: Family Medicine

## 2023-08-15 ENCOUNTER — Ambulatory Visit: Payer: BC Managed Care – PPO | Admitting: Family Medicine

## 2023-08-15 VITALS — BP 110/72 | HR 82 | Ht 68.0 in | Wt 161.0 lb

## 2023-08-15 DIAGNOSIS — G8929 Other chronic pain: Secondary | ICD-10-CM

## 2023-08-15 DIAGNOSIS — M1712 Unilateral primary osteoarthritis, left knee: Secondary | ICD-10-CM | POA: Diagnosis not present

## 2023-08-15 DIAGNOSIS — M25562 Pain in left knee: Secondary | ICD-10-CM | POA: Diagnosis not present

## 2023-08-15 MED ORDER — TRIAMCINOLONE ACETONIDE 32 MG IX SRER
32.0000 mg | Freq: Once | INTRA_ARTICULAR | Status: AC
Start: 2023-08-15 — End: 2023-08-15
  Administered 2023-08-15: 32 mg via INTRA_ARTICULAR

## 2023-08-15 NOTE — Patient Instructions (Addendum)
Thank you for coming in today.   You received an injection today. Seek immediate medical attention if the joint becomes red, extremely painful, or is oozing fluid.  

## 2023-08-15 NOTE — Progress Notes (Unsigned)
I, Stevenson Clinch, CMA acting as a scribe for Clementeen Graham, MD.  Laurie Solomon is a 52 y.o. female who presents to [facility] at [facility] today for cont'd L knee pain. Pt was last seen by Dr. Denyse Amass on 05/19/23 and was given a L knee Zilretta injection and a DEXA scan was ordered. Pt had a surgical consultation w/ Dr. Magnus Ivan on 7/31.   Pt reports swelling in the knee, wears compression sleeve. Has been taking Advil of the past couple days. Locates pain to medial aspect and proximal to patella.   Dx testing: 06/22/23 DEXA scan  01/07/23 L knee MRI 03/11/22 L knee XR             11/22/21 L knee XR  Pertinent review of systems: no fever or chills  Relevant historical information: Hx meniscus tear   Exam:  BP 110/72   Pulse 82   Ht 5\' 8"  (1.727 m)   Wt 161 lb (73 kg)   SpO2 98%   BMI 24.48 kg/m  General: Well Developed, well nourished, and in no acute distress.   MSK: Left knee: Mild effusion.  Normal motion.  Intact strength.     Lab and Radiology Results   Zilretta injection left knee Procedure: Real-time Ultrasound Guided Injection of left knee joint superior lateral patellar space Device: Philips Affiniti 50G Images permanently stored and available for review in PACS Verbal informed consent obtained.  Discussed risks and benefits of procedure. Warned about infection, hyperglycemia bleeding, damage to structures among others. Patient expresses understanding and agreement Time-out conducted.   Noted no overlying erythema, induration, or other signs of local infection.   Skin prepped in a sterile fashion.   Local anesthesia: Topical Ethyl chloride.   With sterile technique and under real time ultrasound guidance: Zilretta 32 mg injected into knee joint. Fluid seen entering the joint capsule.   Completed without difficulty   Advised to call if fevers/chills, erythema, induration, drainage, or persistent bleeding.   Images permanently stored and available  for review in the ultrasound unit.  Impression: Technically successful ultrasound guided injection.  Lot number: [id number]     Assessment and Plan: 52 y.o. female with left knee pain due to DJD.  Plan for repeat Zilretta injection.  She is already had a consultation with Dr. Magnus Ivan for total knee replacement.  She is anticipating around June 2025.  Her father has seen Dr. Shelle Iron at [facility] and she would like a second opinion with him as well which I think is reasonable.  Plan refer to [facility] for second surgical opinion regarding total knee replacement.  I can repeat the Zilretta injection in about 3 months which would be early January.   PDMP not reviewed this encounter. Orders Placed This Encounter  Procedures   [place] LIMITED JOINT SPACE STRUCTURES LOW LEFT(NO LINKED CHARGES)    Order Specific Question:   Reason for Exam (SYMPTOM  OR DIAGNOSIS REQUIRED)    Answer:   left knee pain    Order Specific Question:   Preferred imaging location?    Answer:   [facility] referral to Orthopedic Surgery    Referral Priority:   Routine    Referral Type:   Surgical    Referral Reason:   Specialty Services Required    Referred to Provider:   Jene Every, MD    Requested Specialty:   Orthopedic Surgery    Number of Visits Requested:   1  Meds ordered this encounter  Medications   Triamcinolone Acetonide (ZILRETTA) intra-articular injection 32 mg     Discussed warning signs or symptoms. Please see discharge instructions. Patient expresses understanding.   The above documentation has been reviewed and is accurate and complete Clementeen Graham, M.D.

## 2023-08-18 NOTE — Telephone Encounter (Signed)
Pt received Zilretta inj for LEFT knee OA on 08/15/23.  Can consider repeat inj on or after 11/08/23

## 2023-08-21 ENCOUNTER — Ambulatory Visit: Payer: BC Managed Care – PPO | Admitting: Family Medicine

## 2023-09-27 ENCOUNTER — Other Ambulatory Visit: Payer: Self-pay | Admitting: Internal Medicine

## 2023-09-28 ENCOUNTER — Encounter: Payer: Self-pay | Admitting: Internal Medicine

## 2023-09-28 ENCOUNTER — Other Ambulatory Visit: Payer: Self-pay | Admitting: Internal Medicine

## 2023-11-30 ENCOUNTER — Telehealth: Payer: Self-pay | Admitting: Family Medicine

## 2023-11-30 NOTE — Telephone Encounter (Signed)
Pt called to schedule Zilretta L knee  New insurance added in EPIC [facility])  Please authorize Zilretta, I did NOT schedule anything yet but she is ready.

## 2023-12-01 NOTE — Telephone Encounter (Signed)
VOB has been initiated. I will also work on PA today.

## 2023-12-01 NOTE — Telephone Encounter (Addendum)
VOB pending

## 2023-12-01 NOTE — Telephone Encounter (Signed)
VOB initiated for Zilretta for LEFT knee OA

## 2023-12-05 NOTE — Telephone Encounter (Addendum)
Prior Authorization NOT required per [employer]/ [employer]  [employer] and [employer] PRECERT IS NOT REQUIRED OR ONLY REQUIRED IN UNIQUE CIRCUMSTANCES FOR MEDICAID REFER TO PRIOR AUTH TOOL ON [email or web address] FOR ALL OTHERS REFER TO CODE SEARCH TOOL ON [email or web address] COVERAGE OF SERVICES ARE SUBJECT TO BENEFITS AND ELIGIBILITY

## 2023-12-05 NOTE — Telephone Encounter (Signed)
Pharmacy benefit  Prior Authorization initiated for [facility] via [email or web address] KEY: [id number]

## 2023-12-06 ENCOUNTER — Other Ambulatory Visit: Payer: Self-pay

## 2023-12-06 ENCOUNTER — Ambulatory Visit: Payer: 59 | Admitting: Family Medicine

## 2023-12-06 VITALS — BP 110/72 | HR 82 | Ht 68.0 in | Wt 169.0 lb

## 2023-12-06 DIAGNOSIS — M25562 Pain in left knee: Secondary | ICD-10-CM

## 2023-12-06 DIAGNOSIS — G8929 Other chronic pain: Secondary | ICD-10-CM | POA: Diagnosis not present

## 2023-12-06 DIAGNOSIS — M1712 Unilateral primary osteoarthritis, left knee: Secondary | ICD-10-CM

## 2023-12-06 MED ORDER — TRIAMCINOLONE ACETONIDE 32 MG IX SRER
32.0000 mg | Freq: Once | INTRA_ARTICULAR | Status: AC
  Administered 2023-12-06: 32 mg via INTRA_ARTICULAR

## 2023-12-06 NOTE — Patient Instructions (Signed)
Thank you for coming in today.   You received an injection today. Seek immediate medical attention if the joint becomes red, extremely painful, or is oozing fluid.

## 2023-12-06 NOTE — Telephone Encounter (Signed)
Zilretta for LEFT knee OA   Medical [employer] and Bill  Primary Insurance: Aetna [facility] Co-pay: $15 Co-insurance: undisclosed Deductible: does not apply Prior Auth: NOT required  Pharmacy Benefit  Prior Auth: DENIED    Knee Injection History 11/22/21 - Kenalog LEFT 03/11/22 - Kenalog LEFT 08/03/22 - Kenalog LEFT 10/10/22 - Zilretta LEFT 02/09/23 - Zilretta LEFT 05/19/23 - Zilretta LEFT 08/15/23 - Zilretta LEFT

## 2023-12-06 NOTE — Progress Notes (Unsigned)
   Rubin Payor, PhD, LAT, ATC acting as a scribe for Laurie Graham, MD.  Laurie Solomon is a 53 y.o. female who presents to [facility] at [facility] today for exacerbation of her L knee pain. Pt was last seen by Dr. Denyse Solomon on 08/15/23 and was given a L knee Zilretta injection. A referral was placed to Dr. Shelle Solomon for a 2nd surgical opinion.  Today, pt reports L knee pain returned around the end of Dec. She did have a visit w/ Dr. Shelle Solomon, but is unsure how to move forward. Considering surgery sometime this next year.  She would like to target the summer break for knee replacement.  She has an important event in May that she needs to be fit and comfortable 4.  Dx testing: 06/22/23 DEXA scan  01/07/23 L knee MRI 03/11/22 L knee XR             11/22/21 L knee XR  Pertinent review of systems: No fevers or chills  Relevant historical information: Hypothyroidism   Exam:  BP 110/72   Pulse 82   Ht 5\' 8"  (1.727 m)   Wt 169 lb (76.7 kg)   SpO2 98%   BMI 25.70 kg/m  General: Well Developed, well nourished, and in no acute distress.   MSK: Left knee moderate joint effusion.  Normal motion.    Lab and Radiology Results   Zilretta injection left knee Procedure: Real-time Ultrasound Guided Injection of left knee joint superior lateral patellar space Device: Philips Affiniti 50G Images permanently stored and available for review in PACS Verbal informed consent obtained.  Discussed risks and benefits of procedure. Warned about infection, hyperglycemia bleeding, damage to structures among others. Patient expresses understanding and agreement Time-out conducted.   Noted no overlying erythema, induration, or other signs of local infection.   Skin prepped in a sterile fashion.   Local anesthesia: Topical Ethyl chloride.   With sterile technique and under real time ultrasound guidance: Zilretta 32 mg injected into knee joint. Fluid seen entering the joint capsule.   Completed  without difficulty   Advised to call if fevers/chills, erythema, induration, drainage, or persistent bleeding.   Images permanently stored and available for review in the ultrasound unit.  Impression: Technically successful ultrasound guided injection.  Lot number: [id number]     Assessment and Plan: 53 y.o. 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  Plan for Zilretta injection today.  We can repeat the injection in 3 months.  That planned cycle would put her into August for knee replacement.   PDMP not reviewed this encounter. Orders Placed This Encounter  Procedures   [place] LIMITED JOINT SPACE STRUCTURES LOW LEFT(NO LINKED CHARGES)    Reason for Exam (SYMPTOM  OR DIAGNOSIS REQUIRED):   left knee pain    Preferred imaging location?:   [facility]   Meds ordered this encounter  Medications   Triamcinolone Acetonide (ZILRETTA) intra-articular injection 32 mg     Discussed warning signs or symptoms. Please see discharge instructions. Patient expresses understanding.   The above documentation has been reviewed and is accurate and complete Laurie Solomon, M.D.

## 2023-12-06 NOTE — Telephone Encounter (Signed)
Scheduled 12/06/2023.

## 2023-12-07 NOTE — Telephone Encounter (Signed)
Pt received Zilretta inj for LEFT knee OA on 12/06/23. Can consider repeat inj on or after 02/29/24.

## 2023-12-11 ENCOUNTER — Other Ambulatory Visit: Payer: Self-pay | Admitting: Internal Medicine

## 2023-12-12 ENCOUNTER — Other Ambulatory Visit: Payer: Self-pay

## 2023-12-25 NOTE — Telephone Encounter (Signed)
 Patient called stating that she received a letter stating that this was denied and asked for [place] to "Spell out that nothing else works and that this medically necessary".  Just FYI.

## 2023-12-25 NOTE — Telephone Encounter (Signed)
 It's likely because she is not due for repeat injection until some time middle or end of April.

## 2024-01-04 ENCOUNTER — Other Ambulatory Visit: Payer: Self-pay

## 2024-01-04 ENCOUNTER — Other Ambulatory Visit: Payer: Self-pay | Admitting: Internal Medicine

## 2024-01-14 ENCOUNTER — Other Ambulatory Visit: Payer: Self-pay | Admitting: Internal Medicine

## 2024-01-26 ENCOUNTER — Other Ambulatory Visit: Payer: Self-pay | Admitting: Internal Medicine

## 2024-01-26 ENCOUNTER — Other Ambulatory Visit: Payer: Self-pay

## 2024-02-03 ENCOUNTER — Other Ambulatory Visit: Payer: Self-pay | Admitting: Internal Medicine

## 2024-02-05 ENCOUNTER — Telehealth: Admitting: Physician Assistant

## 2024-02-05 ENCOUNTER — Other Ambulatory Visit: Payer: Self-pay

## 2024-02-05 DIAGNOSIS — J029 Acute pharyngitis, unspecified: Secondary | ICD-10-CM

## 2024-02-05 NOTE — Progress Notes (Signed)
 E-Visit for Sore Throat  We are sorry that you are not feeling well.  Here is how we plan to help!  Strep throat typically involves fever, sore throat, and swollen glands in the neck, just under your jaw. It doesn't typically cause eye irritation, congestion, cough, or drainage down the back of your throat. When you look in your throat, someone with strep often has a beefy red appearance to their tonsils and may have white spots on their tonsils or throat.   Based on what you shared with me, it doesn't sound like strep throat. After careful review of your answers, I would not recommend an antibiotic for your condition.  Antibiotics should not be used to treat conditions that we suspect are caused by viruses like the virus that causes the common cold or flu. However, some people can have Strep with atypical symptoms. You may need formal testing in clinic or office to confirm if your symptoms continue or worsen. If you have been exposed to strep throat, you may want to consider in person strep testing to know for sure.   Your symptoms indicate a likely viral infection (Pharyngitis).   Pharyngitis is inflammation in the back of the throat which can cause a sore throat, scratchiness and sometimes difficulty swallowing.   Pharyngitis is typically caused by a respiratory virus and will just run its course.  Please keep in mind that your symptoms could last up to 10 days.    For throat pain, we recommend over the counter oral pain relief medications such as acetaminophen, or anti-inflammatory medications such as ibuprofen or naproxen sodium.  Topical treatments such as oral throat lozenges or sprays may be used as needed.  Avoid close contact with loved ones, especially the very young and elderly.  Remember to wash your hands thoroughly throughout the day as this is the number one way to prevent the spread of infection and wipe down door knobs and counters with disinfectant.  Providers prescribe antibiotics  to treat infections caused by bacteria. Antibiotics are very powerful in treating bacterial infections when they are used properly.  To maintain their effectiveness, they should be used only when necessary.  Overuse of antibiotics has resulted in the development of super bugs that are resistant to treatment!    Home Care: Only take medications as instructed by your medical team. Do not drink alcohol while taking these medications. A steam or ultrasonic humidifier can help congestion.  You can place a towel over your head and breathe in the steam from hot water coming from a faucet. Avoid close contacts especially the very young and the elderly. Cover your mouth when you cough or sneeze. Always remember to wash your hands.  Get Help Right Away If: You develop worsening fever or throat pain. You develop a severe head ache or visual changes. Your symptoms persist after you have completed your treatment plan.  Make sure you Understand these instructions. Will watch your condition. Will get help right away if you are not doing well or get worse.   Thank you for choosing an e-visit.  Your e-visit answers were reviewed by a board certified advanced clinical practitioner to complete your personal care plan. Depending upon the condition, your plan could have included both over the counter or prescription medications.  Please review your pharmacy choice. Make sure the pharmacy is open so you can pick up prescription now. If there is a problem, you may contact your provider through [employer] and have the prescription  routed to another pharmacy.  Your safety is important to [place]. If you have drug allergies check your prescription carefully.   For the next 24 hours you can use MyChart to ask questions about today's visit, request a non-urgent call back, or ask for a work or school excuse. You will get an email in the next two days asking about your experience. I hope that your e-visit has been  valuable and will speed your recovery.  I have spent 5 minutes in review of e-visit questionnaire, review and updating patient chart, medical decision making and response to patient.   Rica Mast, PhD, FNP-BC

## 2024-02-06 MED ORDER — LIDOCAINE VISCOUS HCL 2 % MT SOLN: 15.0000 mL | Freq: Four times a day (QID) | OROMUCOSAL | 0 refills | Status: DC | PRN

## 2024-02-06 NOTE — Addendum Note (Signed)
 Addended by: Waldon Merl on: 02/06/2024 03:36 PM   Modules accepted: Orders

## 2024-02-26 ENCOUNTER — Telehealth: Payer: Self-pay | Admitting: Family Medicine

## 2024-02-26 NOTE — Telephone Encounter (Signed)
 Patient called to ask about her Zilretta  appointment on the 23rd. She is asking if there is anything else she can get that works the same but does not last for three months. She states she is having surgery and is just wanting to know if there is a different injection she can receive.

## 2024-02-26 NOTE — Telephone Encounter (Signed)
 Forwarding to Dr. Denyse Amass to review and advise.

## 2024-02-27 NOTE — Telephone Encounter (Signed)
 Zilretta  approved. Gel is going through PA approval.

## 2024-02-27 NOTE — Telephone Encounter (Signed)
 Patient called back to follow up.  Gave Dr Corey's response. She is going to reach out to the surgeon's office to see if they have a time line of when surgery could be scheduled so she will know how to proceed. Patient is planning to keep her appointment tomorrow and discuss further. She is aware that gel is still waiting for approval.

## 2024-02-27 NOTE — Telephone Encounter (Signed)
 We could get gel injections arranged.  You could have a gel injection and then pretty much immediately have a knee replacement.  I am not sure if they are going to work for you as I think I remember you telling me that they have not worked in the past.  If you are planning on having a knee replacement in the next month or 2 we should either do no injection at all or just the gel injections.  Please let me know what you would like and we can get this arranged.

## 2024-02-27 NOTE — Telephone Encounter (Signed)
 Noted, thank you

## 2024-02-28 ENCOUNTER — Ambulatory Visit: Payer: 59 | Admitting: Family Medicine

## 2024-02-28 ENCOUNTER — Encounter: Payer: Self-pay | Admitting: Family Medicine

## 2024-02-28 ENCOUNTER — Other Ambulatory Visit: Payer: Self-pay

## 2024-02-28 VITALS — BP 108/78 | HR 71 | Ht 68.0 in | Wt 174.0 lb

## 2024-02-28 DIAGNOSIS — M1712 Unilateral primary osteoarthritis, left knee: Secondary | ICD-10-CM

## 2024-02-28 DIAGNOSIS — G8929 Other chronic pain: Secondary | ICD-10-CM

## 2024-02-28 DIAGNOSIS — M25562 Pain in left knee: Secondary | ICD-10-CM | POA: Diagnosis not present

## 2024-02-28 MED ORDER — HYALURONAN 30 MG/2ML IX SOSY
30.0000 mg | PREFILLED_SYRINGE | Freq: Once | INTRA_ARTICULAR | Status: AC
  Administered 2024-02-28: 30 mg via INTRA_ARTICULAR

## 2024-02-28 NOTE — Telephone Encounter (Signed)
 Pt came in today with a denial letter dated 02/15/2024 for the Zilretta  given 1/29/205. Letter states Peer to Peer needed. I was unable to find auth for this DOS, but patient keeps receiving the denial notices and balance is not paid by insurance.

## 2024-02-28 NOTE — Progress Notes (Signed)
   I, Laurie Solomon, CMA acting as a scribe for Laurie Juniper, MD.  Laurie Solomon is a 53 y.o. female who presents to [facility] today for exacerbation of her L knee pain. Pt was last seen by Dr. Alease Hunter on 12/06/23 and was given a repeat Zilretta  injection.  Today, pt reports exacerbation of left knee sx over the past 2 weeks. Notes swelling in the knee. Denies radiating pain or night disturbance. Would like to have surgery for the knee in July if possible. Discuss steroid vs gel shot today.   She would like to have a knee replacement in late June or early July.  She already has an appointment with Dr. Lucienne Ryder. Dx testing: 06/22/23 DEXA scan  01/07/23 L knee MRI 03/11/22 L knee XR             11/22/21 L knee XR  Pertinent review of systems: No fevers or chills  Relevant historical information: Vitamin D  deficiency.   Exam:  BP 108/78   Pulse 71   Ht 5\' 8"  (1.727 m)   Wt 174 lb (78.9 kg)   SpO2 97%   BMI 26.46 kg/m  General: Well Developed, well nourished, and in no acute distress.   MSK: Left knee mild effusion normal motion.    Lab and Radiology Results  Laurie Solomon presents to clinic today for Orthovisc injection left knee 1/3 Procedure: Real-time Ultrasound Guided Injection of left knee joint superior lateral patellar space Device: Philips Affiniti 50G/GE Logiq Images permanently stored and available for review in PACS Verbal informed consent obtained.  Discussed risks and benefits of procedure. Warned about infection, bleeding, damage to structures among others. Patient expresses understanding and agreement Time-out conducted.   Noted no overlying erythema, induration, or other signs of local infection.   Skin prepped in a sterile fashion.   Local anesthesia: Topical Ethyl chloride.   With sterile technique and under real time ultrasound guidance: Orthovisc 30 mg injected into knee joint. Fluid seen entering the joint capsule.   Completed  without difficulty   Advised to call if fevers/chills, erythema, induration, drainage, or persistent bleeding.   Images permanently stored and available for review in the ultrasound unit.  Impression: Technically successful ultrasound guided injection. Lot number: [id number]       Assessment and Plan: 53 y.o. 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  Plan for Orthovisc injection series starting today.  Return in 1 week for Orthovisc injection left knee 2/3.  Go ahead and contact Dr. Lucienne Ryder and let him know test schedule for potential knee replacement for this summer.   PDMP not reviewed this encounter. Orders Placed This Encounter  Procedures   US  LIMITED JOINT SPACE STRUCTURES LOW LEFT(NO LINKED CHARGES)    Reason for Exam (SYMPTOM  OR DIAGNOSIS REQUIRED):   left knee pain / OA    Preferred imaging location?:   [facility]   Meds ordered this encounter  Medications   Hyaluronan (ORTHOVISC) intra-articular injection 30 mg     Discussed warning signs or symptoms. Please see discharge instructions. Patient expresses understanding.   The above documentation has been reviewed and is accurate and complete Laurie Solomon, M.D.

## 2024-02-28 NOTE — Patient Instructions (Signed)
 Thank you for coming in today.   Call or go to the ER if you develop a large red swollen joint with extreme pain or oozing puss.    Schedule weeks 2 and 3 for injection.   Let Dr Lucienne Ryder know your plans for surgery and timeline.

## 2024-03-01 ENCOUNTER — Telehealth: Payer: Self-pay

## 2024-03-01 NOTE — Telephone Encounter (Signed)
 Monovisc authorized for left knee Deductible does not apply OOP MAX $4890 Met $132.14 Once OOP has been met patient is covered at 100% Only one copay per DOS $80  PA Authorized PA # [id number] EXP: 02/25/25

## 2024-03-04 ENCOUNTER — Encounter: Payer: Self-pay | Admitting: Orthopaedic Surgery

## 2024-03-05 NOTE — Telephone Encounter (Signed)
 Documentation printed for reconsideration / appeal.

## 2024-03-05 NOTE — Telephone Encounter (Signed)
 Appeal request, denial letter, and original appeal submitted to [facility] via FAX.

## 2024-03-07 ENCOUNTER — Other Ambulatory Visit: Payer: Self-pay

## 2024-03-07 ENCOUNTER — Ambulatory Visit: Admitting: Family Medicine

## 2024-03-07 DIAGNOSIS — M1712 Unilateral primary osteoarthritis, left knee: Secondary | ICD-10-CM

## 2024-03-07 LAB — HM MAMMOGRAPHY

## 2024-03-07 MED ORDER — HYALURONAN 30 MG/2ML IX SOSY
30.0000 mg | PREFILLED_SYRINGE | Freq: Once | INTRA_ARTICULAR | Status: AC
  Administered 2024-03-07: 30 mg via INTRA_ARTICULAR

## 2024-03-07 NOTE — Progress Notes (Signed)
 Laurie Solomon presents to clinic today for Orthovisc injection left knee 2/3 Procedure: Real-time Ultrasound Guided Injection of left knee joint superior lateral patellar space Device: Philips Affiniti 50G/GE Logiq Images permanently stored and available for review in PACS Verbal informed consent obtained.  Discussed risks and benefits of procedure. Warned about infection, bleeding, damage to structures among others. Patient expresses understanding and agreement Time-out conducted.   Noted no overlying erythema, induration, or other signs of local infection.   Skin prepped in a sterile fashion.   Local anesthesia: Topical Ethyl chloride.   With sterile technique and under real time ultrasound guidance: Orthovisc 30 mg injected into knee joint. Fluid seen entering the joint capsule.   Completed without difficulty   Advised to call if fevers/chills, erythema, induration, drainage, or persistent bleeding.   Images permanently stored and available for review in the ultrasound unit.  Impression: Technically successful ultrasound guided injection. Lot number: [id number] Return in 1 week for Orthovisc injection left knee 3/3

## 2024-03-07 NOTE — Patient Instructions (Addendum)
Thank you for coming in today.   You received an injection today. Seek immediate medical attention if the joint becomes red, extremely painful, or is oozing fluid.   See you next week for the 3rd gel shot.

## 2024-03-13 ENCOUNTER — Encounter: Payer: Self-pay | Admitting: Family Medicine

## 2024-03-14 ENCOUNTER — Ambulatory Visit: Admitting: Family Medicine

## 2024-03-20 ENCOUNTER — Other Ambulatory Visit (INDEPENDENT_AMBULATORY_CARE_PROVIDER_SITE_OTHER): Payer: Self-pay

## 2024-03-20 ENCOUNTER — Ambulatory Visit: Admitting: Orthopaedic Surgery

## 2024-03-20 VITALS — Ht 68.0 in | Wt 174.0 lb

## 2024-03-20 DIAGNOSIS — G8929 Other chronic pain: Secondary | ICD-10-CM

## 2024-03-20 DIAGNOSIS — M25561 Pain in right knee: Secondary | ICD-10-CM | POA: Diagnosis not present

## 2024-03-20 DIAGNOSIS — M25562 Pain in left knee: Secondary | ICD-10-CM | POA: Diagnosis not present

## 2024-03-20 NOTE — Progress Notes (Signed)
 The patient is an active 54 year old female with comes in for the first time.  She sent from Dr. [name] to evaluate and treat known severe arthritis of her left knee.  She says that she likely injured this knee and tore meniscus sometime ago.  At this point her left knee pain is daily and it is detrimentally affecting her mobility, her quality of life and her actives day living.  She has had steroid injections and hyaluronic acid injections.  She is not a diabetic.  She is not on blood thinning medication.  She is otherwise very active individual.  She works for [employer] from home.  I was able to review her past medical history and medications within epic.  Examination of her left knee shows varus malalignment that is correctable.  There is significant medial joint line tenderness and patellofemoral tenderness throughout the arc of motion of the knee with patellofemoral crepitation.  It feels ligamentously stable but there is a large effusion.  I did aspirate 45 cc of fluid from her left knee consistent with osteoarthritis.  X-rays of her left knee show bone-on-bone wear of the medial compartment and patellofemoral joint of her left knee with large osteophytes in all 3 compartments.  X-rays of her right knee shows a well-maintained joint space with some minimal arthritic findings at the patellofemoral joint.  At this point she does wish to proceed with knee replacement surgery.  Family members have had this before and she is fully aware what the surgery involves.  We discussed the risk and benefits of surgery and what to expect from an intraoperative and postoperative standpoint.  All questions and concerns were answered and addressed.  Will work on getting her scheduled for knee replacement surgery sometime in the summer which works out for her as well.  Again, conservative treatment at this point has failed for over a year as a relates to her severe arthritis of this left knee.

## 2024-03-25 ENCOUNTER — Telehealth: Payer: Self-pay | Admitting: Orthopaedic Surgery

## 2024-03-26 ENCOUNTER — Encounter: Payer: Self-pay | Admitting: Orthopaedic Surgery

## 2024-03-26 NOTE — Telephone Encounter (Signed)
   Looks like there was a no pre-auth adjustment added.

## 2024-03-26 NOTE — Telephone Encounter (Signed)
 Ema Hands, can you see if the visit from Jan for Zilretta  every got paid.

## 2024-03-27 ENCOUNTER — Telehealth: Payer: Self-pay

## 2024-03-27 NOTE — Telephone Encounter (Signed)
 I called patient to discuss scheduling surgery.  Left voice mail message for return call.

## 2024-04-09 NOTE — Telephone Encounter (Signed)
 Bedford Bowens, I initiated appeal but I'm not sure how to follow up on it. The [place] provider portal said No PA required. I placed the documentation on your desk.

## 2024-04-15 NOTE — Telephone Encounter (Signed)
 Zilretta  denied for Medical Necessity.

## 2024-04-28 ENCOUNTER — Encounter: Payer: Self-pay | Admitting: Orthopaedic Surgery

## 2024-04-28 NOTE — Progress Notes (Signed)
 COVID Vaccine received:  []  No [x]  Yes Date of any COVID positive Test in last 90 days:  PCP - Lynwood Rush, MD ?   Artist Lloyd, MD Cardiologist - none  Chest x-ray -  EKG -  will do at PST Stress Test -  ECHO -  Cardiac Cath -  CT Coronary Calcium score: 1.4 on 07-22-2021 Epic  Pacemaker / ICD device [x]  No []  Yes   Spinal Cord Stimulator:[x]  No []  Yes       History of Sleep Apnea? [x]  No []  Yes   CPAP used?- [x]  No []  Yes    Does the patient monitor blood sugar?   [x]  N/A   []  No []  Yes  Patient has: [x]  NO Hx DM   []  Pre-DM   []  DM1  []   DM2 OZEMPIC - for weight loss, already on hold  Blood Thinner / Instructions: none Aspirin Instructions:  none  ERAS Protocol Ordered: []  No  [x]  Yes PRE-SURGERY []  ENSURE  [x]  G2  Patient is to be NPO [id number]   Dental hx: []  Dentures:  []  N/A      []  Bridge or Partial:                   []  Loose or Damaged teeth:   Comments:   Activity level: Able to walk up 2 flights of stairs without becoming significantly short of breath or having chest pain?  []  No   []    Yes   Anesthesia review: PTSD, depression, hypothyroid,   Patient denies shortness of breath, fever, cough and chest pain at PAT appointment.  Patient verbalized understanding and agreement to the Pre-Surgical Instructions that were given to them at this PAT appointment. Patient was also educated of the need to review these PAT instructions again prior to her surgery.I reviewed the appropriate phone numbers to call if they have any and questions or concerns.

## 2024-04-28 NOTE — Patient Instructions (Signed)
 SURGICAL WAITING ROOM VISITATION Patients having surgery or a procedure may have no more than 2 support people in the waiting area - these visitors may rotate in the visitor waiting room.   If the patient needs to stay at the hospital during part of their recovery, the visitor guidelines for inpatient rooms apply.  PRE-OP VISITATION  Pre-op nurse will coordinate an appropriate time for 1 support person to accompany the patient in pre-op.  This support person may not rotate.  This visitor will be contacted when the time is appropriate for the visitor to come back in the pre-op area.  Please refer to the [facility] website for the visitor guidelines for Inpatients (after your surgery is over and you are in a regular room).  You are not required to quarantine at this time prior to your surgery. However, you must do this: Hand Hygiene often Do NOT share personal items Notify your provider if you are in close contact with someone who has COVID or you develop fever 100.4 or greater, new onset of sneezing, cough, sore throat, shortness of breath or body aches.  If you test positive for Covid or have been in contact with anyone that has tested positive in the last 10 days please notify you surgeon.    Your procedure is scheduled on:  FRIDAY  May 03, 2024  Report to [facility] Main Entrance: [place] entrance where the [employer] is available.   Report to admitting at: 06:15    AM  Call this number if you have any questions or problems the morning of surgery [phone]  Do not eat food after Midnight the night prior to your surgery/procedure.  After Midnight you may have the following liquids until  05:45  AM DAY OF SURGERY  Clear Liquid Diet Water Black Coffee (sugar ok, NO MILK/CREAM OR CREAMERS)  Tea (sugar ok, NO MILK/CREAM OR CREAMERS) regular and decaf                             Plain Jell-O  with no fruit (NO RED)                                           Fruit ices  (not with fruit pulp, NO RED)                                     Popsicles (NO RED)                                                                  Juice: NO CITRUS JUICES: only apple, WHITE grape, WHITE cranberry Sports drinks like Gatorade or Powerade (NO RED)                   The day of surgery:  Drink ONE (1) Pre-Surgery G2 at 05:45 AM the morning of surgery. Drink in one sitting. Do not sip.  This drink was given to you during your hospital pre-op appointment visit. Nothing else to drink after completing the  Pre-Surgery G2 : No candy, chewing gum or throat lozenges.    FOLLOW ANY ADDITIONAL PRE OP INSTRUCTIONS YOU RECEIVED FROM YOUR SURGEON'S OFFICE!!!   Oral Hygiene is also important to reduce your risk of infection.        Remember - BRUSH YOUR TEETH THE MORNING OF SURGERY WITH YOUR REGULAR TOOTHPASTE  Do NOT smoke after Midnight the night before surgery.  STOP TAKING all Vitamins, Herbs and supplements 1 week before your surgery.   Take ONLY these medicines the morning of surgery with A SIP OF WATER: levothyroxine , minoxidil , loratadine, bupropion                     You may not have any metal on your body including hair pins, jewelry, and body piercing  Do not wear make-up, lotions, powders, perfumes or deodorant  Do not wear nail polish including gel and S&S, artificial / acrylic nails, or any other type of covering on natural nails including finger and toenails. If you have artificial nails, gel coating, etc., that needs to be removed by a nail salon, Please have this removed prior to surgery. Not doing so may mean that your surgery could be cancelled or delayed if the Surgeon or anesthesia staff feels like they are unable to monitor you safely.   Do not shave 48 hours prior to surgery to avoid nicks in your skin which may contribute to postoperative infections.   Contacts, Hearing Aids, dentures or bridgework may not be worn into surgery. DENTURES WILL BE REMOVED PRIOR  TO SURGERY PLEASE DO NOT APPLY Poly grip OR ADHESIVES!!!  You may bring a small overnight bag with you on the day of surgery, only pack items that are not valuable. [facility] IS NOT RESPONSIBLE   FOR VALUABLES THAT ARE LOST OR STOLEN.   Do not bring your home medications to the hospital. The Pharmacy will dispense medications listed on your medication list to you during your admission in the Hospital.  Special Instructions: Bring a copy of your healthcare power of attorney and living will documents the day of surgery, if you wish to have them scanned into your [facility] Records- EPIC  Please read over the following fact sheets you were given: IF YOU HAVE QUESTIONS ABOUT YOUR PRE-OP INSTRUCTIONS, PLEASE CALL [phone].     Pre-operative 5 CHG Bath Instructions   You can play a key role in reducing the risk of infection after surgery. Your skin needs to be as free of germs as possible. You can reduce the number of germs on your skin by washing with CHG (chlorhexidine gluconate) soap before surgery. CHG is an antiseptic soap that kills germs and continues to kill germs even after washing.   DO NOT use if you have an allergy  to chlorhexidine/CHG or antibacterial soaps. If your skin becomes reddened or irritated, stop using the CHG and notify one of our RNs at [phone]  Please shower with the CHG soap starting 4 days before surgery using the following schedule: START SHOWERS ON   MONDAY  April 29, 2024  Please keep in mind the following:  DO NOT shave, including legs and underarms, starting the day of your first shower.   You may shave your face at any point before/day of surgery.   Place clean sheets on your bed the day you start using CHG soap. Use a clean washcloth (not used since being washed) for each  shower. DO NOT sleep with pets once you start using the CHG.   CHG Shower Instructions:  If you choose to wash your hair and private area, wash first with your normal shampoo/soap.  After you use shampoo/soap, rinse your hair and body thoroughly to remove shampoo/soap residue.  Turn the water OFF and apply about 3 tablespoons (45 ml) of CHG soap to a CLEAN washcloth.  Apply CHG soap ONLY FROM YOUR NECK DOWN TO YOUR TOES (washing for 3-5 minutes)  DO NOT use CHG soap on face, private areas, open wounds, or sores.  Pay special attention to the area where your surgery is being performed.  If you are having back surgery, having someone wash your back for you may be helpful.  Wait 2 minutes after CHG soap is applied, then you may rinse off the CHG soap.  Pat dry with a clean towel  Put on clean clothes/pajamas   If you choose to wear lotion, please use ONLY the CHG-compatible lotions on the back of this paper.     Additional instructions for the day of surgery: DO NOT APPLY any lotions, deodorants, cologne, or perfumes.   Put on clean/comfortable clothes.  Brush your teeth.  Ask your nurse before applying any prescription medications to the skin.      CHG Compatible Lotions   Aveeno Moisturizing lotion  Cetaphil Moisturizing Cream  Cetaphil Moisturizing Lotion  Clairol Herbal Essence Moisturizing Lotion, Dry Skin  Clairol Herbal Essence Moisturizing Lotion, Extra Dry Skin  Clairol Herbal Essence Moisturizing Lotion, Normal Skin  Curel Age Defying Therapeutic Moisturizing Lotion with Alpha Hydroxy  Curel Extreme Care Body Lotion  Curel Soothing Hands Moisturizing Hand Lotion  Curel Therapeutic Moisturizing Cream, Fragrance-Free  Curel Therapeutic Moisturizing Lotion, Fragrance-Free  Curel Therapeutic Moisturizing Lotion, Original Formula  Eucerin Daily Replenishing Lotion  Eucerin Dry Skin Therapy Plus Alpha Hydroxy Crme  Eucerin Dry Skin Therapy Plus Alpha Hydroxy Lotion   Eucerin Original Crme  Eucerin Original Lotion  Eucerin Plus Crme Eucerin Plus Lotion  Eucerin TriLipid Replenishing Lotion  Keri Anti-Bacterial Hand Lotion  Keri Deep Conditioning Original Lotion Dry Skin Formula Softly Scented  Keri Deep Conditioning Original Lotion, Fragrance Free Sensitive Skin Formula  Keri Lotion Fast Absorbing Fragrance Free Sensitive Skin Formula  Keri Lotion Fast Absorbing Softly Scented Dry Skin Formula  Keri Original Lotion  Keri Skin Renewal Lotion Keri Silky Smooth Lotion  Keri Silky Smooth Sensitive Skin Lotion  Nivea Body Creamy Conditioning Oil  Nivea Body Extra Enriched Lotion  Nivea Body Original Lotion  Nivea Body Sheer Moisturizing Lotion Nivea Crme  Nivea Skin Firming Lotion  NutraDerm 30 Skin Lotion  NutraDerm Skin Lotion  NutraDerm Therapeutic Skin Cream  NutraDerm Therapeutic Skin Lotion  ProShield Protective Hand Cream  Provon moisturizing lotion   FAILURE TO FOLLOW THESE INSTRUCTIONS MAY RESULT IN THE CANCELLATION OF YOUR SURGERY  PATIENT SIGNATURE_________________________________  NURSE SIGNATURE__________________________________  ________________________________________________________________________        Laurie Solomon    An incentive spirometer is a tool that can help keep your lungs clear and active. This tool measures how well you are filling your lungs with each breath.  Taking long deep breaths may help reverse or decrease the chance of developing breathing (pulmonary) problems (especially infection) following: A long period of time when you are unable to move or be active. BEFORE THE PROCEDURE  If the spirometer includes an indicator to show your best effort, your nurse or respiratory therapist will set it to a desired goal. If possible, sit up straight or lean slightly forward. Try not to slouch. Hold the incentive spirometer in an upright position. INSTRUCTIONS FOR USE  Sit on the edge of your bed if  possible, or sit up as far as you can in bed or on a chair. Hold the incentive spirometer in an upright position. Breathe out normally. Place the mouthpiece in your mouth and seal your lips tightly around it. Breathe in slowly and as deeply as possible, raising the piston or the ball toward the top of the column. Hold your breath for 3-5 seconds or for as long as possible. Allow the piston or ball to fall to the bottom of the column. Remove the mouthpiece from your mouth and breathe out normally. Rest for a few seconds and repeat Steps 1 through 7 at least 10 times every 1-2 hours when you are awake. Take your time and take a few normal breaths between deep breaths. The spirometer may include an indicator to show your best effort. Use the indicator as a goal to work toward during each repetition. After each set of 10 deep breaths, practice coughing to be sure your lungs are clear. If you have an incision (the cut made at the time of surgery), support your incision when coughing by placing a pillow or rolled up towels firmly against it. Once you are able to get out of bed, walk around indoors and cough well. You may stop using the incentive spirometer when instructed by your caregiver.  RISKS AND COMPLICATIONS Take your time so you do not get dizzy or light-headed. If you are in pain, you may need to take or ask for pain medication before doing incentive spirometry. It is harder to take a deep breath if you are having pain. AFTER USE Rest and breathe slowly and easily. It can be helpful to keep track of a log of your progress. Your caregiver can provide you with a simple table to help with this. If you are using the spirometer at home, follow these instructions: SEEK MEDICAL CARE IF:  You are having difficultly using the spirometer. You have trouble using the spirometer as often as instructed. Your pain medication is not giving enough relief while using the spirometer. You develop fever of  100.5 F (38.1 C) or higher.                                                                                                    SEEK IMMEDIATE MEDICAL CARE IF:  You cough up bloody sputum that had not been present before. You develop fever of 102 F (38.9 C) or greater. You develop worsening pain at or near the incision site. MAKE SURE YOU:  Understand these instructions. Will watch your  condition. Will get help right away if you are not doing well or get worse. Document Released: 03/06/2007 Document Revised: 01/16/2012 Document Reviewed: 05/07/2007 [facility] Patient Information 2014 [place], [place].       If you would like to see a video about joint replacement:   [email or web address]

## 2024-04-29 ENCOUNTER — Encounter: Payer: Self-pay | Admitting: Orthopaedic Surgery

## 2024-04-29 ENCOUNTER — Encounter (HOSPITAL_COMMUNITY)
Admission: RE | Admit: 2024-04-29 | Discharge: 2024-04-29 | Disposition: A | Discharge status: Home / Self Care | Source: Ambulatory Visit | Attending: Orthopaedic Surgery | Admitting: Orthopaedic Surgery

## 2024-04-29 ENCOUNTER — Encounter (HOSPITAL_COMMUNITY): Payer: Self-pay

## 2024-04-29 ENCOUNTER — Other Ambulatory Visit: Payer: Self-pay

## 2024-04-29 ENCOUNTER — Encounter: Payer: Self-pay | Admitting: Internal Medicine

## 2024-04-29 VITALS — BP 120/72 | HR 84 | Temp 97.7°F | Resp 16 | Ht 67.5 in | Wt 171.0 lb

## 2024-04-29 DIAGNOSIS — Z01818 Encounter for other preprocedural examination: Secondary | ICD-10-CM | POA: Diagnosis present

## 2024-04-29 DIAGNOSIS — M1712 Unilateral primary osteoarthritis, left knee: Secondary | ICD-10-CM | POA: Diagnosis not present

## 2024-04-29 DIAGNOSIS — E7800 Pure hypercholesterolemia, unspecified: Secondary | ICD-10-CM | POA: Insufficient documentation

## 2024-04-29 DIAGNOSIS — R931 Abnormal findings on diagnostic imaging of heart and coronary circulation: Secondary | ICD-10-CM | POA: Insufficient documentation

## 2024-04-29 HISTORY — DX: Abnormal findings on diagnostic imaging of heart and coronary circulation: R93.1

## 2024-04-29 LAB — BASIC METABOLIC PANEL WITH GFR
Anion gap: 10 (ref 5–15)
BUN: 12 mg/dL (ref 6–20)
CO2: 23 mmol/L (ref 22–32)
Calcium: 8.8 mg/dL — ABNORMAL LOW (ref 8.9–10.3)
Chloride: 101 mmol/L (ref 98–111)
Creatinine, Ser: 0.75 mg/dL (ref 0.44–1.00)
GFR, Estimated: >60 mL/min (ref >60)
Glucose, Bld: 89 mg/dL (ref 70–99)
Potassium: 4 mmol/L (ref 3.5–5.1)
Sodium: 134 mmol/L — ABNORMAL LOW (ref 135–145)

## 2024-04-29 LAB — CBC
HCT: 42.1 % (ref 36.0–46.0)
Hemoglobin: 14.3 g/dL (ref 12.0–15.0)
MCH: 30.8 pg (ref 26.0–34.0)
MCHC: 34 g/dL (ref 30.0–36.0)
MCV: 90.5 fL (ref 80.0–100.0)
Platelets: 331 10*3/uL (ref 150–400)
RBC: 4.65 MIL/uL (ref 3.87–5.11)
RDW: 11.9 % (ref 11.5–15.5)
WBC: 7.2 10*3/uL (ref 4.0–10.5)
nRBC: 0 % (ref 0.0–0.2)

## 2024-04-30 LAB — SURGICAL PCR SCREEN
MRSA, PCR: POSITIVE — AB
Staphylococcus aureus: POSITIVE — AB

## 2024-04-30 NOTE — Progress Notes (Signed)
 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 Appropriate notes have been placed on the patient's chart. This note has been routed to Dr. Vernetta and Bertrum Gaskins PA, for review. The Patient's surgery is currently scheduled for:  05-03-24  at [facility].  Shawnee Aloe, BSN, CVRN-BC   Pre-Surgical Testing Nurse [facility]  [phone]

## 2024-05-02 NOTE — Anesthesia Preprocedure Evaluation (Addendum)
 Anesthesia Evaluation  Patient identified by MRN, date of birth, ID band Patient awake    Reviewed: Allergy  & Precautions, NPO status , Patient's Chart, lab work & pertinent test results  History of Anesthesia Complications Negative for: history of anesthetic complications  Airway Mallampati: II  TM Distance: >3 FB Neck ROM: Full    Dental  (+) Dental Advisory Given   Pulmonary neg shortness of breath, asthma (no recent inhaler use) , neg sleep apnea, neg COPD, neg recent URI   Pulmonary exam normal breath sounds clear to auscultation       Cardiovascular (-) hypertension(-) angina (-) Past MI, (-) Cardiac Stents and (-) CABG (-) dysrhythmias  Rhythm:Regular Rate:Normal  HLD   Neuro/Psych  Headaches, neg Seizures PSYCHIATRIC DISORDERS (PTSD) Anxiety Depression       GI/Hepatic negative GI ROS, Neg liver ROS,,,  Endo/Other  neg diabetesHypothyroidism    Renal/GU negative Renal ROS     Musculoskeletal  (+) Arthritis , Osteoarthritis,    Abdominal   Peds  Hematology negative hematology ROS (+) Lab Results      Component                Value               Date                      WBC                      7.2                 04/29/2024                HGB                      14.3                04/29/2024                HCT                      42.1                04/29/2024                MCV                      90.5                04/29/2024                PLT                      331                 04/29/2024              Anesthesia Other Findings Last Ozempic : 2 weeks ago  Reproductive/Obstetrics                             Anesthesia Physical Anesthesia Plan  ASA: 2  Anesthesia Plan: MAC and Spinal   Post-op Pain Management: Regional block*, Tylenol  PO (pre-op)* and Toradol IV (intra-op)*   Induction: Intravenous  PONV Risk Score and Plan: 2 and Ondansetron, Dexamethasone ,  Propofol infusion, TIVA, Midazolam and Treatment may vary due  to age or medical condition  Airway Management Planned: Natural Airway and Simple Face Mask  Additional Equipment:   Intra-op Plan:   Post-operative Plan:   Informed Consent: I have reviewed the patients History and Physical, chart, labs and discussed the procedure including the risks, benefits and alternatives for the proposed anesthesia with the patient or authorized representative who has indicated his/her understanding and acceptance.     Dental advisory given  Plan Discussed with: CRNA and Anesthesiologist  Anesthesia Plan Comments: (Discussed potential risks of nerve blocks including, but not limited to, infection, bleeding, nerve damage, seizures, pneumothorax, respiratory depression, and potential failure of the block. Alternatives to nerve blocks discussed. All questions answered.  I have discussed risks of neuraxial anesthesia including but not limited to infection, bleeding, nerve injury, back pain, headache, seizures, and failure of block. Patient denies bleeding disorders and is not currently anticoagulated. Labs have been reviewed. Risks and benefits discussed. All patient's questions answered.   Discussed with patient risks of MAC including, but not limited to, minor pain or discomfort, hearing people in the room, and possible need for backup general anesthesia. Risks for general anesthesia also discussed including, but not limited to, sore throat, hoarse voice, chipped/damaged teeth, injury to vocal cords, nausea and vomiting, allergic reactions, lung infection, heart attack, stroke, and death. All questions answered. )        Anesthesia Quick Evaluation

## 2024-05-02 NOTE — H&P (Signed)
 TOTAL KNEE ADMISSION H&P  Patient is being admitted for left total knee arthroplasty.  Subjective:  Chief Complaint:left knee pain.  HPI: Laurie Solomon, 53 y.o. female, has a history of pain and functional disability in the left knee due to arthritis and has failed non-surgical conservative treatments for greater than 12 weeks to includeNSAID's and/or analgesics, corticosteriod injections, viscosupplementation injections, flexibility and strengthening excercises, and activity modification.  Onset of symptoms was gradual, starting several years ago with gradually worsening course since that time. The patient noted no past surgery on the left knee(s).  Patient currently rates pain in the left knee(s) at 10 out of 10 with activity. Patient has night pain, worsening of pain with activity and weight bearing, pain that interferes with activities of daily living, pain with passive range of motion, crepitus, and joint swelling.  Patient has evidence of subchondral sclerosis, periarticular osteophytes, and joint space narrowing by imaging studies. There is no active infection.  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   Depression 10/10/2020   Insect bite 10/10/2020   Right ear pain 10/08/2020   ETD (Eustachian tube dysfunction), bilateral 10/05/2020   Non-recurrent acute suppurative otitis media of right ear with spontaneous rupture of tympanic membrane 10/05/2020   Rhinitis, chronic 10/05/2020   Chronic cholecystitis 04/29/2020   Epigastric pain 12/03/2018   Snoring 10/13/2016   Asthma with exacerbation 10/13/2016   Vitamin D  deficiency 07/09/2015   Hypothyroidism 07/09/2015   Morbid obesity (HCC) 01/18/2013   Encounter for well adult exam with abnormal findings 05/02/2011   Left knee pain  05/02/2011   Seasonal and perennial allergic rhinitis 03/01/2010   SNORING 03/01/2010   Allergic-infective asthma 12/16/2008   INSOMNIA, CHRONIC 07/25/2007   PTSD 07/25/2007   Headache(784.0) 07/25/2007   Past Medical History:  Diagnosis Date   Agatston coronary artery calcium score less than 100    Allergic rhinitis    Allergy     Arthritis    Asthma    childhood   Chronic insomnia    Headache(784.0)    Hx of adenomatous polyp of colon 05/13/2021   diminutive x 1   Hypothyroidism 07/09/2015   Post traumatic stress disorder    Vitamin D  deficiency 07/09/2015    Past Surgical History:  Procedure Laterality Date   COLONOSCOPY W/ POLYPECTOMY  05/13/2021   REFRACTIVE SURGERY Bilateral 2010   Lasik   WISDOM TOOTH EXTRACTION      No current facility-administered medications for this encounter.   Current Outpatient Medications  Medication Sig Dispense Refill Last Dose/Taking   buPROPion  (WELLBUTRIN  XL) 300 MG 24 hr tablet TAKE 1 TABLET BY MOUTH EVERY DAY 90 tablet 1 Taking   calcium carbonate (OSCAL) 1500 (600 Ca) MG TABS tablet Take 600 mg of elemental calcium by mouth 2 (two) times daily with a meal.   Taking   clonazePAM  (KLONOPIN ) 1 MG tablet TAKE 1/2 TABLET BY MOUTH IN THE MORNING AND 1 TABLET AT BEDTIME (Patient taking differently: Take 0.5 mg by mouth at bedtime.) 60 tablet 2 Taking Differently   estradiol (ESTRACE) 0.1 MG/GM vaginal cream Place 1 g vaginally daily as needed (irritation).   Taking As Needed   IUD'S IU 1 Intra Uterine Device by Intrauterine route once.   Taking   levothyroxine  (SYNTHROID ) 25 MCG tablet TAKE 1 TABLET BY MOUTH EVERY  DAY BEFORE BREAKFAST 90 tablet 3 Taking   loratadine (CLARITIN) 10 MG tablet Take 10 mg by mouth daily.   Taking   minoxidil  (LONITEN ) 2.5 MG tablet TAKE 1 TABLET BY MOUTH EVERY DAY 90 tablet 1 Taking   Prenatal Vit-Fe Fumarate-FA (PRENATAL MULTIVITAMIN) TABS tablet Take 1 tablet by mouth daily at 12 noon.   Taking   Probiotic CHEW  Chew 2 tablets by mouth daily.   Taking   Semaglutide , 2 MG/DOSE, (OZEMPIC , 2 MG/DOSE,) 8 MG/3ML SOPN Inject 2 mg into the skin once a week.   Taking   tretinoin  (RETIN-A ) 0.1 % cream Apply 1 application  topically at bedtime. (Patient taking differently: Apply 1 application  topically at bedtime as needed (acne).) 45 g 1 Taking Differently   No Known Allergies  Social History   Tobacco Use   Smoking status: Never   Smokeless tobacco: Never  Substance Use Topics   Alcohol use: Yes    Alcohol/week: 2.0 standard drinks of alcohol    Types: 2 drink(s) per week    Comment: OCC    Family History  Problem Relation Age of Onset   Heart disease Maternal Grandfather    Hypertension Maternal Grandfather    Diabetes Paternal Grandfather    Breast cancer Other        maternal cousin   Breast cancer Other        great aunt   Colon cancer Neg Hx    Colon polyps Neg Hx    Esophageal cancer Neg Hx    Stomach cancer Neg Hx    Rectal cancer Neg Hx      Review of Systems  Objective:  Physical Exam Vitals reviewed.  Constitutional:      Appearance: Normal appearance. She is normal weight.  HENT:     Head: Normocephalic and atraumatic.   Eyes:     Extraocular Movements: Extraocular movements intact.     Pupils: Pupils are equal, round, and reactive to light.    Cardiovascular:     Rate and Rhythm: Normal rate and regular rhythm.     Pulses: Normal pulses.  Pulmonary:     Effort: Pulmonary effort is normal.     Breath sounds: Normal breath sounds.  Abdominal:     Palpations: Abdomen is soft.   Musculoskeletal:     Cervical back: Normal range of motion and neck supple.     Left knee: Effusion, bony tenderness and crepitus present. Decreased range of motion. Tenderness present over the medial joint line and lateral joint line. Abnormal alignment and abnormal meniscus.   Neurological:     Mental Status: She is alert and oriented to person, place, and time.   Psychiatric:         Behavior: Behavior normal.     Vital signs in last 24 hours:    Labs:   Estimated body mass index is 26.39 kg/m as calculated from the following:   Height as of 04/29/24: 5' 7.5 (1.715 m).   Weight as of 04/29/24: 77.6 kg.   Imaging Review Plain radiographs demonstrate severe degenerative joint disease of the left knee(s). The overall alignment ismild varus. The bone quality appears to be excellent for age and reported activity level.      Assessment/Plan:  End stage arthritis, left knee   The patient history, physical examination, clinical judgment of the provider and imaging studies are consistent with end stage degenerative joint disease of the left knee(s) and total knee arthroplasty is deemed medically necessary.  The treatment options including medical management, injection therapy arthroscopy and arthroplasty were discussed at length. The risks and benefits of total knee arthroplasty were presented and reviewed. The risks due to aseptic loosening, infection, stiffness, patella tracking problems, thromboembolic complications and other imponderables were discussed. The patient acknowledged the explanation, agreed to proceed with the plan and consent was signed. Patient is being admitted for inpatient treatment for surgery, pain control, PT, OT, prophylactic antibiotics, VTE prophylaxis, progressive ambulation and ADL's and discharge planning. The patient is planning to be discharged home with home health services

## 2024-05-03 ENCOUNTER — Observation Stay (HOSPITAL_COMMUNITY)

## 2024-05-03 ENCOUNTER — Other Ambulatory Visit: Payer: Self-pay

## 2024-05-03 ENCOUNTER — Ambulatory Visit (HOSPITAL_COMMUNITY): Payer: Self-pay | Admitting: Anesthesiology

## 2024-05-03 ENCOUNTER — Observation Stay (HOSPITAL_COMMUNITY)
Admission: RE | Admit: 2024-05-03 | Discharge: 2024-05-05 | Disposition: A | Discharge status: Home / Self Care | Attending: Orthopaedic Surgery | Admitting: Orthopaedic Surgery

## 2024-05-03 ENCOUNTER — Encounter (HOSPITAL_COMMUNITY)
Admission: RE | Disposition: A | Discharge status: Home / Self Care | Payer: Self-pay | Source: Home / Self Care | Attending: Orthopaedic Surgery

## 2024-05-03 ENCOUNTER — Encounter (HOSPITAL_COMMUNITY): Payer: Self-pay | Admitting: Orthopaedic Surgery

## 2024-05-03 ENCOUNTER — Ambulatory Visit (HOSPITAL_BASED_OUTPATIENT_CLINIC_OR_DEPARTMENT_OTHER): Payer: Self-pay | Admitting: Anesthesiology

## 2024-05-03 DIAGNOSIS — J45909 Unspecified asthma, uncomplicated: Secondary | ICD-10-CM | POA: Insufficient documentation

## 2024-05-03 DIAGNOSIS — M25562 Pain in left knee: Secondary | ICD-10-CM | POA: Diagnosis present

## 2024-05-03 DIAGNOSIS — Z96652 Presence of left artificial knee joint: Secondary | ICD-10-CM | POA: Diagnosis not present

## 2024-05-03 DIAGNOSIS — M1712 Unilateral primary osteoarthritis, left knee: Principal | ICD-10-CM | POA: Diagnosis present

## 2024-05-03 DIAGNOSIS — Z7982 Long term (current) use of aspirin: Secondary | ICD-10-CM | POA: Diagnosis not present

## 2024-05-03 DIAGNOSIS — F1090 Alcohol use, unspecified, uncomplicated: Secondary | ICD-10-CM | POA: Insufficient documentation

## 2024-05-03 DIAGNOSIS — E039 Hypothyroidism, unspecified: Secondary | ICD-10-CM | POA: Diagnosis not present

## 2024-05-03 HISTORY — PX: TOTAL KNEE ARTHROPLASTY: SHX125

## 2024-05-03 LAB — POCT PREGNANCY, URINE: Preg Test, Ur: NEGATIVE

## 2024-05-03 SURGERY — ARTHROPLASTY, KNEE, TOTAL
Anesthesia: Monitor Anesthesia Care | Site: Knee | Laterality: Left

## 2024-05-03 MED ORDER — PROPOFOL 10 MG/ML IV BOLUS
INTRAVENOUS | Status: AC
  Filled 2024-05-03: qty 20

## 2024-05-03 MED ORDER — DIPHENHYDRAMINE HCL 12.5 MG/5ML PO ELIX: 12.5000 mg | ORAL_SOLUTION | ORAL | Status: DC | PRN

## 2024-05-03 MED ORDER — CHLORHEXIDINE GLUCONATE 4 % EX SOLN: 1.0000 | CUTANEOUS | 1 refills | Status: DC

## 2024-05-03 MED ORDER — VANCOMYCIN HCL IN DEXTROSE 1-5 GM/200ML-% IV SOLN
1000.0000 mg | INTRAVENOUS | Status: AC
  Administered 2024-05-03: 1000 mg via INTRAVENOUS
  Filled 2024-05-03: qty 200

## 2024-05-03 MED ORDER — CHLORHEXIDINE GLUCONATE 0.12 % MT SOLN
15.0000 mL | Freq: Once | OROMUCOSAL | Status: AC
  Administered 2024-05-03: 15 mL via OROMUCOSAL

## 2024-05-03 MED ORDER — BUPIVACAINE-EPINEPHRINE 0.25% -1:200000 IJ SOLN
INTRAMUSCULAR | Status: DC | PRN
  Administered 2024-05-03: 30 mL

## 2024-05-03 MED ORDER — TRANEXAMIC ACID-NACL 1000-0.7 MG/100ML-% IV SOLN
1000.0000 mg | INTRAVENOUS | Status: AC
  Administered 2024-05-03: 1000 mg via INTRAVENOUS
  Filled 2024-05-03: qty 100

## 2024-05-03 MED ORDER — ASPIRIN 81 MG PO CHEW
81.0000 mg | CHEWABLE_TABLET | Freq: Two times a day (BID) | ORAL | Status: DC
  Administered 2024-05-03 – 2024-05-05 (×4): 81 mg via ORAL
  Filled 2024-05-03 (×4): qty 1

## 2024-05-03 MED ORDER — ONDANSETRON HCL 4 MG/2ML IJ SOLN
INTRAMUSCULAR | Status: AC
  Filled 2024-05-03: qty 2

## 2024-05-03 MED ORDER — LEVOTHYROXINE SODIUM 25 MCG PO TABS
25.0000 ug | ORAL_TABLET | Freq: Every day | ORAL | Status: DC
  Administered 2024-05-04 – 2024-05-05 (×2): 25 ug via ORAL
  Filled 2024-05-03 (×2): qty 1

## 2024-05-03 MED ORDER — ACETAMINOPHEN 325 MG PO TABS
325.0000 mg | ORAL_TABLET | Freq: Four times a day (QID) | ORAL | Status: DC | PRN
  Administered 2024-05-05 (×2): 650 mg via ORAL
  Filled 2024-05-03 (×2): qty 2

## 2024-05-03 MED ORDER — FENTANYL CITRATE (PF) 100 MCG/2ML IJ SOLN
INTRAMUSCULAR | Status: AC
Start: 2024-05-03 — End: 2024-05-03
  Filled 2024-05-03: qty 2

## 2024-05-03 MED ORDER — METHOCARBAMOL 500 MG PO TABS
500.0000 mg | ORAL_TABLET | Freq: Four times a day (QID) | ORAL | Status: DC | PRN
  Administered 2024-05-03 – 2024-05-05 (×6): 500 mg via ORAL
  Filled 2024-05-03 (×6): qty 1

## 2024-05-03 MED ORDER — ONDANSETRON HCL 4 MG PO TABS
4.0000 mg | ORAL_TABLET | Freq: Four times a day (QID) | ORAL | Status: DC | PRN
  Administered 2024-05-04: 4 mg via ORAL
  Filled 2024-05-03: qty 1

## 2024-05-03 MED ORDER — DEXAMETHASONE SODIUM PHOSPHATE 10 MG/ML IJ SOLN
INTRAMUSCULAR | Status: AC
  Filled 2024-05-03: qty 1

## 2024-05-03 MED ORDER — DOCUSATE SODIUM 100 MG PO CAPS
100.0000 mg | ORAL_CAPSULE | Freq: Two times a day (BID) | ORAL | Status: DC
  Administered 2024-05-03 – 2024-05-05 (×5): 100 mg via ORAL
  Filled 2024-05-03 (×5): qty 1

## 2024-05-03 MED ORDER — ROPIVACAINE HCL 5 MG/ML IJ SOLN
INTRAMUSCULAR | Status: DC | PRN
Start: 2024-05-03 — End: 2024-05-03
  Administered 2024-05-03: 20 mL via PERINEURAL

## 2024-05-03 MED ORDER — LACTATED RINGERS IV SOLN: INTRAVENOUS | Status: DC

## 2024-05-03 MED ORDER — OXYCODONE HCL 5 MG PO TABS: 5.0000 mg | ORAL_TABLET | Freq: Once | ORAL | Status: DC | PRN

## 2024-05-03 MED ORDER — MUPIROCIN 2 % EX OINT: 1.0000 | TOPICAL_OINTMENT | Freq: Two times a day (BID) | CUTANEOUS | 0 refills | Status: AC

## 2024-05-03 MED ORDER — 0.9 % SODIUM CHLORIDE (POUR BTL) OPTIME
TOPICAL | Status: DC | PRN
  Administered 2024-05-03: 1000 mL

## 2024-05-03 MED ORDER — SODIUM CHLORIDE 0.9 % IV SOLN
INTRAVENOUS | Status: AC
Start: 2024-05-03 — End: 2024-05-04

## 2024-05-03 MED ORDER — KETOROLAC TROMETHAMINE 15 MG/ML IJ SOLN
7.5000 mg | Freq: Four times a day (QID) | INTRAMUSCULAR | Status: AC
  Administered 2024-05-03 (×2): 7.5 mg via INTRAVENOUS
  Filled 2024-05-03 (×3): qty 1

## 2024-05-03 MED ORDER — HYDROMORPHONE HCL 1 MG/ML IJ SOLN: 0.2500 mg | INTRAMUSCULAR | Status: DC | PRN

## 2024-05-03 MED ORDER — HYDROMORPHONE HCL 1 MG/ML IJ SOLN
0.5000 mg | INTRAMUSCULAR | Status: DC | PRN
  Administered 2024-05-03 – 2024-05-04 (×4): 1 mg via INTRAVENOUS
  Filled 2024-05-03 (×5): qty 1

## 2024-05-03 MED ORDER — ACETAMINOPHEN 500 MG PO TABS
1000.0000 mg | ORAL_TABLET | Freq: Once | ORAL | Status: AC
  Administered 2024-05-03: 1000 mg via ORAL
  Filled 2024-05-03: qty 2

## 2024-05-03 MED ORDER — DEXAMETHASONE SODIUM PHOSPHATE 10 MG/ML IJ SOLN
INTRAMUSCULAR | Status: DC | PRN
Start: 2024-05-03 — End: 2024-05-03
  Administered 2024-05-03: 10 mg via INTRAVENOUS

## 2024-05-03 MED ORDER — OXYCODONE HCL 5 MG PO TABS
5.0000 mg | ORAL_TABLET | ORAL | Status: DC | PRN
  Administered 2024-05-03 (×2): 10 mg via ORAL
  Administered 2024-05-05: 5 mg via ORAL
  Administered 2024-05-05: 10 mg via ORAL
  Filled 2024-05-03 (×3): qty 2
  Filled 2024-05-03: qty 1
  Filled 2024-05-03: qty 2

## 2024-05-03 MED ORDER — CLONAZEPAM 0.5 MG PO TABS
0.5000 mg | ORAL_TABLET | Freq: Every day | ORAL | Status: DC
  Administered 2024-05-03 – 2024-05-04 (×2): 0.5 mg via ORAL
  Filled 2024-05-03 (×2): qty 1

## 2024-05-03 MED ORDER — VANCOMYCIN HCL IN DEXTROSE 1-5 GM/200ML-% IV SOLN
1000.0000 mg | Freq: Two times a day (BID) | INTRAVENOUS | Status: AC
  Administered 2024-05-03: 1000 mg via INTRAVENOUS
  Filled 2024-05-03: qty 200

## 2024-05-03 MED ORDER — OXYCODONE HCL 5 MG PO TABS
10.0000 mg | ORAL_TABLET | ORAL | Status: DC | PRN
  Administered 2024-05-03 – 2024-05-04 (×2): 10 mg via ORAL
  Administered 2024-05-04 (×2): 15 mg via ORAL
  Administered 2024-05-04: 10 mg via ORAL
  Administered 2024-05-04: 15 mg via ORAL
  Administered 2024-05-05: 10 mg via ORAL
  Filled 2024-05-03: qty 3
  Filled 2024-05-03 (×3): qty 2
  Filled 2024-05-03 (×2): qty 3

## 2024-05-03 MED ORDER — BUPROPION HCL ER (XL) 300 MG PO TB24
300.0000 mg | ORAL_TABLET | Freq: Every day | ORAL | Status: DC
  Administered 2024-05-04 – 2024-05-05 (×2): 300 mg via ORAL
  Filled 2024-05-03 (×2): qty 1

## 2024-05-03 MED ORDER — OXYCODONE HCL 5 MG/5ML PO SOLN: 5.0000 mg | Freq: Once | ORAL | Status: DC | PRN

## 2024-05-03 MED ORDER — POVIDONE-IODINE 10 % EX SWAB: 2.0000 | Freq: Once | CUTANEOUS | Status: DC

## 2024-05-03 MED ORDER — METOCLOPRAMIDE HCL 5 MG PO TABS
5.0000 mg | ORAL_TABLET | Freq: Three times a day (TID) | ORAL | Status: DC | PRN
  Administered 2024-05-04 – 2024-05-05 (×2): 5 mg via ORAL
  Filled 2024-05-03 (×2): qty 1

## 2024-05-03 MED ORDER — MENTHOL 3 MG MT LOZG: 1.0000 | LOZENGE | OROMUCOSAL | Status: DC | PRN

## 2024-05-03 MED ORDER — CEFAZOLIN SODIUM-DEXTROSE 2-4 GM/100ML-% IV SOLN
2.0000 g | INTRAVENOUS | Status: AC
  Administered 2024-05-03: 2 g via INTRAVENOUS
  Filled 2024-05-03: qty 100

## 2024-05-03 MED ORDER — AMISULPRIDE (ANTIEMETIC) 5 MG/2ML IV SOLN: 10.0000 mg | Freq: Once | INTRAVENOUS | Status: DC | PRN

## 2024-05-03 MED ORDER — PROPOFOL 500 MG/50ML IV EMUL
INTRAVENOUS | Status: DC | PRN
  Administered 2024-05-03 (×2): 20 mg via INTRAVENOUS
  Administered 2024-05-03: 50 ug/kg/min via INTRAVENOUS

## 2024-05-03 MED ORDER — ALUM & MAG HYDROXIDE-SIMETH 200-200-20 MG/5ML PO SUSP
30.0000 mL | ORAL | Status: DC | PRN
Start: 2024-05-03 — End: 2024-05-05

## 2024-05-03 MED ORDER — ORAL CARE MOUTH RINSE: 15.0000 mL | Freq: Once | OROMUCOSAL | Status: AC

## 2024-05-03 MED ORDER — LIDOCAINE HCL (PF) 2 % IJ SOLN
INTRAMUSCULAR | Status: AC
  Filled 2024-05-03: qty 5

## 2024-05-03 MED ORDER — LORATADINE 10 MG PO TABS
10.0000 mg | ORAL_TABLET | Freq: Every day | ORAL | Status: DC
  Administered 2024-05-03 – 2024-05-05 (×3): 10 mg via ORAL
  Filled 2024-05-03 (×3): qty 1

## 2024-05-03 MED ORDER — FENTANYL CITRATE PF 50 MCG/ML IJ SOSY: 50.0000 ug | PREFILLED_SYRINGE | INTRAMUSCULAR | Status: DC

## 2024-05-03 MED ORDER — FENTANYL CITRATE (PF) 100 MCG/2ML IJ SOLN
INTRAMUSCULAR | Status: DC | PRN
  Administered 2024-05-03: 50 ug via INTRAVENOUS

## 2024-05-03 MED ORDER — METHOCARBAMOL 1000 MG/10ML IJ SOLN: 500.0000 mg | Freq: Four times a day (QID) | INTRAMUSCULAR | Status: DC | PRN

## 2024-05-03 MED ORDER — SODIUM CHLORIDE 0.9 % IR SOLN
Status: DC | PRN
  Administered 2024-05-03: 1000 mL

## 2024-05-03 MED ORDER — STERILE WATER FOR IRRIGATION IR SOLN
Status: DC | PRN
  Administered 2024-05-03: 2000 mL

## 2024-05-03 MED ORDER — PROPOFOL 1000 MG/100ML IV EMUL
INTRAVENOUS | Status: AC
  Filled 2024-05-03: qty 100

## 2024-05-03 MED ORDER — DEXAMETHASONE SODIUM PHOSPHATE 10 MG/ML IJ SOLN
10.0000 mg | Freq: Once | INTRAMUSCULAR | Status: AC
  Administered 2024-05-04: 10 mg via INTRAVENOUS
  Filled 2024-05-03: qty 1

## 2024-05-03 MED ORDER — PHENOL 1.4 % MT LIQD: 1.0000 | OROMUCOSAL | Status: DC | PRN

## 2024-05-03 MED ORDER — BUPIVACAINE-EPINEPHRINE (PF) 0.25% -1:200000 IJ SOLN
INTRAMUSCULAR | Status: AC
  Filled 2024-05-03: qty 30

## 2024-05-03 MED ORDER — MINOXIDIL 2.5 MG PO TABS
2.5000 mg | ORAL_TABLET | Freq: Every evening | ORAL | Status: DC
  Administered 2024-05-03 – 2024-05-04 (×2): 2.5 mg via ORAL
  Filled 2024-05-03 (×2): qty 1

## 2024-05-03 MED ORDER — ONDANSETRON HCL 4 MG/2ML IJ SOLN
4.0000 mg | Freq: Four times a day (QID) | INTRAMUSCULAR | Status: DC | PRN
  Administered 2024-05-03 – 2024-05-04 (×2): 4 mg via INTRAVENOUS
  Filled 2024-05-03 (×2): qty 2

## 2024-05-03 MED ORDER — METOCLOPRAMIDE HCL 5 MG/ML IJ SOLN: 5.0000 mg | Freq: Three times a day (TID) | INTRAMUSCULAR | Status: DC | PRN

## 2024-05-03 MED ORDER — PANTOPRAZOLE SODIUM 40 MG PO TBEC
40.0000 mg | DELAYED_RELEASE_TABLET | Freq: Every day | ORAL | Status: DC
  Administered 2024-05-03 – 2024-05-05 (×3): 40 mg via ORAL
  Filled 2024-05-03 (×3): qty 1

## 2024-05-03 MED ORDER — ONDANSETRON HCL 4 MG/2ML IJ SOLN
INTRAMUSCULAR | Status: DC | PRN
  Administered 2024-05-03: 4 mg via INTRAVENOUS

## 2024-05-03 MED ORDER — MIDAZOLAM HCL 2 MG/2ML IJ SOLN
1.0000 mg | INTRAMUSCULAR | Status: DC
  Administered 2024-05-03: 2 mg via INTRAVENOUS
  Filled 2024-05-03: qty 2

## 2024-05-03 SURGICAL SUPPLY — 53 items
BAG COUNTER SPONGE SURGICOUNT (BAG) IMPLANT
BAG ZIPLOCK 12X15 (MISCELLANEOUS) ×2 IMPLANT
BENZOIN TINCTURE PRP APPL 2/3 (GAUZE/BANDAGES/DRESSINGS) IMPLANT
BLADE SAG 18X100X1.27 (BLADE) ×2 IMPLANT
BNDG ELASTIC 6INX 5YD STR LF (GAUZE/BANDAGES/DRESSINGS) ×4 IMPLANT
BOWL SMART MIX CTS (DISPOSABLE) IMPLANT
CEMENT BONE R 1X40 (Cement) IMPLANT
COMPONET TIB PS KNEE D 0D LT (Joint) IMPLANT
COOLER ICEMAN CLASSIC (MISCELLANEOUS) ×2 IMPLANT
COVER SURGICAL LIGHT HANDLE (MISCELLANEOUS) ×2 IMPLANT
CUFF TRNQT CYL 34X4.125X (TOURNIQUET CUFF) ×2 IMPLANT
DRAPE U-SHAPE 47X51 STRL (DRAPES) ×2 IMPLANT
DRSG XEROFORM 1X8 (GAUZE/BANDAGES/DRESSINGS) IMPLANT
DURAPREP 26ML APPLICATOR (WOUND CARE) ×2 IMPLANT
ELECT BLADE TIP CTD 4 INCH (ELECTRODE) ×2 IMPLANT
ELECT PENCIL ROCKER SW 15FT (MISCELLANEOUS) ×2 IMPLANT
ELECT REM PT RETURN 15FT ADLT (MISCELLANEOUS) ×2 IMPLANT
FEMORAL KNEE COMP SZ 8STD LT (Knees) IMPLANT
GAUZE PAD ABD 8X10 STRL (GAUZE/BANDAGES/DRESSINGS) ×4 IMPLANT
GAUZE SPONGE 4X4 12PLY STRL (GAUZE/BANDAGES/DRESSINGS) ×2 IMPLANT
GAUZE XEROFORM 1X8 LF (GAUZE/BANDAGES/DRESSINGS) IMPLANT
GLOVE BIO SURGEON STRL SZ7.5 (GLOVE) ×2 IMPLANT
GLOVE BIOGEL PI IND STRL 8 (GLOVE) ×4 IMPLANT
GLOVE ECLIPSE 8.0 STRL XLNG CF (GLOVE) ×2 IMPLANT
GOWN STRL REUS W/ TWL XL LVL3 (GOWN DISPOSABLE) ×4 IMPLANT
GUIDE TASP BOTTOM PS CD LT +0 (INSTRUMENTS) IMPLANT
HOLDER FOLEY CATH W/STRAP (MISCELLANEOUS) IMPLANT
IMMOBILIZER KNEE 20 THIGH 36 (SOFTGOODS) ×2 IMPLANT
KIT TURNOVER KIT A (KITS) ×2 IMPLANT
LINER TIB PS CD/8-9 12 LT (Liner) IMPLANT
MANIFOLD NEPTUNE II (INSTRUMENTS) ×2 IMPLANT
NS IRRIG 1000ML POUR BTL (IV SOLUTION) ×2 IMPLANT
PACK TOTAL KNEE CUSTOM (KITS) ×2 IMPLANT
PAD COLD SHLDR WRAP-ON (PAD) ×2 IMPLANT
PADDING CAST COTTON 6X4 STRL (CAST SUPPLIES) ×4 IMPLANT
PIN DRILL HDLS TROCAR 75 4PK (PIN) IMPLANT
PROTECTOR NERVE ULNAR (MISCELLANEOUS) ×2 IMPLANT
SCREW FEMALE HEX FIX 25X2.5 (ORTHOPEDIC DISPOSABLE SUPPLIES) IMPLANT
SET HNDPC FAN SPRY TIP SCT (DISPOSABLE) ×2 IMPLANT
SET PAD KNEE POSITIONER (MISCELLANEOUS) ×2 IMPLANT
SPIKE FLUID TRANSFER (MISCELLANEOUS) IMPLANT
STAPLER SKIN PROX 35W (STAPLE) IMPLANT
STEM POLY PAT PLY 32M KNEE (Knees) IMPLANT
STRIP CLOSURE SKIN 1/2X4 (GAUZE/BANDAGES/DRESSINGS) IMPLANT
SUT MNCRL AB 4-0 PS2 18 (SUTURE) IMPLANT
SUT VIC AB 0 CT1 36 (SUTURE) ×2 IMPLANT
SUT VIC AB 1 CT1 36 (SUTURE) ×4 IMPLANT
SUT VIC AB 2-0 CT1 TAPERPNT 27 (SUTURE) ×4 IMPLANT
TOWEL GREEN STERILE FF (TOWEL DISPOSABLE) ×2 IMPLANT
TRAY FOLEY MTR SLVR 14FR STAT (SET/KITS/TRAYS/PACK) IMPLANT
TRAY FOLEY MTR SLVR 16FR STAT (SET/KITS/TRAYS/PACK) IMPLANT
WATER STERILE IRR 1000ML POUR (IV SOLUTION) ×4 IMPLANT
YANKAUER SUCT BULB TIP NO VENT (SUCTIONS) ×2 IMPLANT

## 2024-05-03 NOTE — Anesthesia Postprocedure Evaluation (Signed)
 Anesthesia Post Note  Patient: Laurie Solomon  Procedure(s) Performed: ARTHROPLASTY, KNEE, TOTAL (Left: Knee)     Patient location during evaluation: PACU Anesthesia Type: MAC and Spinal Level of consciousness: awake Pain management: pain level controlled Vital Signs Assessment: post-procedure vital signs reviewed and stable Respiratory status: spontaneous breathing, respiratory function stable and nonlabored ventilation Cardiovascular status: blood pressure returned to baseline and stable Postop Assessment: no headache, no backache and no apparent nausea or vomiting Anesthetic complications: no   No notable events documented.  Last Vitals:  Vitals:   05/03/24 1115 05/03/24 1130  BP: 112/69 116/74  Pulse: 83 74  Resp: 16 17  Temp: (!) 36.4 C   SpO2: 100% 100%    Last Pain:  Vitals:   05/03/24 1130  TempSrc:   PainSc: 0-No pain                 [name]

## 2024-05-03 NOTE — Transfer of Care (Signed)
 Immediate Anesthesia Transfer of Care Note  Patient: Laurie Solomon  Procedure(s) Performed: ARTHROPLASTY, KNEE, TOTAL (Left: Knee)  Patient Location: PACU  Anesthesia Type:Spinal  Level of Consciousness: awake, alert , and oriented  Airway & Oxygen Therapy: Patient Spontanous Breathing  Post-op Assessment: Report given to RN and Post -op Vital signs reviewed and stable  Post vital signs: Reviewed and stable  Last Vitals:  Vitals Value Taken Time  BP 106/66 05/03/24 10:15  Temp    Pulse 74 05/03/24 10:16  Resp 16 05/03/24 10:16  SpO2 100 % 05/03/24 10:16  Vitals shown include unfiled device data.  Last Pain:  Vitals:   05/03/24 0653  TempSrc: Oral  PainSc: 0-No pain         Complications: No notable events documented.

## 2024-05-03 NOTE — Anesthesia Procedure Notes (Signed)
 Spinal  Patient location during procedure: OR Start time: 05/03/2024 8:32 AM End time: 05/03/2024 8:35 AM Reason for block: surgical anesthesia Staffing Performed: resident/CRNA  Resident/CRNA: Therisa Doyal CROME, CRNA Performed by: Therisa Doyal CROME, CRNA Authorized by: Peggye Delon Brunswick, MD   Preanesthetic Checklist Completed: patient identified, IV checked, site marked, risks and benefits discussed, surgical consent, monitors and equipment checked, pre-op evaluation and timeout performed Spinal Block Patient position: sitting Prep: DuraPrep and site prepped and draped Patient monitoring: heart rate, cardiac monitor, continuous pulse ox and blood pressure Approach: midline Location: L3-4 Injection technique: single-shot Needle Needle type: Pencan  Needle gauge: 24 G Needle length: 10 cm Assessment Sensory level: T6 Events: CSF return Additional Notes Kit expiration date 05/06/2025 and lot #[id number] Clear free flow CSF, negative heme, negative paresthesia Tolerated well and returned to supine position

## 2024-05-03 NOTE — Anesthesia Procedure Notes (Signed)
 Anesthesia Regional Block: Adductor canal block   Pre-Anesthetic Checklist: , timeout performed,  Correct Patient, Correct Site, Correct Laterality,  Correct Procedure, Correct Position, site marked,  Risks and benefits discussed,  Surgical consent,  Pre-op evaluation,  At surgeon's request and post-op pain management  Laterality: Left  Prep: chloraprep       Needles:  Injection technique: Single-shot  Needle Type: Echogenic Stimulator Needle     Needle Length: 9cm  Needle Gauge: 21     Additional Needles:   Procedures:,,,, ultrasound used (permanent image in chart),,    Narrative:  Start time: 05/03/2024 7:55 AM End time: 05/03/2024 7:59 AM Injection made incrementally with aspirations every 5 mL.  Performed by: Personally  Anesthesiologist: Peggye Delon Brunswick, MD  Additional Notes: Discussed risks and benefits of nerve block including, but not limited to, prolonged and/or permanent nerve injury involving sensory and/or motor function. Monitors were applied and a time-out was performed. The nerve and associated structures were visualized under ultrasound guidance. After negative aspiration, local anesthetic was slowly injected around the nerve. There was no evidence of high pressure during the procedure. There were no paresthesias. VSS remained stable and the patient tolerated the procedure well.

## 2024-05-03 NOTE — Op Note (Signed)
 Operative Note  Date of operation: 05/03/2024 Preoperative diagnosis: Left knee primary osteoarthritis Postoperative diagnosis: Same  Procedure: Left cemented total knee arthroplasty  Implants: Biomet/Zimmer persona cemented knee system with size 8 CR standard left femur, size D left tibial tray, 12 mm thickness left medial congruent polythene insert, 32 mm patella button  Surgeon: Lonni GRADE. Vernetta, MD Assistant: Tory Gaskins, PA-C  Anesthesia: #1 left lower extremity adductor canal block, #2 spinal, #3 local Tourniquet time: Under 1 hour EBL: Less than 50 cc Complications: None Antibiotics: IV vancomycin  Indications: Laurie Solomon is a 53 year old female who unfortunately has debilitating end-stage arthritis involving her left knee.  This is well-documented clinical exam and x-ray findings showing bone-on-bone wear of that knee.  She has tried and failed conservative treatment for over a year and at this point her left knee pain is daily and it is detrimentally affecting her mobility, her quality of life, and her activities of daily living to the point she does wish to proceed with a knee replacement and we agree with this as well.  We did discuss the risks of acute blood loss anemia, nerve and vessel injury, fracture, infection, DVT, implant failure and wound healing issues.  She understands that our goals are hopefully decreased pain, improved mobility, and improved quality of life.  Procedure description: After informed consent was obtained and the appropriate left knee was marked, anesthesia obtained a left lower extremity adductor canal block in the holding room.  The patient was then brought to the operating room and set up on the operative table where spinal anesthesia was obtained.  She was then laid in supine position on the operating table and a Foley catheter was placed.  A nonsterile tractors placed around her upper left thigh and her left thigh, knee, leg and ankle were prepped and  draped with DuraPrep and sterile drapes including a sterile stockinette.  A timeout was called and she was identified as the correct patient and the correct left knee.  An Esmarch was used to wrap of the leg and the tourniquet was plated to 300 mm pressure.  With the knee extended a direct midline incision was made over the patella and carried proximally and distally.  Dissection was carried down to the knee joint and a medial parapatellar arthrotomy was made finding a moderate joint effusion.  With the knee in a flexed position there was complete cartilage wear of the medial compartment of her knee in the patellofemoral joint.  We remove remnants of the ACL as well as medial lateral meniscus.  Next using an intramedullary cutting guide for making our proximal tibia cut we corrected for varus and valgus and a 7 degree slope.  We made this cut to take 2 mm off the low side.  We made the cut without difficulty.  We then used a intramedullary based cutting guide through the notch of the femur for our distal femur cut for a left knee setting this at 5 degrees externally rotated and a 10 mm distal femoral cut.  We brought the knee back down to full extension and with a 10 mm extension block and achieve full extension.  We then impacted the femur and put a femoral sizing guide based off the epicondylar axis and based off of this we chose a size 8 femur.  We put a 4-in-1 cutting block for a size 8 femur and made our anterior and posterior cuts followed our chamfer cuts.  We then backed the tibia and chose a  size D left tibial tray for coverage over the tibial plateau setting the rotation of the tibial tubercle and the femur.  We did our drill hole and keel punch off of this.  Next we trialed our size D left tibial tray followed by our size 8 left CR standard femur.  We went up to a 12 mm thickness left medial congruent polythene insert we are pleased with range of motion and stability without insert.  We then made a  patella cut and drilled 3 holes for a size 32 patella button.  Again with all trial instrumentation in the knee we are pleased with range of motion and stability.  All trial instrumentation was then removed and we irrigated the knee with normal saline solution.  Next we placed Marcaine with epinephrine around the arthrotomy.  Finally we we mixed our cement and cemented our Biomet/Zimmer persona tibial tray for a left knee size D followed by cementing our size 8 left CR standard femur.  We placed our 12 mm thickness left medial congruent polythene insert and cemented our size 32 patella button.  We then held the knee fully extended and compressed while the cemented hardened.  Once the cemented hardened the tourniquet was let down and hemostasis was obtained electrocautery.  The arthrotomy was closed with interrupted #1 Vicryl suture followed by 0 Vicryl goes deep tissue and 2-0 Vicryl to close subcutaneous tissue.  The skin was closed with staples.  Well-padded sterile dressing was applied.  The patient was taken to the recovery room.  Tory Gaskins, PA-C did assist during the entire case and beginning to end and his assistance was crucial and medically necessary for soft tissue management and retraction, helping guide implant placement and a layered closure of the wound.

## 2024-05-03 NOTE — Anesthesia Procedure Notes (Signed)
 Date/Time: 05/03/2024 10:32 AM  Performed by: Therisa Doyal CROME, CRNAOxygen Delivery Method: Simple face mask

## 2024-05-03 NOTE — Interval H&P Note (Signed)
 History and Physical Interval Note: The patient understands that she is here today for a left total knee replacement to treat her significant left knee pain and arthritis.  There has been no acute or interval change in her medical status.  The risks and benefits of surgery have been discussed in detail and informed consent has been obtained.  The left operative knee has been marked.  05/03/2024 7:00 AM  Laurie Solomon  has presented today for surgery, with the diagnosis of Osteoarthritis Left Knee.  The various methods of treatment have been discussed with the patient and family. After consideration of risks, benefits and other options for treatment, the patient has consented to  Procedure(s): ARTHROPLASTY, KNEE, TOTAL (Left) as a surgical intervention.  The patient's history has been reviewed, patient examined, no change in status, stable for surgery.  I have reviewed the patient's chart and labs.  Questions were answered to the patient's satisfaction.     [name]

## 2024-05-03 NOTE — Plan of Care (Signed)

## 2024-05-03 NOTE — Discharge Instructions (Signed)
 Per [facility] clinic policy, our goal is ensure optimal postoperative pain control with a multimodal pain management strategy. For all [facility] patients, our goal is to wean post-operative narcotic medications by 6 weeks post-operatively. If this is not possible due to utilization of pain medication prior to surgery, your [facility] doctor will support your acute post-operative pain control for the first 6 weeks postoperatively, with a plan to transition you back to your primary pain team following that. Laurie Solomon will work to ensure a Therapist, occupational.  INSTRUCTIONS AFTER JOINT REPLACEMENT   Remove items at home which could result in a fall. This includes throw rugs or furniture in walking pathways ICE to the affected joint every three hours while awake for 30 minutes at a time, for at least the first 3-5 days, and then as needed for pain and swelling.  Continue to use ice for pain and swelling. You may notice swelling that will progress down to the foot and ankle.  This is normal after surgery.  Elevate your leg when you are not up walking on it.   Continue to use the breathing machine you got in the hospital (incentive spirometer) which will help keep your temperature down.  It is common for your temperature to cycle up and down following surgery, especially at night when you are not up moving around and exerting yourself.  The breathing machine keeps your lungs expanded and your temperature down.   DIET:  As you were doing prior to hospitalization, we recommend a well-balanced diet.  DRESSING / WOUND CARE / SHOWERING  Keep the surgical dressing until follow up.  The dressing is water proof, so you can shower without any extra covering.  IF THE DRESSING FALLS OFF or the wound gets wet inside, change the dressing with sterile gauze.  Please use good hand washing techniques before changing the dressing.  Do not use any lotions or creams on the incision until instructed by your surgeon.     ACTIVITY  Increase activity slowly as tolerated, but follow the weight bearing instructions below.   No driving for 6 weeks or until further direction given by your physician.  You cannot drive while taking narcotics.  No lifting or carrying greater than 10 lbs. until further directed by your surgeon. Avoid periods of inactivity such as sitting longer than an hour when not asleep. This helps prevent blood clots.  You may return to work once you are authorized by your doctor.     WEIGHT BEARING   Weight bearing as tolerated with assist device (walker, cane, etc) as directed, use it as long as suggested by your surgeon or therapist, typically at least 4-6 weeks.   EXERCISES  Results after joint replacement surgery are often greatly improved when you follow the exercise, range of motion and muscle strengthening exercises prescribed by your doctor. Safety measures are also important to protect the joint from further injury. Any time any of these exercises cause you to have increased pain or swelling, decrease what you are doing until you are comfortable again and then slowly increase them. If you have problems or questions, call your caregiver or physical therapist for advice.   Rehabilitation is important following a joint replacement. After just a few days of immobilization, the muscles of the leg can become weakened and shrink (atrophy).  These exercises are designed to build up the tone and strength of the thigh and leg muscles and to improve motion. Often times heat used for twenty to thirty minutes before  working out will loosen up your tissues and help with improving the range of motion but do not use heat for the first two weeks following surgery (sometimes heat can increase post-operative swelling).   These exercises can be done on a training (exercise) mat, on the floor, on a table or on a bed. Use whatever works the best and is most comfortable for you.    Use music or television  while you are exercising so that the exercises are a pleasant break in your day. This will make your life better with the exercises acting as a break in your routine that you can look forward to.   Perform all exercises about fifteen times, three times per day or as directed.  You should exercise both the operative leg and the other leg as well.  Exercises include:   Quad Sets - Tighten up the muscle on the front of the thigh (Quad) and hold for 5-10 seconds.   Straight Leg Raises - With your knee straight (if you were given a brace, keep it on), lift the leg to 60 degrees, hold for 3 seconds, and slowly lower the leg.  Perform this exercise against resistance later as your leg gets stronger.  Leg Slides: Lying on your back, slowly slide your foot toward your buttocks, bending your knee up off the floor (only go as far as is comfortable). Then slowly slide your foot back down until your leg is flat on the floor again.  Angel Wings: Lying on your back spread your legs to the side as far apart as you can without causing discomfort.  Hamstring Strength:  Lying on your back, push your heel against the floor with your leg straight by tightening up the muscles of your buttocks.  Repeat, but this time bend your knee to a comfortable angle, and push your heel against the floor.  You may put a pillow under the heel to make it more comfortable if necessary.   A rehabilitation program following joint replacement surgery can speed recovery and prevent re-injury in the future due to weakened muscles. Contact your doctor or a physical therapist for more information on knee rehabilitation.    CONSTIPATION  Constipation is defined medically as fewer than three stools per week and severe constipation as less than one stool per week.  Even if you have a regular bowel pattern at home, your normal regimen is likely to be disrupted due to multiple reasons following surgery.  Combination of anesthesia, postoperative  narcotics, change in appetite and fluid intake all can affect your bowels.   YOU MUST use at least one of the following options; they are listed in order of increasing strength to get the job done.  They are all available over the counter, and you may need to use some, POSSIBLY even all of these options:    Drink plenty of fluids (prune juice may be helpful) and high fiber foods Colace 100 mg by mouth twice a day  Senokot for constipation as directed and as needed Dulcolax (bisacodyl), take with full glass of water  Miralax (polyethylene glycol) once or twice a day as needed.  If you have tried all these things and are unable to have a bowel movement in the first 3-4 days after surgery call either your surgeon or your primary doctor.    If you experience loose stools or diarrhea, hold the medications until you stool forms back up.  If your symptoms do not get better within 1 week  or if they get worse, check with your doctor.  If you experience "the worst abdominal pain ever" or develop nausea or vomiting, please contact the office immediately for further recommendations for treatment.   ITCHING:  If you experience itching with your medications, try taking only a single pain pill, or even half a pain pill at a time.  You can also use Benadryl over the counter for itching or also to help with sleep.   TED HOSE STOCKINGS:  Use stockings on both legs until for at least 2 weeks or as directed by physician office. They may be removed at night for sleeping.  MEDICATIONS:  See your medication summary on the "After Visit Summary" that nursing will review with you.  You may have some home medications which will be placed on hold until you complete the course of blood thinner medication.  It is important for you to complete the blood thinner medication as prescribed.  PRECAUTIONS:  If you experience chest pain or shortness of breath - call 911 immediately for transfer to the hospital emergency department.    If you develop a fever greater that 101 F, purulent drainage from wound, increased redness or drainage from wound, foul odor from the wound/dressing, or calf pain - CONTACT YOUR SURGEON.                                                   FOLLOW-UP APPOINTMENTS:  If you do not already have a post-op appointment, please call the office for an appointment to be seen by your surgeon.  Guidelines for how soon to be seen are listed in your "After Visit Summary", but are typically between 1-4 weeks after surgery.  OTHER INSTRUCTIONS:   Knee Replacement:  Do not place pillow under knee, focus on keeping the knee straight while resting. CPM instructions: 0-90 degrees, 2 hours in the morning, 2 hours in the afternoon, and 2 hours in the evening. Place foam block, curve side up under heel at all times except when in CPM or when walking.  DO NOT modify, tear, cut, or change the foam block in any way.  POST-OPERATIVE OPIOID TAPER INSTRUCTIONS: It is important to wean off of your opioid medication as soon as possible. If you do not need pain medication after your surgery it is ok to stop day one. Opioids include: Codeine, Hydrocodone(Norco, Vicodin), Oxycodone(Percocet, oxycontin) and hydromorphone amongst others.  Long term and even short term use of opiods can cause: Increased pain response Dependence Constipation Depression Respiratory depression And more.  Withdrawal symptoms can include Flu like symptoms Nausea, vomiting And more Techniques to manage these symptoms Hydrate well Eat regular healthy meals Stay active Use relaxation techniques(deep breathing, meditating, yoga) Do Not substitute Alcohol to help with tapering If you have been on opioids for less than two weeks and do not have pain than it is ok to stop all together.  Plan to wean off of opioids This plan should start within one week post op of your joint replacement. Maintain the same interval or time between taking each dose  and first decrease the dose.  Cut the total daily intake of opioids by one tablet each day Next start to increase the time between doses. The last dose that should be eliminated is the evening dose.   MAKE SURE YOU:  Understand these instructions.  Get help right away if you are not doing well or get worse.    Thank you for letting us be a part of your medical care team.  It is a privilege we respect greatly.  We hope these instructions will help you stay on track for a fast and full recovery!      Dental Antibiotics:  In most cases prophylactic antibiotics for Dental procdeures after total joint surgery are not necessary.  Exceptions are as follows:  1. History of prior total joint infection  2. Severely immunocompromised (Organ Transplant, cancer chemotherapy, Rheumatoid biologic meds such as Humera)  3. Poorly controlled diabetes (A1C &gt; 8.0, blood glucose over 200)  If you have one of these conditions, contact your surgeon for an antibiotic prescription, prior to your dental procedure.

## 2024-05-04 DIAGNOSIS — M1712 Unilateral primary osteoarthritis, left knee: Secondary | ICD-10-CM | POA: Diagnosis not present

## 2024-05-04 MED ORDER — PROMETHAZINE HCL 25 MG PO TABS: 25.0000 mg | ORAL_TABLET | Freq: Four times a day (QID) | ORAL | 0 refills | Status: DC | PRN

## 2024-05-04 MED ORDER — OXYCODONE HCL 5 MG PO TABS: 5.0000 mg | ORAL_TABLET | ORAL | 0 refills | Status: DC | PRN

## 2024-05-04 MED ORDER — PROMETHAZINE HCL 12.5 MG PO TABS: 12.5000 mg | ORAL_TABLET | Freq: Three times a day (TID) | ORAL | 1 refills | Status: DC | PRN

## 2024-05-04 MED ORDER — ASPIRIN 81 MG PO CHEW: 81.0000 mg | CHEWABLE_TABLET | Freq: Two times a day (BID) | ORAL | 0 refills | Status: DC

## 2024-05-04 MED ORDER — METHOCARBAMOL 500 MG PO TABS: 500.0000 mg | ORAL_TABLET | Freq: Four times a day (QID) | ORAL | 1 refills | Status: DC | PRN

## 2024-05-04 NOTE — Progress Notes (Signed)
 Subjective: 1 Day Post-Op Procedure(s) (LRB): ARTHROPLASTY, KNEE, TOTAL (Left) Patient reports pain as moderate.  Significant nausea when up this am.  Objective: Vital signs in last 24 hours: Temp:  [97.6 F (36.4 C)-97.9 F (36.6 C)] 97.7 F (36.5 C) (06/28 0934) Pulse Rate:  [83-96] 93 (06/28 0934) Resp:  [14-19] 19 (06/28 0934) BP: (134-146)/(60-85) 136/71 (06/28 0934) SpO2:  [97 %-100 %] 99 % (06/28 0934)  Intake/Output from previous day: 06/27 0701 - 06/28 0700 In: 2625.9 [P.O.:600; I.V.:1807.6; IV Piggyback:218.3] Out: 3975 [Urine:3950; Blood:25] Intake/Output this shift: No intake/output data recorded.  No results for input(s): HGB in the last 72 hours. No results for input(s): WBC, RBC, HCT, PLT in the last 72 hours. No results for input(s): NA, K, CL, CO2, BUN, CREATININE, GLUCOSE, CALCIUM in the last 72 hours. No results for input(s): LABPT, INR in the last 72 hours.  Sensation intact distally Intact pulses distally Dorsiflexion/Plantar flexion intact Incision: scant drainage Compartment soft   Assessment/Plan: 1 Day Post-Op Procedure(s) (LRB): ARTHROPLASTY, KNEE, TOTAL (Left) Up with therapy Discharge home with home health this afternoon if improves.      Laurie Solomon 05/04/2024, 12:04 PM

## 2024-05-04 NOTE — Evaluation (Signed)
 Physical Therapy Evaluation Patient Details Name: Laurie Solomon MRN: [id number] DOB: 1970-12-16 Today's Date: 05/04/2024  History of Present Illness  Pt s/p L TKR and with hx of PTSD  Clinical Impression  Pt s/p L TKR and presents with functional mobility limitations 2* decreased L LE strength/ROM and post op pain/nausea.  Pt should progress to dc home with family assist and HHPT follow up.        If plan is discharge home, recommend the following: A little help with walking and/or transfers;A little help with bathing/dressing/bathroom;Assistance with cooking/housework;Assist for transportation;Help with stairs or ramp for entrance   Can travel by private vehicle        Equipment Recommendations None recommended by PT  Recommendations for Other Services       Functional Status Assessment Patient has had a recent decline in their functional status and demonstrates the ability to make significant improvements in function in a reasonable and predictable amount of time.     Precautions / Restrictions Precautions Precautions: Knee;Fall Restrictions Weight Bearing Restrictions Per Provider Order: Yes LLE Weight Bearing Per Provider Order: Weight bearing as tolerated      Mobility  Bed Mobility Overal bed mobility: Needs Assistance Bed Mobility: Supine to Sit     Supine to sit: Min assist     General bed mobility comments: Increased time with min assist to manage L LE    Transfers Overall transfer level: Needs assistance Equipment used: Rolling walker (2 wheels) Transfers: Sit to/from Stand Sit to Stand: Min assist           General transfer comment: cues for LE management and use of UEs to self assist.  Physical assist to bring wt up and fwd and to balance in initial standing with RW    Ambulation/Gait Ambulation/Gait assistance: Min assist Gait Distance (Feet): 58 Feet Assistive device: Rolling walker (2 wheels) Gait Pattern/deviations: Step-to pattern,  Decreased step length - right, Decreased step length - left, Shuffle, Trunk flexed Gait velocity: decr     General Gait Details: cues for sequence, posture and positon from RW; distance ltd by nausea  Stairs            Wheelchair Mobility     Tilt Bed    Modified Rankin (Stroke Patients Only)       Balance Overall balance assessment: Needs assistance Sitting-balance support: No upper extremity supported, Feet supported Sitting balance-Leahy Scale: Good     Standing balance support: Bilateral upper extremity supported Standing balance-Leahy Scale: Poor                               Pertinent Vitals/Pain Pain Assessment Pain Assessment: 0-10 Pain Score: 5  Pain Location: L knee Pain Descriptors / Indicators: Aching, Sore Pain Intervention(s): Limited activity within patient's tolerance, Monitored during session, Premedicated before session, Ice applied    Home Living Family/patient expects to be discharged to:: Private residence Living Arrangements: Spouse/significant other;Children Available Help at Discharge: Family Type of Home: House Home Access: Stairs to enter Entrance Stairs-Rails: None Entrance Stairs-Number of Steps: 3   Home Layout: One level Home Equipment: Agricultural consultant (2 wheels);Cane - single point;Toilet riser      Prior Function Prior Level of Function : Independent/Modified Independent                     Extremity/Trunk Assessment   Upper Extremity Assessment Upper Extremity Assessment: Overall WFL for tasks assessed  Lower Extremity Assessment Lower Extremity Assessment: LLE deficits/detail LLE Deficits / Details: 2+/5 quads with AAROM at knee -5 - 30  - pain limited with muscle guarding    Cervical / Trunk Assessment Cervical / Trunk Assessment: Normal  Communication   Communication Communication: No apparent difficulties    Cognition Arousal: Alert Behavior During Therapy: WFL for tasks  assessed/performed   PT - Cognitive impairments: No apparent impairments                         Following commands: Intact       Cueing Cueing Techniques: Verbal cues     General Comments      Exercises Total Joint Exercises Ankle Circles/Pumps: AROM, Left, 15 reps, Supine Quad Sets: AROM, Both, 10 reps, Supine Heel Slides: AAROM, Left, 15 reps, Supine Straight Leg Raises: AAROM, Left, 10 reps, Supine   Assessment/Plan    PT Assessment Patient needs continued PT services  PT Problem List Decreased strength;Decreased range of motion;Decreased activity tolerance;Decreased balance;Decreased mobility;Decreased knowledge of use of DME;Pain       PT Treatment Interventions DME instruction;Gait training;Stair training;Functional mobility training;Therapeutic activities;Therapeutic exercise;Patient/family education    PT Goals (Current goals can be found in the Care Plan section)  Acute Rehab PT Goals Patient Stated Goal: REgain IND PT Goal Formulation: With patient Time For Goal Achievement: 05/10/24 Potential to Achieve Goals: Good    Frequency 7X/week     Co-evaluation               AM-PAC PT 6 Clicks Mobility  Outcome Measure Help needed turning from your back to your side while in a flat bed without using bedrails?: A Little Help needed moving from lying on your back to sitting on the side of a flat bed without using bedrails?: A Little Help needed moving to and from a bed to a chair (including a wheelchair)?: A Little Help needed standing up from a chair using your arms (e.g., wheelchair or bedside chair)?: A Little Help needed to walk in hospital room?: A Little Help needed climbing 3-5 steps with a railing? : A Lot 6 Click Score: 17    End of Session Equipment Utilized During Treatment: Gait belt;Left knee immobilizer Activity Tolerance: Other (comment);Patient limited by fatigue (nausea) Patient left: in chair;with call bell/phone within  reach;with chair alarm set Nurse Communication: Mobility status PT Visit Diagnosis: Difficulty in walking, not elsewhere classified (R26.2)    Time: 8886-8853 PT Time Calculation (min) (ACUTE ONLY): 33 min   Charges:   PT Evaluation $PT Eval Low Complexity: 1 Low PT Treatments $Therapeutic Exercise: 8-22 mins PT General Charges $$ ACUTE PT VISIT: 1 Visit         [facility] PT Acute Rehabilitation Services Office [phone]   Nyari Olsson 05/04/2024, 4:05 PM

## 2024-05-04 NOTE — Progress Notes (Signed)
 Physical Therapy Treatment Patient Details Name: Laurie Solomon MRN: [id number] DOB: 1971-07-31 Today's Date: 05/04/2024   History of Present Illness Pt s/p L TKR and with hx of PTSD    PT Comments  Pt continues very cooperative but fatigues easily and with c/o wooziness with activity.  Pt up to ambulate limited distance in hall, negotiated stairs and up to bathroom for toileting with hand hygiene standing at sink.  Pt hopeful for dc home tomorrow.    If plan is discharge home, recommend the following: A little help with walking and/or transfers;A little help with bathing/dressing/bathroom;Assistance with cooking/housework;Assist for transportation;Help with stairs or ramp for entrance   Can travel by private vehicle        Equipment Recommendations  None recommended by PT    Recommendations for Other Services       Precautions / Restrictions Precautions Precautions: Knee;Fall Restrictions Weight Bearing Restrictions Per Provider Order: Yes LLE Weight Bearing Per Provider Order: Weight bearing as tolerated     Mobility  Bed Mobility Overal bed mobility: Needs Assistance Bed Mobility: Sit to Supine     Supine to sit: Min assist Sit to supine: Min assist   General bed mobility comments: Increased time with min assist to manage L LE    Transfers Overall transfer level: Needs assistance Equipment used: Rolling walker (2 wheels) Transfers: Sit to/from Stand Sit to Stand: Min assist, Contact guard assist           General transfer comment: Steady assist with cues for LE management and use of UEs to self assist.  To/from chair and BSC and to EOB    Ambulation/Gait Ambulation/Gait assistance: Min assist, Contact guard assist Gait Distance (Feet): 34 Feet (and 18' twice to/from bathroom) Assistive device: Rolling walker (2 wheels) Gait Pattern/deviations: Step-to pattern, Decreased step length - right, Decreased step length - left, Shuffle, Trunk flexed Gait  velocity: decr     General Gait Details: cues for sequence, posture and positon from RW; distance ltd by fatigue   Stairs Stairs: Yes Stairs assistance: Min assist Stair Management: No rails, Step to pattern, Backwards, With walker Number of Stairs: 2 General stair comments: cues for sequence and foot/RW placement   Wheelchair Mobility     Tilt Bed    Modified Rankin (Stroke Patients Only)       Balance Overall balance assessment: Needs assistance Sitting-balance support: No upper extremity supported, Feet supported Sitting balance-Leahy Scale: Good     Standing balance support: No upper extremity supported, During functional activity Standing balance-Leahy Scale: Fair                              Hotel manager: No apparent difficulties  Cognition Arousal: Alert Behavior During Therapy: WFL for tasks assessed/performed   PT - Cognitive impairments: No apparent impairments                         Following commands: Intact      Cueing Cueing Techniques: Verbal cues  Exercises Total Joint Exercises Ankle Circles/Pumps: AROM, Left, 15 reps, Supine Quad Sets: AROM, Both, 10 reps, Supine Heel Slides: AAROM, Left, 15 reps, Supine Straight Leg Raises: AAROM, Left, 10 reps, Supine    General Comments        Pertinent Vitals/Pain Pain Assessment Pain Assessment: 0-10 Pain Score: 5  Pain Location: L knee Pain Descriptors / Indicators: Aching, Sore Pain Intervention(s): Limited activity  within patient's tolerance, Monitored during session, Premedicated before session, Ice applied    Home Living Family/patient expects to be discharged to:: Private residence Living Arrangements: Spouse/significant other;Children Available Help at Discharge: Family Type of Home: House Home Access: Stairs to enter Entrance Stairs-Rails: None Entrance Stairs-Number of Steps: 3   Home Layout: One level Home Equipment: Clinical biochemist (2 wheels);Cane - single point;Toilet riser      Prior Function            PT Goals (current goals can now be found in the care plan section) Acute Rehab PT Goals Patient Stated Goal: REgain IND PT Goal Formulation: With patient Time For Goal Achievement: 05/10/24 Potential to Achieve Goals: Good Progress towards PT goals: Progressing toward goals    Frequency    7X/week      PT Plan      Co-evaluation              AM-PAC PT 6 Clicks Mobility   Outcome Measure  Help needed turning from your back to your side while in a flat bed without using bedrails?: A Little Help needed moving from lying on your back to sitting on the side of a flat bed without using bedrails?: A Little Help needed moving to and from a bed to a chair (including a wheelchair)?: A Little Help needed standing up from a chair using your arms (e.g., wheelchair or bedside chair)?: A Little Help needed to walk in hospital room?: A Little Help needed climbing 3-5 steps with a railing? : A Little 6 Click Score: 18    End of Session Equipment Utilized During Treatment: Gait belt;Left knee immobilizer Activity Tolerance: Patient tolerated treatment well;Patient limited by fatigue Patient left: in bed;with call bell/phone within reach;with bed alarm set Nurse Communication: Mobility status PT Visit Diagnosis: Difficulty in walking, not elsewhere classified (R26.2)     Time: 1436-1510 PT Time Calculation (min) (ACUTE ONLY): 34 min  Charges:    $Gait Training: 8-22 mins $Therapeutic Exercise: 8-22 mins $Therapeutic Activity: 8-22 mins PT General Charges $$ ACUTE PT VISIT: 1 Visit                     [facility] PT Acute Rehabilitation Services Office [phone]    [facility] 05/04/2024, 4:13 PM

## 2024-05-04 NOTE — Plan of Care (Signed)

## 2024-05-05 DIAGNOSIS — M1712 Unilateral primary osteoarthritis, left knee: Secondary | ICD-10-CM | POA: Diagnosis not present

## 2024-05-05 NOTE — Plan of Care (Signed)

## 2024-05-05 NOTE — Progress Notes (Signed)
 Physical Therapy Treatment Patient Details Name: Laurie Solomon MRN: [id number] DOB: 1971-02-22 Today's Date: 05/05/2024   History of Present Illness Pt s/p L TKR and with hx of PTSD    PT Comments  Pt continues very cooperative and progressing well with mobility but limited by nausea with activity.  Pt performed HEP with written instruction provided and reviewed, performed bed mobility with sup only, reviewed KI, ambulated limited distance in hall, and negotiated stairs.  Pt hopeful for dc this am.    If plan is discharge home, recommend the following: A little help with walking and/or transfers;A little help with bathing/dressing/bathroom;Assistance with cooking/housework;Assist for transportation;Help with stairs or ramp for entrance   Can travel by private vehicle        Equipment Recommendations  None recommended by PT    Recommendations for Other Services       Precautions / Restrictions Precautions Precautions: Knee;Fall Required Braces or Orthoses: Knee Immobilizer - Left Knee Immobilizer - Left: Discontinue once straight leg raise with < 10 degree lag Restrictions Weight Bearing Restrictions Per Provider Order: No LLE Weight Bearing Per Provider Order: Weight bearing as tolerated     Mobility  Bed Mobility Overal bed mobility: Needs Assistance Bed Mobility: Supine to Sit     Supine to sit: Supervision     General bed mobility comments: Increased time with pt using KI to manage L LE    Transfers Overall transfer level: Needs assistance Equipment used: Rolling walker (2 wheels) Transfers: Sit to/from Stand Sit to Stand: Contact guard assist, Supervision           General transfer comment: Min cues for LE management and use of UEs to self assist.    Ambulation/Gait Ambulation/Gait assistance: Contact guard assist, Supervision Gait Distance (Feet): 40 Feet Assistive device: Rolling walker (2 wheels) Gait Pattern/deviations: Step-to pattern, Decreased  step length - right, Decreased step length - left, Shuffle, Trunk flexed Gait velocity: decr     General Gait Details: min cues for sequence, posture and positon from RW; distance ltd by fatigue/nausea   Stairs Stairs: Yes Stairs assistance: Min assist Stair Management: No rails, Step to pattern, Backwards, With walker Number of Stairs: 2 General stair comments: cues for sequence and foot/RW placement   Wheelchair Mobility     Tilt Bed    Modified Rankin (Stroke Patients Only)       Balance Overall balance assessment: Needs assistance Sitting-balance support: No upper extremity supported, Feet supported Sitting balance-Leahy Scale: Good     Standing balance support: No upper extremity supported, During functional activity Standing balance-Leahy Scale: Fair                              Hotel manager: No apparent difficulties  Cognition Arousal: Alert Behavior During Therapy: WFL for tasks assessed/performed   PT - Cognitive impairments: No apparent impairments                         Following commands: Intact      Cueing Cueing Techniques: Verbal cues  Exercises Total Joint Exercises Ankle Circles/Pumps: AROM, Left, 15 reps, Supine Quad Sets: AROM, Both, 10 reps, Supine Heel Slides: AAROM, Left, 15 reps, Supine Straight Leg Raises: AAROM, Left, 10 reps, Supine Goniometric ROM: AAROM L knee -5 - 30 wtih noted muscle guarding    General Comments        Pertinent Vitals/Pain Pain Assessment Pain  Assessment: 0-10 Pain Score: 5  Pain Location: L knee Pain Descriptors / Indicators: Aching, Sore Pain Intervention(s): Limited activity within patient's tolerance, Monitored during session, Premedicated before session, Ice applied    Home Living                          Prior Function            PT Goals (current goals can now be found in the care plan section) Acute Rehab PT Goals Patient  Stated Goal: REgain IND PT Goal Formulation: With patient Time For Goal Achievement: 05/10/24 Potential to Achieve Goals: Good Progress towards PT goals: Progressing toward goals    Frequency    7X/week      PT Plan      Co-evaluation              AM-PAC PT 6 Clicks Mobility   Outcome Measure  Help needed turning from your back to your side while in a flat bed without using bedrails?: A Little Help needed moving from lying on your back to sitting on the side of a flat bed without using bedrails?: A Little Help needed moving to and from a bed to a chair (including a wheelchair)?: A Little Help needed standing up from a chair using your arms (e.g., wheelchair or bedside chair)?: A Little Help needed to walk in hospital room?: A Little Help needed climbing 3-5 steps with a railing? : A Little 6 Click Score: 18    End of Session Equipment Utilized During Treatment: Gait belt;Left knee immobilizer Activity Tolerance: Patient tolerated treatment well;Patient limited by fatigue Patient left: in chair;with call bell/phone within reach;with chair alarm set Nurse Communication: Mobility status PT Visit Diagnosis: Difficulty in walking, not elsewhere classified (R26.2)     Time: 9196-9154 PT Time Calculation (min) (ACUTE ONLY): 42 min  Charges:    $Gait Training: 8-22 mins $Therapeutic Exercise: 8-22 mins $Therapeutic Activity: 8-22 mins PT General Charges $$ ACUTE PT VISIT: 1 Visit                     [facility] PT Acute Rehabilitation Services Office [phone]    [facility] 05/05/2024, 8:52 AM

## 2024-05-05 NOTE — Discharge Summary (Signed)
 Patient ID: Laurie Solomon MRN: [id number] DOB/AGE: [birth year] 53 y.o.  Admit date: 05/03/2024 Discharge date: 05/05/2024  Admission Diagnoses:  Unilateral primary osteoarthritis, left knee  Discharge Diagnoses:  Principal Problem:   Unilateral primary osteoarthritis, left knee Active Problems:   Status post total left knee replacement   Past Medical History:  Diagnosis Date   Agatston coronary artery calcium score less than 100    Allergic rhinitis    Allergy     Arthritis    Asthma    childhood   Chronic insomnia    Headache(784.0)    Hx of adenomatous polyp of colon 05/13/2021   diminutive x 1   Hypothyroidism 07/09/2015   Post traumatic stress disorder    Vitamin D  deficiency 07/09/2015    Surgeries: Procedure(s): ARTHROPLASTY, KNEE, TOTAL on 05/03/2024   Consultants (if any):   Discharged Condition: Improved  Hospital Course: Laurie Solomon is an 53 y.o. female who was admitted 05/03/2024 with a diagnosis of Unilateral primary osteoarthritis, left knee and went to the operating room on 05/03/2024 and underwent the above named procedures.    She was given perioperative antibiotics:  Anti-infectives (From admission, onward)    Start     Dose/Rate Route Frequency Ordered Stop   05/03/24 1900  vancomycin (VANCOCIN) IVPB 1000 mg/200 mL premix        1,000 mg 200 mL/hr over 60 Minutes Intravenous Every 12 hours 05/03/24 1145 05/03/24 1917   05/03/24 0630  ceFAZolin (ANCEF) IVPB 2g/100 mL premix        2 g 200 mL/hr over 30 Minutes Intravenous On call to O.R. 05/03/24 9370 05/03/24 0848   05/03/24 0629  vancomycin (VANCOCIN) IVPB 1000 mg/200 mL premix        1,000 mg 200 mL/hr over 60 Minutes Intravenous 60 min pre-op 05/03/24 0629 05/03/24 0835     .  She was given sequential compression devices, early ambulation, and appropriate chemoprophylaxis for DVT prophylaxis.  She benefited maximally from the hospital stay and there were no complications.    Recent  vital signs:  Vitals:   05/04/24 2334 05/05/24 0545  BP: (!) 141/78 130/67  Pulse: (!) 103 (!) 105  Resp: 16 15  Temp: 98.6 F (37 C) 98.5 F (36.9 C)  SpO2: 100% 100%    Recent laboratory studies:  Lab Results  Component Value Date   HGB 14.3 04/29/2024   HGB 15.1 (H) 11/15/2022   HGB 14.5 05/31/2021   Lab Results  Component Value Date   WBC 7.2 04/29/2024   PLT 331 04/29/2024   No results found for: INR Lab Results  Component Value Date   NA 134 (L) 04/29/2024   K 4.0 04/29/2024   CL 101 04/29/2024   CO2 23 04/29/2024   BUN 12 04/29/2024   CREATININE 0.75 04/29/2024   GLUCOSE 89 04/29/2024    Discharge Medications:   Allergies as of 05/05/2024   No Known Allergies      Medication List     TAKE these medications    aspirin 81 MG chewable tablet Chew 1 tablet (81 mg total) by mouth 2 (two) times daily.   buPROPion  300 MG 24 hr tablet Commonly known as: WELLBUTRIN  XL TAKE 1 TABLET BY MOUTH EVERY DAY   calcium carbonate 1500 (600 Ca) MG Tabs tablet Commonly known as: OSCAL Take 600 mg of elemental calcium by mouth 2 (two) times daily with a meal.   chlorhexidine  4 % external liquid Commonly known as: HIBICLENS Apply 15  mLs (1 Application total) topically as directed for 30 doses. Use as directed daily for 5 days every other week for 6 weeks.   clonazePAM  1 MG tablet Commonly known as: KLONOPIN  TAKE 1/2 TABLET BY MOUTH IN THE MORNING AND 1 TABLET AT BEDTIME What changed: See the new instructions.   estradiol 0.1 MG/GM vaginal cream Commonly known as: ESTRACE Place 1 g vaginally daily as needed (irritation).   IUD'S IU 1 Intra Uterine Device by Intrauterine route once.   levothyroxine  25 MCG tablet Commonly known as: SYNTHROID  TAKE 1 TABLET BY MOUTH EVERY DAY BEFORE BREAKFAST   loratadine 10 MG tablet Commonly known as: CLARITIN Take 10 mg by mouth daily.   methocarbamol 500 MG tablet Commonly known as: ROBAXIN Take 1 tablet (500 mg  total) by mouth every 6 (six) hours as needed for muscle spasms.   minoxidil  2.5 MG tablet Commonly known as: LONITEN  TAKE 1 TABLET BY MOUTH EVERY DAY   mupirocin ointment 2 % Commonly known as: BACTROBAN Place 1 Application into the nose 2 (two) times daily for 60 doses. Use as directed 2 times daily for 5 days every other week for 6 weeks.   oxyCODONE 5 MG immediate release tablet Commonly known as: Oxy IR/ROXICODONE Take 1-2 tablets (5-10 mg total) by mouth every 4 (four) hours as needed for moderate pain (pain score 4-6) (pain score 4-6).   Ozempic  (2 MG/DOSE) 8 MG/3ML Sopn Generic drug: Semaglutide  (2 MG/DOSE) Inject 2 mg into the skin once a week.   prenatal multivitamin Tabs tablet Take 1 tablet by mouth daily at 12 noon.   Probiotic Chew Chew 2 tablets by mouth daily.   promethazine  12.5 MG tablet Commonly known as: PHENERGAN  Take 1 tablet (12.5 mg total) by mouth every 8 (eight) hours as needed for nausea or vomiting.   promethazine  25 MG tablet Commonly known as: PHENERGAN  Take 1 tablet (25 mg total) by mouth every 6 (six) hours as needed for up to 7 days for nausea.   tretinoin  0.1 % cream Commonly known as: RETIN-A  Apply 1 application  topically at bedtime. What changed:  when to take this reasons to take this               Durable Medical Equipment  (From admission, onward)           Start     Ordered   05/03/24 1146  DME 3 n 1  Once        05/03/24 1145   05/03/24 1146  DME Walker rolling  Once       Question Answer Comment  Walker: With 5 Inch Wheels   Patient needs a walker to treat with the following condition Status post total left knee replacement      05/03/24 1145            Diagnostic Studies: DG Knee Left Port Result Date: 05/03/2024 CLINICAL DATA:  Status post left knee replacement. EXAM: PORTABLE LEFT KNEE - 1-2 VIEW COMPARISON:  None Available. FINDINGS: Left knee arthroplasty in expected alignment. No periprosthetic  lucency or fracture. There has been patellar resurfacing. Recent postsurgical change includes air and edema in the soft tissues and joint space. Anterior skin staples in place. IMPRESSION: Left knee arthroplasty without immediate postoperative complication. Electronically Signed   By: Andrea Gasman M.D.   On: 05/03/2024 11:07    Disposition: Discharge disposition: 01-Home or Self Care          Follow-up Information  Vernetta Lonni GRADE, MD Follow up in 2 week(s).   Specialty: Orthopedic Surgery Contact information: [address] [place]  [place] [address] [phone]                  Signed: ELSPETH PARKER 05/05/2024, 8:06 AM

## 2024-05-05 NOTE — Progress Notes (Signed)
 Subjective: 2 Days Post-Op Procedure(s) (LRB): ARTHROPLASTY, KNEE, TOTAL (Left) Patient reports pain as moderate.  Much improved this morning.  Nausea is improved. Objective: Vital signs in last 24 hours: Temp:  [97.7 F (36.5 C)-98.6 F (37 C)] 98.5 F (36.9 C) (06/29 0545) Pulse Rate:  [92-105] 105 (06/29 0545) Resp:  [15-19] 15 (06/29 0545) BP: (130-143)/(67-80) 130/67 (06/29 0545) SpO2:  [99 %-100 %] 100 % (06/29 0545)  Intake/Output from previous day: 06/28 0701 - 06/29 0700 In: 1458.1 [P.O.:1340; I.V.:118.1] Out: 1200 [Urine:1200] Intake/Output this shift: No intake/output data recorded.  No results for input(s): HGB in the last 72 hours. No results for input(s): WBC, RBC, HCT, PLT in the last 72 hours. No results for input(s): NA, K, CL, CO2, BUN, CREATININE, GLUCOSE, CALCIUM in the last 72 hours. No results for input(s): LABPT, INR in the last 72 hours.  Sensation intact distally Intact pulses distally Dorsiflexion/Plantar flexion intact Incision: scant drainage Compartment soft   Assessment/Plan: 2 Days Post-Op Procedure(s) (LRB): ARTHROPLASTY, KNEE, TOTAL (Left) Up with therapy Discharge home with home health today      ELSPETH PARKER 05/05/2024, 8:05 AM

## 2024-05-06 ENCOUNTER — Encounter (HOSPITAL_COMMUNITY): Payer: Self-pay | Admitting: Orthopaedic Surgery

## 2024-05-08 ENCOUNTER — Encounter: Payer: Self-pay | Admitting: Orthopaedic Surgery

## 2024-05-08 ENCOUNTER — Other Ambulatory Visit: Payer: Self-pay | Admitting: Orthopaedic Surgery

## 2024-05-08 ENCOUNTER — Telehealth: Payer: Self-pay | Admitting: Orthopaedic Surgery

## 2024-05-08 MED ORDER — OXYCODONE HCL 5 MG PO TABS: 5.0000 mg | ORAL_TABLET | Freq: Four times a day (QID) | ORAL | 0 refills | Status: DC | PRN

## 2024-05-08 MED ORDER — OXYCODONE HCL 5 MG PO TABS: 5.0000 mg | ORAL_TABLET | ORAL | 0 refills | Status: DC | PRN

## 2024-05-08 NOTE — Telephone Encounter (Signed)
 Pt called requesting a refill of oxycodone. Please send to [facility] [address] Pt [hone number is [phone].

## 2024-05-15 ENCOUNTER — Other Ambulatory Visit: Payer: Self-pay | Admitting: Orthopaedic Surgery

## 2024-05-15 MED ORDER — OXYCODONE HCL 5 MG PO TABS: 5.0000 mg | ORAL_TABLET | Freq: Four times a day (QID) | ORAL | 0 refills | Status: DC | PRN

## 2024-05-17 ENCOUNTER — Other Ambulatory Visit: Payer: Self-pay | Admitting: Orthopaedic Surgery

## 2024-05-17 ENCOUNTER — Encounter: Payer: Self-pay | Admitting: Orthopaedic Surgery

## 2024-05-17 MED ORDER — OXYCODONE HCL 5 MG PO TABS: 5.0000 mg | ORAL_TABLET | Freq: Four times a day (QID) | ORAL | 0 refills | Status: DC | PRN

## 2024-05-20 ENCOUNTER — Ambulatory Visit (INDEPENDENT_AMBULATORY_CARE_PROVIDER_SITE_OTHER): Admitting: Orthopaedic Surgery

## 2024-05-20 ENCOUNTER — Encounter: Payer: Self-pay | Admitting: Orthopaedic Surgery

## 2024-05-20 DIAGNOSIS — Z96652 Presence of left artificial knee joint: Secondary | ICD-10-CM

## 2024-05-20 NOTE — Progress Notes (Signed)
 The patient is here today for first postoperative visit status post a left total knee replacement.  She is only 53 years old.  She is ambulating with a walker.  She denies any calf pain.  She has been compliant with a baby aspirin  twice daily.  On exam her extension is almost full with her left knee and I can flex her to about 80 degrees.  Staples have been removed and Steri-Strips applied and the incision looks good.  Her calf is soft.  We will transition her to outpatient physical therapy hopefully upstairs here within the next week.  She recently had a refill of pain medications and she knows if she runs low that we need to keep her on these in order to get her knee bending and moving.  All questions and concerns were answered and addressed.  Will see her back in a month to see what her range of motion is like.

## 2024-05-21 ENCOUNTER — Other Ambulatory Visit: Payer: Self-pay

## 2024-05-21 DIAGNOSIS — Z96652 Presence of left artificial knee joint: Secondary | ICD-10-CM

## 2024-05-22 ENCOUNTER — Ambulatory Visit: Admitting: Rehabilitative and Restorative Service Providers"

## 2024-05-22 ENCOUNTER — Encounter: Payer: Self-pay | Admitting: Rehabilitative and Restorative Service Providers"

## 2024-05-22 DIAGNOSIS — M25662 Stiffness of left knee, not elsewhere classified: Secondary | ICD-10-CM | POA: Diagnosis not present

## 2024-05-22 DIAGNOSIS — R262 Difficulty in walking, not elsewhere classified: Secondary | ICD-10-CM

## 2024-05-22 DIAGNOSIS — M6281 Muscle weakness (generalized): Secondary | ICD-10-CM

## 2024-05-22 DIAGNOSIS — G8929 Other chronic pain: Secondary | ICD-10-CM

## 2024-05-22 DIAGNOSIS — R600 Localized edema: Secondary | ICD-10-CM

## 2024-05-22 DIAGNOSIS — M25562 Pain in left knee: Secondary | ICD-10-CM | POA: Diagnosis not present

## 2024-05-22 NOTE — Therapy (Addendum)
 OUTPATIENT PHYSICAL THERAPY  EVALUATION   Patient Name: Laurie Solomon MRN: [id number] DOB:1971-06-30, 53 y.o., female Today's Date: 05/22/2024  END OF SESSION:  PT End of Session - 05/22/24 1524     Visit Number 1    Number of Visits 20    Date for PT Re-Evaluation 07/31/24    Authorization Type [employer] STATE $52 copay    Progress Note Due on Visit 10    PT Start Time 1516    PT Stop Time 1603    PT Time Calculation (min) 47 min    Activity Tolerance Patient limited by pain    Behavior During Therapy [facility] for tasks assessed/performed          Past Medical History:  Diagnosis Date   Agatston coronary artery calcium score less than 100    Allergic rhinitis    Allergy     Arthritis    Asthma    childhood   Chronic insomnia    Headache(784.0)    Hx of adenomatous polyp of colon 05/13/2021   diminutive x 1   Hypothyroidism 07/09/2015   Post traumatic stress disorder    Vitamin D  deficiency 07/09/2015   Past Surgical History:  Procedure Laterality Date   COLONOSCOPY W/ POLYPECTOMY  05/13/2021   REFRACTIVE SURGERY Bilateral 2010   Lasik   TOTAL KNEE ARTHROPLASTY Left 05/03/2024   Procedure: ARTHROPLASTY, KNEE, TOTAL;  Surgeon: Vernetta Lonni GRADE, MD;  Location: [facility];  Service: Orthopedics;  Laterality: Left;   WISDOM TOOTH EXTRACTION     Patient Active Problem List   Diagnosis Date Noted   Status post total left knee replacement 05/03/2024   Unilateral primary osteoarthritis, left knee 06/07/2023   HLD (hyperlipidemia) 06/05/2021   Hyperglycemia 06/01/2021   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   Depression 10/10/2020   Insect bite 10/10/2020   Right ear pain 10/08/2020   ETD (Eustachian tube dysfunction), bilateral 10/05/2020   Non-recurrent acute suppurative otitis media of right ear with spontaneous rupture of tympanic membrane 10/05/2020   Rhinitis, chronic 10/05/2020   Chronic cholecystitis 04/29/2020   Epigastric pain  12/03/2018   Snoring 10/13/2016   Asthma with exacerbation 10/13/2016   Vitamin D  deficiency 07/09/2015   Hypothyroidism 07/09/2015   Morbid obesity (HCC) 01/18/2013   Encounter for well adult exam with abnormal findings 05/02/2011   Left knee pain 05/02/2011   Seasonal and perennial allergic rhinitis 03/01/2010   SNORING 03/01/2010   Allergic-infective asthma 12/16/2008   INSOMNIA, CHRONIC 07/25/2007   PTSD 07/25/2007   Headache(784.0) 07/25/2007    PCP: Norleen Lynwood MOHR MD  REFERRING PROVIDER: Vernetta Lonni GRADE, MD  REFERRING DIAG: [phone] (ICD-10-CM) - Status post total left knee replacement  THERAPY DIAG:  Chronic pain of left knee  Muscle weakness (generalized)  Stiffness of left knee, not elsewhere classified  Difficulty in walking, not elsewhere classified  Localized edema  Rationale for Evaluation and Treatment: Rehabilitation  ONSET DATE: Lt TKA 05/03/2024  SUBJECTIVE:   SUBJECTIVE STATEMENT: Pt came to clinic s/p Lt TKA 05/03/2024.  Pt indicated taking less pain medicine than before. Pt indicated having trouble at night.  Sleeping in the bed.  Reported taking shower by herself with slight step. Pt indicated doing some walking at home without walker.  Pt indicated limited in independent walking/standing.     PERTINENT HISTORY: Arrhritis.    PAIN:  NPRS scale: at worst 5/10, at best 0/10.  Pain location: Lt knee Pain description: tightness, ache.  Aggravating factors: WB pressure, end range, static positioning.  Relieving factors: medicine, icing   PRECAUTIONS: None  WEIGHT BEARING RESTRICTIONS: No  FALLS:  Has patient fallen in last 6 months? No  LIVING ENVIRONMENT: Lives in: House/apartment Stairs: Child psychotherapist on main floor, has stairs.  2 to enter from garage.  Has following equipment at home: FWW, SPC, elevated toilet seat  OCCUPATION: Remote work from home, part time.   PLOF: Independent, walking dog for exercise.   PATIENT GOALS: Reduce  pain, dog walking   OBJECTIVE:   PATIENT SURVEYS:  Patient-Specific Activity Scoring Scheme  0 represents "unable to perform." 10 represents "able to perform at prior level. 0 1 2 3 4 5 6 7 8 9  10 (Date and Score)   Activity Eval  05/22/2024    1. Dog walking  0    2. Putting on pants  8    3. Steps  1   4.     5.    Score 3 avg    Total score = sum of the activity scores/number of activities Minimum detectable change (90%CI) for average score = 2 points Minimum detectable change (90%CI) for single activity score = 3 points  COGNITION: 05/22/2024 Overall cognitive status: WFL    SENSATION: 05/22/2024 WFl  EDEMA:  05/22/2024 Localized edema noted in Lt leg.  Lt knee joint 14.5 inch  Rt knee joint 13.2 inch  MUSCLE LENGTH: 05/22/2024 No specific testing  POSTURE:  05/22/2024 Weight shift to Rt in standing.   PALPATION: 05/22/2024 General tenderness around knee Lt.   LOWER EXTREMITY MMT:   ROM Right Eval 05/22/2024 Left Eval 05/22/2024  Hip flexion 5/5 4/5  Hip extension    Hip abduction    Hip adduction    Hip internal rotation    Hip external rotation    Knee flexion 5/5 3+/5  Knee extension 5/5 4/5  Ankle dorsiflexion    Ankle plantarflexion    Ankle inversion    Ankle eversion     (Blank rows = not tested)  LOWER EXTREMITY ROM:  MMT Right Eval 05/22/2024 Left Eval 05/22/2024  Hip flexion    Hip extension    Hip abduction    Hip adduction    Hip internal rotation    Hip external rotation    Knee flexion    Knee extension  AROM in seated LAQ -16  Passive heel prop  -9  Ankle dorsiflexion    Ankle plantarflexion    Ankle inversion    Ankle eversion     (Blank rows = not tested)  LOWER EXTREMITY SPECIAL TESTS:  05/22/2024 No specific testing.   FUNCTIONAL TESTS:  05/22/2024 18 inch chair transfer: no UE but Rt leg dominant movement.  Lt SLS: unable  Rt SLS: not tested  GAIT: 05/22/2024 FWW use in clinic household distances.   Reduced stance on Lt, lacking TKE in stance.  TODAY'S TREATMENT                                                                          DATE: 05/22/2024 Therex:    HEP instruction/performance c cues for techniques, handout provided.  Trial set performed of each for comprehension and symptom assessment.  See below for exercise list  Self care Education on edema control with elevation, ankle pumps and icing to help reduced swelling.  Discussion about routine mobility intervention to promote gains in mobility and manage stiffness complaints.   Vaso 10 mins Lt knee in elevation medium compression 34 degrees.  Performed with self care education.   PATIENT EDUCATION:  05/22/2024 Education details: HEP, POC Person educated: Patient Education method: Programmer, multimedia, Demonstration, Verbal cues, and Handouts Education comprehension: verbalized understanding, returned demonstration, and verbal cues required  HOME EXERCISE PROGRAM: Access Code: [email or web address] URL: [email or web address] Date: 05/22/2024 Prepared by: Ozell Silvan  Exercises - Supine Heel Slide (Mirrored)  - 3-5 x daily - 7 x weekly - 1 sets - 10 reps - 5 hold - Supine Heel Slide with Strap  - 3-5 x daily - 7 x weekly - 1 sets - 10 reps - 5 hold - Seated Long Arc Quad (Mirrored)  - 3-5 x daily - 7 x weekly - 1 sets - 5-10 reps - 2 hold - Supine Knee Extension Mobilization with Weight (Mirrored)  - 4-5 x daily - 7 x weekly - 1 sets - 1 reps - to tolerance up to 5 mins hold - Seated Quad Set (Mirrored)  - 3-5 x daily - 7 x weekly - 1 sets - 10 reps - 5 hold - Seated Knee Flexion Extension AAROM with Overpressure (Mirrored)  - 2-3 x daily - 7 x weekly - 1 sets - 5 reps - 10 hold  ASSESSMENT:  CLINICAL IMPRESSION: Patient is a 53 y.o. who comes to clinic with  complaints of Lt knee pain s/p recent Lt TKA 05/03/2024 with mobility, strength and movement coordination deficits that impair their ability to perform usual daily and recreational functional activities without increase difficulty/symptoms at this time.  Patient to benefit from skilled PT services to address impairments and limitations to improve to previous level of function without restriction secondary to condition.   OBJECTIVE IMPAIRMENTS: Abnormal gait, decreased activity tolerance, decreased balance, decreased coordination, decreased endurance, decreased mobility, difficulty walking, decreased ROM, decreased strength, hypomobility, increased edema, increased fascial restrictions, impaired perceived functional ability, increased muscle spasms, impaired flexibility, improper body mechanics, and pain.   ACTIVITY LIMITATIONS: carrying, lifting, bending, sitting, standing, squatting, sleeping, stairs, transfers, bed mobility, bathing, toileting, dressing, hygiene/grooming, and locomotion level  PARTICIPATION LIMITATIONS: meal prep, cleaning, laundry, interpersonal relationship, driving, shopping, community activity, occupation, and yard work  PERSONAL FACTORS: no specific factors are also affecting patient's functional outcome.   REHAB POTENTIAL: Good  CLINICAL DECISION MAKING: Stable/uncomplicated  EVALUATION COMPLEXITY: Low   GOALS: Goals reviewed with patient? Yes  SHORT TERM GOALS: (target date for Short term goals are 3 weeks 06/12/2024)   1.  Patient will demonstrate independent use of home exercise program to maintain progress from in clinic treatments.  Goal status: New  LONG TERM GOALS: (target dates for all long term goals are 10  weeks  07/31/2024 )   1. Patient will demonstrate/report pain at worst less than or equal to 2/10 to facilitate minimal limitation in daily activity secondary to pain symptoms.  Goal status: New   2. Patient will demonstrate independent use of home  exercise program to facilitate ability to maintain/progress functional gains from skilled physical therapy services.  Goal status: New   3. Patient will demonstrate Patient specific functional scale avg > or = 8/10 to indicate reduced disability due to condition.   Goal status: New   4.  Patient will demonstrate Lt  LE MMT 5/5 throughout to faciltiate usual transfers, stairs, squatting at [facility] for daily life.   Goal status: New   5.  Patient will demonstrate Lt knee AROM 0-110 deg to facilitate mobility for transfers, walking, stairs at PLOF.  Goal status: New   6. Patient will demonstrate ascending/descending stairs reciprocally s UE assist for community integration.   Goal status: New   7.  Patient will demonstrate independent ambulation > 500 ft for community ambulation , dog walking at PLOF.  Goal Status: New   PLAN:  PT FREQUENCY: 1-2x/week  PT DURATION: 10 weeks  PLANNED INTERVENTIONS: Can include 02853- PT Re-evaluation, 97110-Therapeutic exercises, 97530- Therapeutic activity, [id number]- Neuromuscular re-education, 97535- Self Care, 97140- Manual therapy, [phone]- Gait training, [phone]- Orthotic Fit/training, [phone]- Canalith repositioning, [id number]- Aquatic Therapy, [phone]- Electrical stimulation (unattended), [id number] Physical performance testing, 97016- Vasopneumatic device, [id number]- Ultrasound, [id number]- Traction (mechanical), [id number]- Ionotophoresis 4mg /ml Dexamethasone ,  [id number] - Needle insertion w/o injection 1 or 2 muscles, 20561 - Needle insertion w/o injection 3 or more muscles.   Patient/Family education, Balance training, Stair training, Taping, Dry Needling, Joint mobilization, Joint manipulation, Spinal manipulation, Spinal mobilization, Scar mobilization, Vestibular training, Visual/preceptual remediation/compensation, DME instructions, Cryotherapy, and Moist heat.  All performed as medically necessary.  All included unless contraindicated  PLAN FOR NEXT SESSION: Review HEP  knowledge/results.  Manual and intervention for flexion mobility gains, early strengthening activation as able.  SPC use possible?  Ozell Silvan, PT, DPT, OCS, ATC 05/22/24  4:11 PM

## 2024-05-27 ENCOUNTER — Ambulatory Visit: Admitting: Rehabilitative and Restorative Service Providers"

## 2024-05-27 ENCOUNTER — Encounter: Payer: Self-pay | Admitting: Rehabilitative and Restorative Service Providers"

## 2024-05-27 DIAGNOSIS — M6281 Muscle weakness (generalized): Secondary | ICD-10-CM

## 2024-05-27 DIAGNOSIS — M25562 Pain in left knee: Secondary | ICD-10-CM | POA: Diagnosis not present

## 2024-05-27 DIAGNOSIS — M25662 Stiffness of left knee, not elsewhere classified: Secondary | ICD-10-CM | POA: Diagnosis not present

## 2024-05-27 DIAGNOSIS — G8929 Other chronic pain: Secondary | ICD-10-CM

## 2024-05-27 DIAGNOSIS — R262 Difficulty in walking, not elsewhere classified: Secondary | ICD-10-CM

## 2024-05-27 DIAGNOSIS — R600 Localized edema: Secondary | ICD-10-CM

## 2024-05-27 NOTE — Therapy (Signed)
 OUTPATIENT PHYSICAL THERAPY  TREATMENT   Patient Name: Laurie Solomon MRN: [id number] DOB:October 10, 1971, 53 y.o., female Today's Date: 05/27/2024  END OF SESSION:  PT End of Session - 05/27/24 1524     Visit Number 2    Number of Visits 20    Date for PT Re-Evaluation 07/31/24    Authorization Type [employer] $52 copay    Progress Note Due on Visit 10    PT Start Time 1519    PT Stop Time 1607    PT Time Calculation (min) 48 min    Activity Tolerance Patient tolerated treatment well    Behavior During Therapy [facility] for tasks assessed/performed           Past Medical History:  Diagnosis Date   Agatston coronary artery calcium score less than 100    Allergic rhinitis    Allergy     Arthritis    Asthma    childhood   Chronic insomnia    Headache(784.0)    Hx of adenomatous polyp of colon 05/13/2021   diminutive x 1   Hypothyroidism 07/09/2015   Post traumatic stress disorder    Vitamin D  deficiency 07/09/2015   Past Surgical History:  Procedure Laterality Date   COLONOSCOPY W/ POLYPECTOMY  05/13/2021   REFRACTIVE SURGERY Bilateral 2010   Lasik   TOTAL KNEE ARTHROPLASTY Left 05/03/2024   Procedure: ARTHROPLASTY, KNEE, TOTAL;  Surgeon: Vernetta Lonni GRADE, MD;  Location: [facility];  Service: Orthopedics;  Laterality: Left;   WISDOM TOOTH EXTRACTION     Patient Active Problem List   Diagnosis Date Noted   Status post total left knee replacement 05/03/2024   Unilateral primary osteoarthritis, left knee 06/07/2023   HLD (hyperlipidemia) 06/05/2021   Hyperglycemia 06/01/2021   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   Depression 10/10/2020   Insect bite 10/10/2020   Right ear pain 10/08/2020   ETD (Eustachian tube dysfunction), bilateral 10/05/2020   Non-recurrent acute suppurative otitis media of right ear with spontaneous rupture of tympanic membrane 10/05/2020   Rhinitis, chronic 10/05/2020   Chronic cholecystitis 04/29/2020    Epigastric pain 12/03/2018   Snoring 10/13/2016   Asthma with exacerbation 10/13/2016   Vitamin D  deficiency 07/09/2015   Hypothyroidism 07/09/2015   Morbid obesity (HCC) 01/18/2013   Encounter for well adult exam with abnormal findings 05/02/2011   Left knee pain 05/02/2011   Seasonal and perennial allergic rhinitis 03/01/2010   SNORING 03/01/2010   Allergic-infective asthma 12/16/2008   INSOMNIA, CHRONIC 07/25/2007   PTSD 07/25/2007   Headache(784.0) 07/25/2007    PCP: Norleen Lynwood MOHR MD  REFERRING PROVIDER: Vernetta Lonni GRADE, MD  REFERRING DIAG: [phone] (ICD-10-CM) - Status post total left knee replacement  THERAPY DIAG:  Chronic pain of left knee  Muscle weakness (generalized)  Stiffness of left knee, not elsewhere classified  Difficulty in walking, not elsewhere classified  Localized edema  Rationale for Evaluation and Treatment: Rehabilitation  ONSET DATE: Lt TKA 05/03/2024  SUBJECTIVE:   SUBJECTIVE STATEMENT: Pt indicated no specific pain upon arrival.  Reported nighttime symptoms still noted.  Brought cane today but reported independent.    PERTINENT HISTORY: Arrhritis.    PAIN:  NPRS scale: at worst 5/10, at best 0/10.  Pain location: Lt knee Pain description: tightness, ache.  Aggravating factors: WB pressure, end range, static positioning.  Relieving factors: medicine, icing   PRECAUTIONS: None  WEIGHT BEARING RESTRICTIONS: No  FALLS:  Has patient fallen in last 6 months? No  LIVING ENVIRONMENT: Lives in: House/apartment Stairs: Child psychotherapist on main floor, has stairs.  2 to enter from garage.  Has following equipment at home: FWW, SPC, elevated toilet seat  OCCUPATION: Remote work from home, part time.   PLOF: Independent, walking dog for exercise.   PATIENT GOALS: Reduce pain, dog walking   OBJECTIVE:   PATIENT SURVEYS:  Patient-Specific Activity Scoring Scheme  0 represents "unable to perform." 10 represents "able to perform  at prior level. 0 1 2 3 4 5 6 7 8 9  10 (Date and Score)   Activity Eval  05/22/2024    1. Dog walking  0    2. Putting on pants  8    3. Steps  1   4.     5.    Score 3 avg    Total score = sum of the activity scores/number of activities Minimum detectable change (90%CI) for average score = 2 points Minimum detectable change (90%CI) for single activity score = 3 points  COGNITION: 05/22/2024 Overall cognitive status: WFL    SENSATION: 05/22/2024 WFl  EDEMA:  05/22/2024 Localized edema noted in Lt leg.  Lt knee joint 14.5 inch  Rt knee joint 13.2 inch  MUSCLE LENGTH: 05/22/2024 No specific testing  POSTURE:  05/22/2024 Weight shift to Rt in standing.   PALPATION: 05/22/2024 General tenderness around knee Lt.   LOWER EXTREMITY MMT:   ROM Right Eval 05/22/2024 Left Eval 05/22/2024  Hip flexion 5/5 4/5  Hip extension    Hip abduction    Hip adduction    Hip internal rotation    Hip external rotation    Knee flexion 5/5 3+/5  Knee extension 5/5 4/5  Ankle dorsiflexion    Ankle plantarflexion    Ankle inversion    Ankle eversion     (Blank rows = not tested)  LOWER EXTREMITY ROM:  ROM Right Eval 05/22/2024 Left Eval 05/22/2024 Left 05/27/2024  Hip flexion     Hip extension     Hip abduction     Hip adduction     Hip internal rotation     Hip external rotation     Knee flexion   Supine AROM heel slide 90  Knee extension  AROM in seated LAQ -16  Passive heel prop  -9 AROM in setaed LAQ -5  -4 passive heel prop  Ankle dorsiflexion     Ankle plantarflexion     Ankle inversion     Ankle eversion      (Blank rows = not tested)  LOWER EXTREMITY SPECIAL TESTS:  05/22/2024 No specific testing.   FUNCTIONAL TESTS:  05/22/2024 18 inch chair transfer: no UE but Rt leg dominant movement.  Lt SLS: unable  Rt SLS: not tested  GAIT: 05/27/2024: SPC use in clinic with mod independence.  Able to demonstrate independent ambulation as well.    05/22/2024 FWW use in clinic household distances.  Reduced stance on Lt, lacking TKE in stance.  TODAY'S TREATMENT                                                                          DATE: 05/27/2024 Therex: Recumbent bike partial circles, seat 6 8 mins for ROM Incline gastroc stretch 30 sec x 3 bilateral  Seated tailgate flexion 1 min Seated Lt leg LAQ 2 lb weight 2 x 10 with pause in end range flexion and extension, contralateral leg movement opposite.  Supine heel slide AROM 5 sec hold x 10    Neuro Re-ed Tandem stance balance 1 min x 1 bilateral occasional HHA Heel /toe lifts alternating with single hand on bar x 15 each way   Manual Lt knee flexion c distraction/IR mobilization c movement c contralateral leg opposite.    Gait Training SPC verbal cues for placement and sequencing.  Trial in Clinic household distances throughout visit.  Gait training for knee flexion in swing by step over and back with forward and retro step weight shift 4 inch step x 10 bilateral   Vaso 10 mins Lt knee in elevation medium compression 34 degrees.  Performed with self care education.   TODAY'S TREATMENT                                                                          DATE: 05/22/2024 Therex:    HEP instruction/performance c cues for techniques, handout provided.  Trial set performed of each for comprehension and symptom assessment.  See below for exercise list  Self care Education on edema control with elevation, ankle pumps and icing to help reduced swelling.  Discussion about routine mobility intervention to promote gains in mobility and manage stiffness complaints.   Vaso 10 mins Lt knee in elevation medium compression 34 degrees.  Performed with self care education.   PATIENT EDUCATION:  05/22/2024 Education  details: HEP, POC Person educated: Patient Education method: Programmer, multimedia, Demonstration, Verbal cues, and Handouts Education comprehension: verbalized understanding, returned demonstration, and verbal cues required  HOME EXERCISE PROGRAM: Access Code: [email or web address] URL: [email or web address] Date: 05/22/2024 Prepared by: Ozell Silvan  Exercises - Supine Heel Slide (Mirrored)  - 3-5 x daily - 7 x weekly - 1 sets - 10 reps - 5 hold - Supine Heel Slide with Strap  - 3-5 x daily - 7 x weekly - 1 sets - 10 reps - 5 hold - Seated Long Arc Quad (Mirrored)  - 3-5 x daily - 7 x weekly - 1 sets - 5-10 reps - 2 hold - Supine Knee Extension Mobilization with Weight (Mirrored)  - 4-5 x daily - 7 x weekly - 1 sets - 1 reps - to tolerance up to 5 mins hold - Seated Quad Set (Mirrored)  - 3-5 x daily - 7 x weekly - 1 sets - 10 reps - 5 hold - Seated Knee Flexion Extension AAROM with Overpressure (Mirrored)  - 2-3 x daily - 7 x weekly - 1 sets - 5 reps -  10 hold  ASSESSMENT:  CLINICAL IMPRESSION: Knee flexion in swing with Lt leg still limited and impactful for foot clearance in ambulation, stairs, etc.   Improvements noted overall compared to evaluation.  Continued skilled PT services indicated at this time to continue improvements.   OBJECTIVE IMPAIRMENTS: Abnormal gait, decreased activity tolerance, decreased balance, decreased coordination, decreased endurance, decreased mobility, difficulty walking, decreased ROM, decreased strength, hypomobility, increased edema, increased fascial restrictions, impaired perceived functional ability, increased muscle spasms, impaired flexibility, improper body mechanics, and pain.   ACTIVITY LIMITATIONS: carrying, lifting, bending, sitting, standing, squatting, sleeping, stairs, transfers, bed mobility, bathing, toileting, dressing, hygiene/grooming, and locomotion level  PARTICIPATION LIMITATIONS: meal prep, cleaning, laundry, interpersonal relationship,  driving, shopping, community activity, occupation, and yard work  PERSONAL FACTORS: no specific factors are also affecting patient's functional outcome.   REHAB POTENTIAL: Good  CLINICAL DECISION MAKING: Stable/uncomplicated  EVALUATION COMPLEXITY: Low   GOALS: Goals reviewed with patient? Yes  SHORT TERM GOALS: (target date for Short term goals are 3 weeks 06/12/2024)   1.  Patient will demonstrate independent use of home exercise program to maintain progress from in clinic treatments.  Goal status: on going 05/27/2024  LONG TERM GOALS: (target dates for all long term goals are 10 weeks  07/31/2024 )   1. Patient will demonstrate/report pain at worst less than or equal to 2/10 to facilitate minimal limitation in daily activity secondary to pain symptoms.  Goal status: New   2. Patient will demonstrate independent use of home exercise program to facilitate ability to maintain/progress functional gains from skilled physical therapy services.  Goal status: New   3. Patient will demonstrate Patient specific functional scale avg > or = 8/10 to indicate reduced disability due to condition.   Goal status: New   4.  Patient will demonstrate Lt  LE MMT 5/5 throughout to faciltiate usual transfers, stairs, squatting at [facility] for daily life.   Goal status: New   5.  Patient will demonstrate Lt knee AROM 0-110 deg to facilitate mobility for transfers, walking, stairs at PLOF.  Goal status: New   6. Patient will demonstrate ascending/descending stairs reciprocally s UE assist for community integration.   Goal status: New   7.  Patient will demonstrate independent ambulation > 500 ft for community ambulation , dog walking at PLOF.  Goal Status: New   PLAN:  PT FREQUENCY: 1-2x/week  PT DURATION: 10 weeks  PLANNED INTERVENTIONS: Can include 02853- PT Re-evaluation, 97110-Therapeutic exercises, 97530- Therapeutic activity, [id number]- Neuromuscular re-education, 97535- Self Care, [id number]-  Manual therapy, [phone]- Gait training, [phone]- Orthotic Fit/training, [phone]- Canalith repositioning, [id number]- Aquatic Therapy, [phone]- Electrical stimulation (unattended), [id number] Physical performance testing, 97016- Vasopneumatic device, [id number]- Ultrasound, [id number]- Traction (mechanical), [id number]- Ionotophoresis 4mg /ml Dexamethasone ,  [id number] - Needle insertion w/o injection 1 or 2 muscles, 20561 - Needle insertion w/o injection 3 or more muscles.   Patient/Family education, Balance training, Stair training, Taping, Dry Needling, Joint mobilization, Joint manipulation, Spinal manipulation, Spinal mobilization, Scar mobilization, Vestibular training, Visual/preceptual remediation/compensation, DME instructions, Cryotherapy, and Moist heat.  All performed as medically necessary.  All included unless contraindicated  PLAN FOR NEXT SESSION: Quad strengthening, addition of leg press?  Flexion mobility gains.   Ozell Silvan, PT, DPT, OCS, ATC 05/27/24  4:02 PM

## 2024-05-30 ENCOUNTER — Other Ambulatory Visit: Payer: Self-pay | Admitting: Orthopaedic Surgery

## 2024-05-30 ENCOUNTER — Ambulatory Visit: Payer: Self-pay | Admitting: Rehabilitative and Restorative Service Providers"

## 2024-05-30 ENCOUNTER — Encounter: Payer: Self-pay | Admitting: Rehabilitative and Restorative Service Providers"

## 2024-05-30 DIAGNOSIS — R262 Difficulty in walking, not elsewhere classified: Secondary | ICD-10-CM | POA: Diagnosis not present

## 2024-05-30 DIAGNOSIS — M25562 Pain in left knee: Secondary | ICD-10-CM | POA: Diagnosis not present

## 2024-05-30 DIAGNOSIS — M6281 Muscle weakness (generalized): Secondary | ICD-10-CM

## 2024-05-30 DIAGNOSIS — M25662 Stiffness of left knee, not elsewhere classified: Secondary | ICD-10-CM | POA: Diagnosis not present

## 2024-05-30 DIAGNOSIS — G8929 Other chronic pain: Secondary | ICD-10-CM

## 2024-05-30 DIAGNOSIS — R600 Localized edema: Secondary | ICD-10-CM

## 2024-05-30 MED ORDER — OXYCODONE HCL 5 MG PO TABS: 5.0000 mg | ORAL_TABLET | Freq: Four times a day (QID) | ORAL | 0 refills | Status: DC | PRN

## 2024-05-30 NOTE — Therapy (Signed)
 OUTPATIENT PHYSICAL THERAPY  TREATMENT   Patient Name: Talyn Eddie MRN: [id number] DOB:March 04, 1971, 53 y.o., female Today's Date: 05/30/2024  END OF SESSION:  PT End of Session - 05/30/24 1343     Visit Number 3    Number of Visits 20    Date for PT Re-Evaluation 07/31/24    Authorization Type [employer] $52 copay    Progress Note Due on Visit 10    PT Start Time 1343    PT Stop Time 1437    PT Time Calculation (min) 54 min    Activity Tolerance Patient tolerated treatment well    Behavior During Therapy [facility] for tasks assessed/performed            Past Medical History:  Diagnosis Date   Agatston coronary artery calcium score less than 100    Allergic rhinitis    Allergy     Arthritis    Asthma    childhood   Chronic insomnia    Headache(784.0)    Hx of adenomatous polyp of colon 05/13/2021   diminutive x 1   Hypothyroidism 07/09/2015   Post traumatic stress disorder    Vitamin D  deficiency 07/09/2015   Past Surgical History:  Procedure Laterality Date   COLONOSCOPY W/ POLYPECTOMY  05/13/2021   REFRACTIVE SURGERY Bilateral 2010   Lasik   TOTAL KNEE ARTHROPLASTY Left 05/03/2024   Procedure: ARTHROPLASTY, KNEE, TOTAL;  Surgeon: Vernetta Lonni GRADE, MD;  Location: [facility];  Service: Orthopedics;  Laterality: Left;   WISDOM TOOTH EXTRACTION     Patient Active Problem List   Diagnosis Date Noted   Status post total left knee replacement 05/03/2024   Unilateral primary osteoarthritis, left knee 06/07/2023   HLD (hyperlipidemia) 06/05/2021   Hyperglycemia 06/01/2021   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   Depression 10/10/2020   Insect bite 10/10/2020   Right ear pain 10/08/2020   ETD (Eustachian tube dysfunction), bilateral 10/05/2020   Non-recurrent acute suppurative otitis media of right ear with spontaneous rupture of tympanic membrane 10/05/2020   Rhinitis, chronic 10/05/2020   Chronic cholecystitis 04/29/2020    Epigastric pain 12/03/2018   Snoring 10/13/2016   Asthma with exacerbation 10/13/2016   Vitamin D  deficiency 07/09/2015   Hypothyroidism 07/09/2015   Morbid obesity (HCC) 01/18/2013   Encounter for well adult exam with abnormal findings 05/02/2011   Left knee pain 05/02/2011   Seasonal and perennial allergic rhinitis 03/01/2010   SNORING 03/01/2010   Allergic-infective asthma 12/16/2008   INSOMNIA, CHRONIC 07/25/2007   PTSD 07/25/2007   Headache(784.0) 07/25/2007    PCP: Norleen Lynwood MOHR MD  REFERRING PROVIDER: Vernetta Lonni GRADE, MD  REFERRING DIAG: [phone] (ICD-10-CM) - Status post total left knee replacement  THERAPY DIAG:  Chronic pain of left knee  Muscle weakness (generalized)  Stiffness of left knee, not elsewhere classified  Difficulty in walking, not elsewhere classified  Localized edema  Rationale for Evaluation and Treatment: Rehabilitation  ONSET DATE: Lt TKA 05/03/2024  SUBJECTIVE:   SUBJECTIVE STATEMENT: Patient overall doing well. She is now ambulating without cane out in the community and is doing well. She hasn't attempted a long walk yet. Patient mentioned discomfort with sleep.    PERTINENT HISTORY: Arrhritis.    PAIN:  NPRS scale: 0/10 at rest and 2/10 while sleeping  Pain location: Lt knee Pain description: tightness, ache.  Aggravating factors: WB pressure, end range, static positioning.  Relieving factors: medicine, icing   PRECAUTIONS: None  WEIGHT BEARING RESTRICTIONS:  No  FALLS:  Has patient fallen in last 6 months? No  LIVING ENVIRONMENT: Lives in: House/apartment Stairs: Child psychotherapist on main floor, has stairs.  2 to enter from garage.  Has following equipment at home: FWW, SPC, elevated toilet seat  OCCUPATION: Remote work from home, part time.   PLOF: Independent, walking dog for exercise.   PATIENT GOALS: Reduce pain, dog walking   OBJECTIVE:   PATIENT SURVEYS:  Patient-Specific Activity Scoring Scheme  0  represents "unable to perform." 10 represents "able to perform at prior level. 0 1 2 3 4 5 6 7 8 9  10 (Date and Score)   Activity Eval  05/22/2024    1. Dog walking  0    2. Putting on pants  8    3. Steps  1   4.     5.    Score 3 avg    Total score = sum of the activity scores/number of activities Minimum detectable change (90%CI) for average score = 2 points Minimum detectable change (90%CI) for single activity score = 3 points  COGNITION: 05/22/2024 Overall cognitive status: WFL    SENSATION: 05/22/2024 WFl  EDEMA:  05/22/2024 Localized edema noted in Lt leg.  Lt knee joint 14.5 inch  Rt knee joint 13.2 inch  MUSCLE LENGTH: 05/22/2024 No specific testing  POSTURE:  05/22/2024 Weight shift to Rt in standing.   PALPATION: 05/22/2024 General tenderness around knee Lt.   LOWER EXTREMITY MMT:   ROM Right Eval 05/22/2024 Left Eval 05/22/2024  Hip flexion 5/5 4/5  Hip extension    Hip abduction    Hip adduction    Hip internal rotation    Hip external rotation    Knee flexion 5/5 3+/5  Knee extension 5/5 4/5  Ankle dorsiflexion    Ankle plantarflexion    Ankle inversion    Ankle eversion     (Blank rows = not tested)  LOWER EXTREMITY ROM:  ROM Right Eval 05/22/2024 Left Eval 05/22/2024 Left 05/27/2024  Hip flexion     Hip extension     Hip abduction     Hip adduction     Hip internal rotation     Hip external rotation     Knee flexion   Supine AROM heel slide 90  Knee extension  AROM in seated LAQ -16  Passive heel prop  -9 AROM in setaed LAQ -5  -4 passive heel prop  Ankle dorsiflexion     Ankle plantarflexion     Ankle inversion     Ankle eversion      (Blank rows = not tested)  LOWER EXTREMITY SPECIAL TESTS:  05/22/2024 No specific testing.   FUNCTIONAL TESTS:  05/22/2024 18 inch chair transfer: no UE but Rt leg dominant movement.  Lt SLS: unable  Rt SLS: not tested  GAIT: 05/27/2024: SPC use in clinic with mod independence.   Able to demonstrate independent ambulation as well.   05/22/2024 FWW use in clinic household distances.  Reduced stance on Lt, lacking TKE in stance.  TODAY'S TREATMENT                                                                          DATE: 05/30/2024 Therex:  Recumbent bike, seat 6 for 8 minutes, half circles for ROM BL leg press 62#, 2x10.  SL leg press 31#, 2x10 Step ups/down with L LE leading, 3x8. Minimal discomfort and initial trouble with eccentric control on the descent. Seated Lt leg LAQ w/ 2#, 1x10 w/ 5 second hold. Contralateral leg movement opposite  Heel slides   Neuro Re-ed: Tandem fwd/bwd walk. Initially on floor with B UE, L UE, and no UE. Progressed on foam with B UE, L UE, and no UE. 2x each. Most difficult was on foam w/ no UE Cone taps: 8x w/ B UE, 8x w/ L UE, and 2x8 w/ no UE. Patient reports it was difficult but no pain  Manual: Distraction/IR mobilization with overpressure into knee flexion. Contract, relax technique, 6x  Vaso 10 mins Lt knee in elevation medium compression 34 degrees  TREATMENT                                                                          DATE: 05/27/2024 Therex: Recumbent bike partial circles, seat 6 8 mins for ROM Incline gastroc stretch 30 sec x 3 bilateral  Seated tailgate flexion 1 min Seated Lt leg LAQ 2 lb weight 2 x 10 with pause in end range flexion and extension, contralateral leg movement opposite.  Supine heel slide AROM 5 sec hold x 10    Neuro Re-ed Tandem stance balance 1 min x 1 bilateral occasional HHA Heel /toe lifts alternating with single hand on bar x 15 each way   Manual Lt knee flexion c distraction/IR mobilization c movement c contralateral leg opposite.    Gait Training SPC verbal cues for placement and sequencing.  Trial in  Clinic household distances throughout visit.  Gait training for knee flexion in swing by step over and back with forward and retro step weight shift 4 inch step x 10 bilateral   Vaso 10 mins Lt knee in elevation medium compression 34 degrees.  Performed with self care education.   TODAY'S TREATMENT                                                                          DATE: 05/22/2024 Therex:    HEP instruction/performance c cues for techniques, handout provided.  Trial set performed of each for comprehension and symptom assessment.  See below for exercise list  Self care Education on edema control with elevation, ankle pumps and icing to help reduced swelling.  Discussion about routine mobility intervention to promote  gains in mobility and manage stiffness complaints.   Vaso 10 mins Lt knee in elevation medium compression 34 degrees.  Performed with self care education.   PATIENT EDUCATION:  05/22/2024 Education details: HEP, POC Person educated: Patient Education method: Programmer, multimedia, Demonstration, Verbal cues, and Handouts Education comprehension: verbalized understanding, returned demonstration, and verbal cues required  HOME EXERCISE PROGRAM: Access Code: [email or web address] URL: [email or web address] Date: 05/22/2024 Prepared by: Ozell Silvan  Exercises - Supine Heel Slide (Mirrored)  - 3-5 x daily - 7 x weekly - 1 sets - 10 reps - 5 hold - Supine Heel Slide with Strap  - 3-5 x daily - 7 x weekly - 1 sets - 10 reps - 5 hold - Seated Long Arc Quad (Mirrored)  - 3-5 x daily - 7 x weekly - 1 sets - 5-10 reps - 2 hold - Supine Knee Extension Mobilization with Weight (Mirrored)  - 4-5 x daily - 7 x weekly - 1 sets - 1 reps - to tolerance up to 5 mins hold - Seated Quad Set (Mirrored)  - 3-5 x daily - 7 x weekly - 1 sets - 10 reps - 5 hold - Seated Knee Flexion Extension AAROM with Overpressure (Mirrored)  - 2-3 x daily - 7 x weekly - 1 sets - 5 reps - 10  hold  ASSESSMENT:  CLINICAL IMPRESSION: Patient is successful walking without a cane.  Patient will continue to benefit from exercises that challenge eccentric control of the quads for functional activities such as stairs.  Patient responded well to manual technique to gain flexion.  Patient will continue to benefit from skilled physical therapy to address impairments.  OBJECTIVE IMPAIRMENTS: Abnormal gait, decreased activity tolerance, decreased balance, decreased coordination, decreased endurance, decreased mobility, difficulty walking, decreased ROM, decreased strength, hypomobility, increased edema, increased fascial restrictions, impaired perceived functional ability, increased muscle spasms, impaired flexibility, improper body mechanics, and pain.   ACTIVITY LIMITATIONS: carrying, lifting, bending, sitting, standing, squatting, sleeping, stairs, transfers, bed mobility, bathing, toileting, dressing, hygiene/grooming, and locomotion level  PARTICIPATION LIMITATIONS: meal prep, cleaning, laundry, interpersonal relationship, driving, shopping, community activity, occupation, and yard work  PERSONAL FACTORS: no specific factors are also affecting patient's functional outcome.   REHAB POTENTIAL: Good  CLINICAL DECISION MAKING: Stable/uncomplicated  EVALUATION COMPLEXITY: Low   GOALS: Goals reviewed with patient? Yes  SHORT TERM GOALS: (target date for Short term goals are 3 weeks 06/12/2024)   1.  Patient will demonstrate independent use of home exercise program to maintain progress from in clinic treatments.  Goal status: on going 05/27/2024  LONG TERM GOALS: (target dates for all long term goals are 10 weeks  07/31/2024 )   1. Patient will demonstrate/report pain at worst less than or equal to 2/10 to facilitate minimal limitation in daily activity secondary to pain symptoms.  Goal status: New   2. Patient will demonstrate independent use of home exercise program to facilitate  ability to maintain/progress functional gains from skilled physical therapy services.  Goal status: New   3. Patient will demonstrate Patient specific functional scale avg > or = 8/10 to indicate reduced disability due to condition.   Goal status: New   4.  Patient will demonstrate Lt  LE MMT 5/5 throughout to faciltiate usual transfers, stairs, squatting at [facility] for daily life.   Goal status: New   5.  Patient will demonstrate Lt knee AROM 0-110 deg to facilitate mobility for transfers, walking, stairs at PLOF.  Goal status: New   6. Patient will  demonstrate ascending/descending stairs reciprocally s UE assist for community integration.   Goal status: New   7.  Patient will demonstrate independent ambulation > 500 ft for community ambulation , dog walking at PLOF.  Goal Status: New   PLAN:  PT FREQUENCY: 1-2x/week  PT DURATION: 10 weeks  PLANNED INTERVENTIONS: Can include 02853- PT Re-evaluation, 97110-Therapeutic exercises, 97530- Therapeutic activity, [id number]- Neuromuscular re-education, 97535- Self Care, 97140- Manual therapy, [phone]- Gait training, [phone]- Orthotic Fit/training, [phone]- Canalith repositioning, [id number]- Aquatic Therapy, [phone]- Electrical stimulation (unattended), [id number] Physical performance testing, 97016- Vasopneumatic device, [id number]- Ultrasound, [id number]- Traction (mechanical), [id number]- Ionotophoresis 4mg /ml Dexamethasone ,  [id number] - Needle insertion w/o injection 1 or 2 muscles, 20561 - Needle insertion w/o injection 3 or more muscles.   Patient/Family education, Balance training, Stair training, Taping, Dry Needling, Joint mobilization, Joint manipulation, Spinal manipulation, Spinal mobilization, Scar mobilization, Vestibular training, Visual/preceptual remediation/compensation, DME instructions, Cryotherapy, and Moist heat.  All performed as medically necessary.  All included unless contraindicated  PLAN FOR NEXT SESSION: Flexion mobility gains. Eccentric control of quads  exercise.   Ismael Theophilus Stallion 05/30/24  2:44 PM

## 2024-05-31 NOTE — Therapy (Incomplete)
 OUTPATIENT PHYSICAL THERAPY  TREATMENT   Patient Name: Laurie Solomon MRN: [id number] DOB:03/29/1971, 53 y.o., female Today's Date: 05/31/2024  END OF SESSION:      Past Medical History:  Diagnosis Date   Agatston coronary artery calcium score less than 100    Allergic rhinitis    Allergy     Arthritis    Asthma    childhood   Chronic insomnia    Headache(784.0)    Hx of adenomatous polyp of colon 05/13/2021   diminutive x 1   Hypothyroidism 07/09/2015   Post traumatic stress disorder    Vitamin D  deficiency 07/09/2015   Past Surgical History:  Procedure Laterality Date   COLONOSCOPY W/ POLYPECTOMY  05/13/2021   REFRACTIVE SURGERY Bilateral 2010   Lasik   TOTAL KNEE ARTHROPLASTY Left 05/03/2024   Procedure: ARTHROPLASTY, KNEE, TOTAL;  Surgeon: Vernetta Lonni GRADE, MD;  Location: [facility];  Service: Orthopedics;  Laterality: Left;   WISDOM TOOTH EXTRACTION     Patient Active Problem List   Diagnosis Date Noted   Status post total left knee replacement 05/03/2024   Unilateral primary osteoarthritis, left knee 06/07/2023   HLD (hyperlipidemia) 06/05/2021   Hyperglycemia 06/01/2021   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   Depression 10/10/2020   Insect bite 10/10/2020   Right ear pain 10/08/2020   ETD (Eustachian tube dysfunction), bilateral 10/05/2020   Non-recurrent acute suppurative otitis media of right ear with spontaneous rupture of tympanic membrane 10/05/2020   Rhinitis, chronic 10/05/2020   Chronic cholecystitis 04/29/2020   Epigastric pain 12/03/2018   Snoring 10/13/2016   Asthma with exacerbation 10/13/2016   Vitamin D  deficiency 07/09/2015   Hypothyroidism 07/09/2015   Morbid obesity (HCC) 01/18/2013   Encounter for well adult exam with abnormal findings 05/02/2011   Left knee pain 05/02/2011   Seasonal and perennial allergic rhinitis 03/01/2010   SNORING 03/01/2010   Allergic-infective asthma 12/16/2008    INSOMNIA, CHRONIC 07/25/2007   PTSD 07/25/2007   Headache(784.0) 07/25/2007    PCP: Norleen Lynwood MOHR MD  REFERRING PROVIDER: Vernetta Lonni GRADE, MD  REFERRING DIAG: [phone] (ICD-10-CM) - Status post total left knee replacement  THERAPY DIAG:  No diagnosis found.  Rationale for Evaluation and Treatment: Rehabilitation  ONSET DATE: Lt TKA 05/03/2024  SUBJECTIVE:   SUBJECTIVE STATEMENT:  ***  Patient overall doing well. She is now ambulating without cane out in the community and is doing well. She hasn't attempted a long walk yet. Patient mentioned discomfort with sleep.    PERTINENT HISTORY: Arrhritis.    PAIN:  NPRS scale: 0/10 at rest and 2/10 while sleeping  Pain location: Lt knee Pain description: tightness, ache.  Aggravating factors: WB pressure, end range, static positioning.  Relieving factors: medicine, icing   PRECAUTIONS: None  WEIGHT BEARING RESTRICTIONS: No  FALLS:  Has patient fallen in last 6 months? No  LIVING ENVIRONMENT: Lives in: House/apartment Stairs: Child psychotherapist on main floor, has stairs.  2 to enter from garage.  Has following equipment at home: FWW, SPC, elevated toilet seat  OCCUPATION: Remote work from home, part time.   PLOF: Independent, walking dog for exercise.   PATIENT GOALS: Reduce pain, dog walking   OBJECTIVE:   PATIENT SURVEYS:  Patient-Specific Activity Scoring Scheme  0 represents "unable to perform." 10 represents "able to perform at prior level. 0 1 2 3 4 5 6 7 8 9  10 (Date and Score)   Activity Eval  05/22/2024    1. Dog  walking  0    2. Putting on pants  8    3. Steps  1   4.     5.    Score 3 avg    Total score = sum of the activity scores/number of activities Minimum detectable change (90%CI) for average score = 2 points Minimum detectable change (90%CI) for single activity score = 3 points  COGNITION: 05/22/2024 Overall cognitive status: WFL    SENSATION: 05/22/2024 WFl  EDEMA:   05/22/2024 Localized edema noted in Lt leg.  Lt knee joint 14.5 inch  Rt knee joint 13.2 inch  MUSCLE LENGTH: 05/22/2024 No specific testing  POSTURE:  05/22/2024 Weight shift to Rt in standing.   PALPATION: 05/22/2024 General tenderness around knee Lt.   LOWER EXTREMITY MMT:   ROM Right Eval 05/22/2024 Left Eval 05/22/2024  Hip flexion 5/5 4/5  Hip extension    Hip abduction    Hip adduction    Hip internal rotation    Hip external rotation    Knee flexion 5/5 3+/5  Knee extension 5/5 4/5  Ankle dorsiflexion    Ankle plantarflexion    Ankle inversion    Ankle eversion     (Blank rows = not tested)  LOWER EXTREMITY ROM:  ROM Right Eval 05/22/2024 Left Eval 05/22/2024 Left 05/27/2024  Hip flexion     Hip extension     Hip abduction     Hip adduction     Hip internal rotation     Hip external rotation     Knee flexion   Supine AROM heel slide 90  Knee extension  AROM in seated LAQ -16  Passive heel prop  -9 AROM in setaed LAQ -5  -4 passive heel prop  Ankle dorsiflexion     Ankle plantarflexion     Ankle inversion     Ankle eversion      (Blank rows = not tested)  LOWER EXTREMITY SPECIAL TESTS:  05/22/2024 No specific testing.   FUNCTIONAL TESTS:  05/22/2024 18 inch chair transfer: no UE but Rt leg dominant movement.  Lt SLS: unable  Rt SLS: not tested  GAIT: 05/27/2024: SPC use in clinic with mod independence.  Able to demonstrate independent ambulation as well.   05/22/2024 FWW use in clinic household distances.  Reduced stance on Lt, lacking TKE in stance.                                                                                                                                                                         TODAY'S TREATMENT  06/03/2024 ***    DATE: 05/30/2024 Therex:  Recumbent bike, seat 6 for 8 minutes, half circles for ROM BL leg press 62#, 2x10.   SL leg press 31#, 2x10 Step ups/down with L LE leading, 3x8. Minimal discomfort and initial trouble with eccentric control on the descent. Seated Lt leg LAQ w/ 2#, 1x10 w/ 5 second hold. Contralateral leg movement opposite  Heel slides   Neuro Re-ed: Tandem fwd/bwd walk. Initially on floor with B UE, L UE, and no UE. Progressed on foam with B UE, L UE, and no UE. 2x each. Most difficult was on foam w/ no UE Cone taps: 8x w/ B UE, 8x w/ L UE, and 2x8 w/ no UE. Patient reports it was difficult but no pain  Manual: Distraction/IR mobilization with overpressure into knee flexion. Contract, relax technique, 6x  Vaso 10 mins Lt knee in elevation medium compression 34 degrees  TREATMENT                                                                          DATE: 05/27/2024 Therex: Recumbent bike partial circles, seat 6 8 mins for ROM Incline gastroc stretch 30 sec x 3 bilateral  Seated tailgate flexion 1 min Seated Lt leg LAQ 2 lb weight 2 x 10 with pause in end range flexion and extension, contralateral leg movement opposite.  Supine heel slide AROM 5 sec hold x 10    Neuro Re-ed Tandem stance balance 1 min x 1 bilateral occasional HHA Heel /toe lifts alternating with single hand on bar x 15 each way   Manual Lt knee flexion c distraction/IR mobilization c movement c contralateral leg opposite.    Gait Training SPC verbal cues for placement and sequencing.  Trial in Clinic household distances throughout visit.  Gait training for knee flexion in swing by step over and back with forward and retro step weight shift 4 inch step x 10 bilateral   Vaso 10 mins Lt knee in elevation medium compression 34 degrees.  Performed with self care education.   TODAY'S TREATMENT                                                                          DATE: 05/22/2024 Therex:    HEP instruction/performance c cues for techniques, handout provided.  Trial set performed of each for comprehension and  symptom assessment.  See below for exercise list  Self care Education on edema control with elevation, ankle pumps and icing to help reduced swelling.  Discussion about routine mobility intervention to promote gains in mobility and manage stiffness complaints.   Vaso 10 mins Lt knee in elevation medium compression 34 degrees.  Performed with self care education.   PATIENT EDUCATION:  05/22/2024 Education details: HEP, POC Person educated: Patient Education method: Programmer, multimedia, Demonstration, Verbal cues, and Handouts Education comprehension: verbalized understanding, returned demonstration, and verbal cues required  HOME EXERCISE PROGRAM: Access Code: [email or web address] URL: [email or web address] Date: 05/22/2024 Prepared  by: Ozell Silvan  Exercises - Supine Heel Slide (Mirrored)  - 3-5 x daily - 7 x weekly - 1 sets - 10 reps - 5 hold - Supine Heel Slide with Strap  - 3-5 x daily - 7 x weekly - 1 sets - 10 reps - 5 hold - Seated Long Arc Quad (Mirrored)  - 3-5 x daily - 7 x weekly - 1 sets - 5-10 reps - 2 hold - Supine Knee Extension Mobilization with Weight (Mirrored)  - 4-5 x daily - 7 x weekly - 1 sets - 1 reps - to tolerance up to 5 mins hold - Seated Quad Set (Mirrored)  - 3-5 x daily - 7 x weekly - 1 sets - 10 reps - 5 hold - Seated Knee Flexion Extension AAROM with Overpressure (Mirrored)  - 2-3 x daily - 7 x weekly - 1 sets - 5 reps - 10 hold  ASSESSMENT:  CLINICAL IMPRESSION:  ***  Patient is successful walking without a cane.  Patient will continue to benefit from exercises that challenge eccentric control of the quads for functional activities such as stairs.  Patient responded well to manual technique to gain flexion.  Patient will continue to benefit from skilled physical therapy to address impairments.  OBJECTIVE IMPAIRMENTS: Abnormal gait, decreased activity tolerance, decreased balance, decreased coordination, decreased endurance, decreased mobility,  difficulty walking, decreased ROM, decreased strength, hypomobility, increased edema, increased fascial restrictions, impaired perceived functional ability, increased muscle spasms, impaired flexibility, improper body mechanics, and pain.   ACTIVITY LIMITATIONS: carrying, lifting, bending, sitting, standing, squatting, sleeping, stairs, transfers, bed mobility, bathing, toileting, dressing, hygiene/grooming, and locomotion level  PARTICIPATION LIMITATIONS: meal prep, cleaning, laundry, interpersonal relationship, driving, shopping, community activity, occupation, and yard work  PERSONAL FACTORS: no specific factors are also affecting patient's functional outcome.   REHAB POTENTIAL: Good  CLINICAL DECISION MAKING: Stable/uncomplicated  EVALUATION COMPLEXITY: Low   GOALS: Goals reviewed with patient? Yes  SHORT TERM GOALS: (target date for Short term goals are 3 weeks 06/12/2024)   1.  Patient will demonstrate independent use of home exercise program to maintain progress from in clinic treatments.  Goal status: on going 05/27/2024  LONG TERM GOALS: (target dates for all long term goals are 10 weeks  07/31/2024 )   1. Patient will demonstrate/report pain at worst less than or equal to 2/10 to facilitate minimal limitation in daily activity secondary to pain symptoms.  Goal status: New   2. Patient will demonstrate independent use of home exercise program to facilitate ability to maintain/progress functional gains from skilled physical therapy services.  Goal status: New   3. Patient will demonstrate Patient specific functional scale avg > or = 8/10 to indicate reduced disability due to condition.   Goal status: New   4.  Patient will demonstrate Lt  LE MMT 5/5 throughout to faciltiate usual transfers, stairs, squatting at [facility] for daily life.   Goal status: New   5.  Patient will demonstrate Lt knee AROM 0-110 deg to facilitate mobility for transfers, walking, stairs at PLOF.   Goal status: New   6. Patient will demonstrate ascending/descending stairs reciprocally s UE assist for community integration.   Goal status: New   7.  Patient will demonstrate independent ambulation > 500 ft for community ambulation , dog walking at PLOF.  Goal Status: New   PLAN:  PT FREQUENCY: 1-2x/week  PT DURATION: 10 weeks  PLANNED INTERVENTIONS: Can include 02853- PT Re-evaluation, 97110-Therapeutic exercises, 97530- Therapeutic activity, [id number]- Neuromuscular  re-education, [phone]- Self Care, 02859- Manual therapy, [phone]- Gait training, [phone]- Orthotic Fit/training, [phone]- Canalith repositioning, [id number]- Aquatic Therapy, [phone]- Electrical stimulation (unattended), [id number] Physical performance testing, [id number]- Vasopneumatic device, [id number]- Ultrasound, [id number]- Traction (mechanical), [id number]- Ionotophoresis 4mg /ml Dexamethasone ,  [id number] - Needle insertion w/o injection 1 or 2 muscles, 20561 - Needle insertion w/o injection 3 or more muscles.   Patient/Family education, Balance training, Stair training, Taping, Dry Needling, Joint mobilization, Joint manipulation, Spinal manipulation, Spinal mobilization, Scar mobilization, Vestibular training, Visual/preceptual remediation/compensation, DME instructions, Cryotherapy, and Moist heat.  All performed as medically necessary.  All included unless contraindicated  PLAN FOR NEXT SESSION: Flexion mobility gains. Eccentric control of quads exercise.   Susannah Daring, PT, DPT 05/31/24 12:22 PM

## 2024-06-03 ENCOUNTER — Encounter

## 2024-06-05 ENCOUNTER — Ambulatory Visit

## 2024-06-05 DIAGNOSIS — M25662 Stiffness of left knee, not elsewhere classified: Secondary | ICD-10-CM

## 2024-06-05 DIAGNOSIS — M25562 Pain in left knee: Secondary | ICD-10-CM

## 2024-06-05 DIAGNOSIS — R262 Difficulty in walking, not elsewhere classified: Secondary | ICD-10-CM

## 2024-06-05 DIAGNOSIS — R600 Localized edema: Secondary | ICD-10-CM

## 2024-06-05 DIAGNOSIS — G8929 Other chronic pain: Secondary | ICD-10-CM

## 2024-06-05 DIAGNOSIS — M6281 Muscle weakness (generalized): Secondary | ICD-10-CM | POA: Diagnosis not present

## 2024-06-05 NOTE — Therapy (Signed)
 OUTPATIENT PHYSICAL THERAPY  TREATMENT   Patient Name: Laurie Solomon MRN: [id number] DOB:1970-12-24, 53 y.o., female Today's Date: 06/05/2024  END OF SESSION:  PT End of Session - 06/05/24 1654     Visit Number 4    Number of Visits 20    Date for PT Re-Evaluation 07/31/24    Authorization Type [employer] $52 copay    PT Start Time 1520    PT Stop Time 1612    PT Time Calculation (min) 52 min    Activity Tolerance Patient tolerated treatment well    Behavior During Therapy [facility] for tasks assessed/performed             Past Medical History:  Diagnosis Date   Agatston coronary artery calcium score less than 100    Allergic rhinitis    Allergy     Arthritis    Asthma    childhood   Chronic insomnia    Headache(784.0)    Hx of adenomatous polyp of colon 05/13/2021   diminutive x 1   Hypothyroidism 07/09/2015   Post traumatic stress disorder    Vitamin D  deficiency 07/09/2015   Past Surgical History:  Procedure Laterality Date   COLONOSCOPY W/ POLYPECTOMY  05/13/2021   REFRACTIVE SURGERY Bilateral 2010   Lasik   TOTAL KNEE ARTHROPLASTY Left 05/03/2024   Procedure: ARTHROPLASTY, KNEE, TOTAL;  Surgeon: Vernetta Lonni GRADE, MD;  Location: [facility];  Service: Orthopedics;  Laterality: Left;   WISDOM TOOTH EXTRACTION     Patient Active Problem List   Diagnosis Date Noted   Status post total left knee replacement 05/03/2024   Unilateral primary osteoarthritis, left knee 06/07/2023   HLD (hyperlipidemia) 06/05/2021   Hyperglycemia 06/01/2021   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   Depression 10/10/2020   Insect bite 10/10/2020   Right ear pain 10/08/2020   ETD (Eustachian tube dysfunction), bilateral 10/05/2020   Non-recurrent acute suppurative otitis media of right ear with spontaneous rupture of tympanic membrane 10/05/2020   Rhinitis, chronic 10/05/2020   Chronic cholecystitis 04/29/2020   Epigastric pain 12/03/2018    Snoring 10/13/2016   Asthma with exacerbation 10/13/2016   Vitamin D  deficiency 07/09/2015   Hypothyroidism 07/09/2015   Morbid obesity (HCC) 01/18/2013   Encounter for well adult exam with abnormal findings 05/02/2011   Left knee pain 05/02/2011   Seasonal and perennial allergic rhinitis 03/01/2010   SNORING 03/01/2010   Allergic-infective asthma 12/16/2008   INSOMNIA, CHRONIC 07/25/2007   PTSD 07/25/2007   Headache(784.0) 07/25/2007    PCP: Norleen Lynwood MOHR MD  REFERRING PROVIDER: Vernetta Lonni GRADE, MD  REFERRING DIAG: [phone] (ICD-10-CM) - Status post total left knee replacement  THERAPY DIAG:  Chronic pain of left knee  Muscle weakness (generalized)  Difficulty in walking, not elsewhere classified  Stiffness of left knee, not elsewhere classified  Localized edema  Rationale for Evaluation and Treatment: Rehabilitation  ONSET DATE: Lt TKA 05/03/2024  SUBJECTIVE:   SUBJECTIVE STATEMENT:  Pt reports steps are still difficult but dressing is better.  PERTINENT HISTORY: Arrhritis.    PAIN:  NPRS scale: 0/10 at rest and 2/10 with transfers and bed mobility Pain location: Lt knee Pain description: tightness, ache.  Aggravating factors: WB pressure, end range, static positioning.  Relieving factors: medicine, icing   PRECAUTIONS: None  WEIGHT BEARING RESTRICTIONS: No  FALLS:  Has patient fallen in last 6 months? No  LIVING ENVIRONMENT: Lives in: House/apartment Stairs: Child psychotherapist on main floor, has stairs.  2  to enter from garage.  Has following equipment at home: FWW, SPC, elevated toilet seat  OCCUPATION: Remote work from home, part time.   PLOF: Independent, walking dog for exercise.   PATIENT GOALS: Reduce pain, dog walking   OBJECTIVE:   PATIENT SURVEYS:  Patient-Specific Activity Scoring Scheme  0 represents "unable to perform." 10 represents "able to perform at prior level. 0 1 2 3 4 5 6 7 8 9  10 (Date and Score)   Activity Eval   05/22/2024    1. Dog walking  0    2. Putting on pants  8    3. Steps  1   4.     5.    Score 3 avg    Total score = sum of the activity scores/number of activities Minimum detectable change (90%CI) for average score = 2 points Minimum detectable change (90%CI) for single activity score = 3 points  COGNITION: 05/22/2024 Overall cognitive status: WFL    SENSATION: 05/22/2024 WFl  EDEMA:  05/22/2024 Localized edema noted in Lt leg.  Lt knee joint 14.5 inch  Rt knee joint 13.2 inch  MUSCLE LENGTH: 05/22/2024 No specific testing  POSTURE:  05/22/2024 Weight shift to Rt in standing.   PALPATION: 05/22/2024 General tenderness around knee Lt.   LOWER EXTREMITY MMT:   ROM Right Eval 05/22/2024 Left Eval 05/22/2024  Hip flexion 5/5 4/5  Hip extension    Hip abduction    Hip adduction    Hip internal rotation    Hip external rotation    Knee flexion 5/5 3+/5  Knee extension 5/5 4/5  Ankle dorsiflexion    Ankle plantarflexion    Ankle inversion    Ankle eversion     (Blank rows = not tested)  LOWER EXTREMITY ROM:  ROM Right Eval 05/22/2024 Left Eval 05/22/2024 Left 05/27/2024  Hip flexion     Hip extension     Hip abduction     Hip adduction     Hip internal rotation     Hip external rotation     Knee flexion   Supine AROM heel slide 90  Knee extension  AROM in seated LAQ -16  Passive heel prop  -9 AROM in setaed LAQ -5  -4 passive heel prop  Ankle dorsiflexion     Ankle plantarflexion     Ankle inversion     Ankle eversion      (Blank rows = not tested)  LOWER EXTREMITY SPECIAL TESTS:  05/22/2024 No specific testing.   FUNCTIONAL TESTS:  05/22/2024 18 inch chair transfer: no UE but Rt leg dominant movement.  Lt SLS: unable  Rt SLS: not tested  GAIT: 05/27/2024: SPC use in clinic with mod independence.  Able to demonstrate independent ambulation as well.   05/22/2024 FWW use in clinic household distances.  Reduced stance on Lt, lacking TKE  in stance.  TODAY'S TREATMENT                                                                           06/05/2024 [id number] Therex:   Nustep Level 2 7.5 minutes BL leg press 62#, 3x10.  SL leg press 31#, 2x10 Seated Lt leg LAQ w/ 2#, 1x10 w/ 5 second hold. Contralateral leg movement opposite  Heel slides  Neuro Re-ed: Step ups/down with L LE leading on 6 inch step Step downs off 6 inch step with R LE leading Cone taps: 8x w/ B UE, 8x w/ L UE, and 2x8 w/ no UE.  Manual:  PROM for flexion in supine  DATE: 05/30/2024 Therex:  Recumbent bike, seat 6 for 8 minutes, half circles for ROM BL leg press 62#, 2x10.  SL leg press 31#, 2x10 Step ups/down with L LE leading, 3x8. Minimal discomfort and initial trouble with eccentric control on the descent. Seated Lt leg LAQ w/ 2#, 1x10 w/ 5 second hold. Contralateral leg movement opposite  Heel slides   Neuro Re-ed: Tandem fwd/bwd walk. Initially on floor with B UE, L UE, and no UE. Progressed on foam with B UE, L UE, and no UE. 2x each. Most difficult was on foam w/ no UE Cone taps: 8x w/ B UE, 8x w/ L UE, and 2x8 w/ no UE. Patient reports it was difficult but no pain  Manual: Distraction/IR mobilization with overpressure into knee flexion. Contract, relax technique, 6x  Vaso 10 mins Lt knee in elevation medium compression 34 degrees  TREATMENT                                                                          DATE: 05/27/2024 Therex: Recumbent bike partial circles, seat 6 8 mins for ROM Incline gastroc stretch 30 sec x 3 bilateral  Seated tailgate flexion 1 min Seated Lt leg LAQ 2 lb weight 2 x 10 with pause in end range flexion and extension, contralateral leg movement opposite.  Supine heel slide AROM 5 sec hold x 10    Neuro Re-ed Tandem stance balance 1 min x  1 bilateral occasional HHA Heel /toe lifts alternating with single hand on bar x 15 each way   Manual Lt knee flexion c distraction/IR mobilization c movement c contralateral leg opposite.    Gait Training SPC verbal cues for placement and sequencing.  Trial in Clinic household distances throughout visit.  Gait training for knee flexion in swing by step over and back with forward and retro step weight shift 4 inch step x 10 bilateral   Vaso 10 mins Lt knee in elevation medium compression 34 degrees.  Performed with self care education.   TODAY'S TREATMENT  DATE: 05/22/2024 Therex:    HEP instruction/performance c cues for techniques, handout provided.  Trial set performed of each for comprehension and symptom assessment.  See below for exercise list  Self care Education on edema control with elevation, ankle pumps and icing to help reduced swelling.  Discussion about routine mobility intervention to promote gains in mobility and manage stiffness complaints.   Vaso 10 mins Lt knee in elevation medium compression 34 degrees.  Performed with self care education.   PATIENT EDUCATION:  05/22/2024 Education details: HEP, POC Person educated: Patient Education method: Programmer, multimedia, Demonstration, Verbal cues, and Handouts Education comprehension: verbalized understanding, returned demonstration, and verbal cues required  HOME EXERCISE PROGRAM: Access Code: [email or web address] URL: [email or web address] Date: 05/22/2024 Prepared by: Ozell Silvan  Exercises - Supine Heel Slide (Mirrored)  - 3-5 x daily - 7 x weekly - 1 sets - 10 reps - 5 hold - Supine Heel Slide with Strap  - 3-5 x daily - 7 x weekly - 1 sets - 10 reps - 5 hold - Seated Long Arc Quad (Mirrored)  - 3-5 x daily - 7 x weekly - 1 sets - 5-10 reps - 2 hold - Supine Knee Extension Mobilization with Weight (Mirrored)  - 4-5 x daily - 7 x weekly - 1  sets - 1 reps - to tolerance up to 5 mins hold - Seated Quad Set (Mirrored)  - 3-5 x daily - 7 x weekly - 1 sets - 10 reps - 5 hold - Seated Knee Flexion Extension AAROM with Overpressure (Mirrored)  - 2-3 x daily - 7 x weekly - 1 sets - 5 reps - 10 hold  ASSESSMENT:  CLINICAL IMPRESSION:  Pt needed handrail for step downs off 6 inch step due to stiffness in affected knee. Able to easily step down off 4 inch step without need for hand assist.   OBJECTIVE IMPAIRMENTS: Abnormal gait, decreased activity tolerance, decreased balance, decreased coordination, decreased endurance, decreased mobility, difficulty walking, decreased ROM, decreased strength, hypomobility, increased edema, increased fascial restrictions, impaired perceived functional ability, increased muscle spasms, impaired flexibility, improper body mechanics, and pain.   ACTIVITY LIMITATIONS: carrying, lifting, bending, sitting, standing, squatting, sleeping, stairs, transfers, bed mobility, bathing, toileting, dressing, hygiene/grooming, and locomotion level  PARTICIPATION LIMITATIONS: meal prep, cleaning, laundry, interpersonal relationship, driving, shopping, community activity, occupation, and yard work  PERSONAL FACTORS: no specific factors are also affecting patient's functional outcome.   REHAB POTENTIAL: Good  CLINICAL DECISION MAKING: Stable/uncomplicated  EVALUATION COMPLEXITY: Low   GOALS: Goals reviewed with patient? Yes  SHORT TERM GOALS: (target date for Short term goals are 3 weeks 06/12/2024)   1.  Patient will demonstrate independent use of home exercise program to maintain progress from in clinic treatments.  Goal status: on going 05/27/2024  LONG TERM GOALS: (target dates for all long term goals are 10 weeks  07/31/2024 )   1. Patient will demonstrate/report pain at worst less than or equal to 2/10 to facilitate minimal limitation in daily activity secondary to pain symptoms.  Goal status: New   2.  Patient will demonstrate independent use of home exercise program to facilitate ability to maintain/progress functional gains from skilled physical therapy services.  Goal status: New   3. Patient will demonstrate Patient specific functional scale avg > or = 8/10 to indicate reduced disability due to condition.   Goal status: New   4.  Patient will demonstrate Lt  LE MMT 5/5 throughout to faciltiate usual transfers, stairs, squatting at [facility] for  daily life.   Goal status: New   5.  Patient will demonstrate Lt knee AROM 0-110 deg to facilitate mobility for transfers, walking, stairs at PLOF.  Goal status: New   6. Patient will demonstrate ascending/descending stairs reciprocally s UE assist for community integration.   Goal status: New   7.  Patient will demonstrate independent ambulation > 500 ft for community ambulation , dog walking at PLOF.  Goal Status: New   PLAN:  PT FREQUENCY: 1-2x/week  PT DURATION: 10 weeks  PLANNED INTERVENTIONS: Can include 02853- PT Re-evaluation, 97110-Therapeutic exercises, 97530- Therapeutic activity, [id number]- Neuromuscular re-education, 97535- Self Care, 97140- Manual therapy, [phone]- Gait training, [phone]- Orthotic Fit/training, [phone]- Canalith repositioning, [id number]- Aquatic Therapy, [phone]- Electrical stimulation (unattended), [id number] Physical performance testing, 97016- Vasopneumatic device, [id number]- Ultrasound, [id number]- Traction (mechanical), [id number]- Ionotophoresis 4mg /ml Dexamethasone ,  [id number] - Needle insertion w/o injection 1 or 2 muscles, 20561 - Needle insertion w/o injection 3 or more muscles.   Patient/Family education, Balance training, Stair training, Taping, Dry Needling, Joint mobilization, Joint manipulation, Spinal manipulation, Spinal mobilization, Scar mobilization, Vestibular training, Visual/preceptual remediation/compensation, DME instructions, Cryotherapy, and Moist heat.  All performed as medically necessary.  All included unless  contraindicated  PLAN FOR NEXT SESSION: Flexion mobility exercises.   Burnard Meth, PT 06/05/24  4:57 PM

## 2024-06-10 ENCOUNTER — Encounter: Payer: Self-pay | Admitting: Rehabilitative and Restorative Service Providers"

## 2024-06-10 ENCOUNTER — Telehealth: Payer: Self-pay | Admitting: Orthopaedic Surgery

## 2024-06-10 ENCOUNTER — Ambulatory Visit: Admitting: Rehabilitative and Restorative Service Providers"

## 2024-06-10 DIAGNOSIS — M25562 Pain in left knee: Secondary | ICD-10-CM | POA: Diagnosis not present

## 2024-06-10 DIAGNOSIS — R600 Localized edema: Secondary | ICD-10-CM

## 2024-06-10 DIAGNOSIS — M25662 Stiffness of left knee, not elsewhere classified: Secondary | ICD-10-CM | POA: Diagnosis not present

## 2024-06-10 DIAGNOSIS — R262 Difficulty in walking, not elsewhere classified: Secondary | ICD-10-CM | POA: Diagnosis not present

## 2024-06-10 DIAGNOSIS — M6281 Muscle weakness (generalized): Secondary | ICD-10-CM | POA: Diagnosis not present

## 2024-06-10 DIAGNOSIS — G8929 Other chronic pain: Secondary | ICD-10-CM

## 2024-06-10 NOTE — Telephone Encounter (Signed)
 Pt came in stating she did stop by desk to make a 4 wk post op but line was long and couldn't wait. Made appt for 8/25 but need 4 wk post op which would be around 8/11. Please call pt at [phone].

## 2024-06-10 NOTE — Therapy (Signed)
 OUTPATIENT PHYSICAL THERAPY  TREATMENT   Patient Name: Laurie Solomon MRN: [id number] DOB:05-11-1971, 53 y.o., female Today's Date: 06/10/2024  END OF SESSION:  PT End of Session - 06/10/24 1345     Visit Number 5    Number of Visits 20    Date for PT Re-Evaluation 07/31/24    Authorization Type [employer] $52 copay    PT Start Time 1305    PT Stop Time 1353    PT Time Calculation (min) 48 min    Activity Tolerance Patient tolerated treatment well;No increased pain;Patient limited by pain    Behavior During Therapy [facility] for tasks assessed/performed              Past Medical History:  Diagnosis Date   Agatston coronary artery calcium score less than 100    Allergic rhinitis    Allergy     Arthritis    Asthma    childhood   Chronic insomnia    Headache(784.0)    Hx of adenomatous polyp of colon 05/13/2021   diminutive x 1   Hypothyroidism 07/09/2015   Post traumatic stress disorder    Vitamin D  deficiency 07/09/2015   Past Surgical History:  Procedure Laterality Date   COLONOSCOPY W/ POLYPECTOMY  05/13/2021   REFRACTIVE SURGERY Bilateral 2010   Lasik   TOTAL KNEE ARTHROPLASTY Left 05/03/2024   Procedure: ARTHROPLASTY, KNEE, TOTAL;  Surgeon: Vernetta Lonni GRADE, MD;  Location: [facility];  Service: Orthopedics;  Laterality: Left;   WISDOM TOOTH EXTRACTION     Patient Active Problem List   Diagnosis Date Noted   Status post total left knee replacement 05/03/2024   Unilateral primary osteoarthritis, left knee 06/07/2023   HLD (hyperlipidemia) 06/05/2021   Hyperglycemia 06/01/2021   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   Depression 10/10/2020   Insect bite 10/10/2020   Right ear pain 10/08/2020   ETD (Eustachian tube dysfunction), bilateral 10/05/2020   Non-recurrent acute suppurative otitis media of right ear with spontaneous rupture of tympanic membrane 10/05/2020   Rhinitis, chronic 10/05/2020   Chronic cholecystitis  04/29/2020   Epigastric pain 12/03/2018   Snoring 10/13/2016   Asthma with exacerbation 10/13/2016   Vitamin D  deficiency 07/09/2015   Hypothyroidism 07/09/2015   Morbid obesity (HCC) 01/18/2013   Encounter for well adult exam with abnormal findings 05/02/2011   Left knee pain 05/02/2011   Seasonal and perennial allergic rhinitis 03/01/2010   SNORING 03/01/2010   Allergic-infective asthma 12/16/2008   INSOMNIA, CHRONIC 07/25/2007   PTSD 07/25/2007   Headache(784.0) 07/25/2007    PCP: Norleen Lynwood MOHR MD  REFERRING PROVIDER: Vernetta Lonni GRADE, MD  REFERRING DIAG: [phone] (ICD-10-CM) - Status post total left knee replacement  THERAPY DIAG:  Chronic pain of left knee  Muscle weakness (generalized)  Difficulty in walking, not elsewhere classified  Stiffness of left knee, not elsewhere classified  Localized edema  Rationale for Evaluation and Treatment: Rehabilitation  ONSET DATE: Lt TKA 05/03/2024  SUBJECTIVE:   SUBJECTIVE STATEMENT:  Laurie Solomon can sleep with meds.  Descending stairs and getting in and out of the car are challenging.    PERTINENT HISTORY: Arrhritis.    PAIN:  NPRS scale: 0-5/10 this week Pain location: Lt knee Pain description: tightness, ache.  Aggravating factors: WB pressure, end range flexion, static positioning.  Relieving factors: medicine, Ice   PRECAUTIONS: None  WEIGHT BEARING RESTRICTIONS: No  FALLS:  Has patient fallen in last 6 months? No  LIVING ENVIRONMENT: Lives in:  House/apartment Stairs: Child psychotherapist on main floor, has stairs.  2 to enter from garage.  Has following equipment at home: FWW, SPC, elevated toilet seat  OCCUPATION: Remote work from home, part time.   PLOF: Independent, walking dog for exercise.   PATIENT GOALS: Reduce pain, dog walking   OBJECTIVE:   PATIENT SURVEYS:  Patient-Specific Activity Scoring Scheme  0 represents "unable to perform." 10 represents "able to perform at prior level. 0 1 2 3 4 5  6 7 8 9 10  (Date and Score)   Activity Eval  05/22/2024    1. Dog walking  0    2. Putting on pants  8    3. Steps  1   4.     5.    Score 3 avg    Total score = sum of the activity scores/number of activities Minimum detectable change (90%CI) for average score = 2 points Minimum detectable change (90%CI) for single activity score = 3 points  COGNITION: 05/22/2024 Overall cognitive status: WFL    SENSATION: 05/22/2024 WFl  EDEMA:  05/22/2024 Localized edema noted in Lt leg.  Lt knee joint 14.5 inch  Rt knee joint 13.2 inch  MUSCLE LENGTH: 05/22/2024 No specific testing  POSTURE:  05/22/2024 Weight shift to Rt in standing.   PALPATION: 05/22/2024 General tenderness around knee Lt.   LOWER EXTREMITY MMT:   ROM Right Eval 05/22/2024 Left Eval 05/22/2024  Hip flexion 5/5 4/5  Hip extension    Hip abduction    Hip adduction    Hip internal rotation    Hip external rotation    Knee flexion 5/5 3+/5  Knee extension 5/5 4/5  Ankle dorsiflexion    Ankle plantarflexion    Ankle inversion    Ankle eversion     (Blank rows = not tested)  LOWER EXTREMITY ROM:  ROM Right Eval 05/22/2024 Left Eval 05/22/2024 Left 05/27/2024 Left 06/11/2024  Hip flexion      Hip extension      Hip abduction      Hip adduction      Hip internal rotation      Hip external rotation      Knee flexion   Supine AROM heel slide 90 100 Active  Knee extension  AROM in seated LAQ -16  Passive heel prop  -9 AROM in setaed LAQ -5  -4 passive heel prop -3 Active  Ankle dorsiflexion      Ankle plantarflexion      Ankle inversion      Ankle eversion       (Blank rows = not tested)  LOWER EXTREMITY SPECIAL TESTS:  05/22/2024 No specific testing.   FUNCTIONAL TESTS:  05/22/2024 18 inch chair transfer: no UE but Rt leg dominant movement.  Lt SLS: unable  Rt SLS: not tested  GAIT: 05/27/2024: SPC use in clinic with mod independence.  Able to demonstrate independent ambulation as  well.   05/22/2024 FWW use in clinic household distances.  Reduced stance on Lt, lacking TKE in stance.  TODAY'S TREATMENT                                                                     06/10/2024 Recumbent bike Seat 8 for 5 minutes, followed by 10 revolutions of CW/CCW Quad sets with heel prop 10 x 5 seconds Seated knee flexion AAROM 10 x 10 seconds Seated straight leg raises 3 sets of 5 for 3 seconds  Functional Activities: Double Leg Press 100# 15 x slow eccentrics, full extension and stretch into flexion Single Leg Press 50# 10 x slow eccentrics, full extension and stretch into flexion  Vaso Left Knee Medium 10 minutes 34*   TODAY'S TREATMENT                                                                           06/05/2024 [id number] Therex:   Nustep Level 2 7.5 minutes BL leg press 62#, 3x10.  SL leg press 31#, 2x10 Seated Lt leg LAQ w/ 2#, 1x10 w/ 5 second hold. Contralateral leg movement opposite  Heel slides  Neuro Re-ed: Step ups/down with L LE leading on 6 inch step Step downs off 6 inch step with R LE leading Cone taps: 8x w/ B UE, 8x w/ L UE, and 2x8 w/ no UE.  Manual:  PROM for flexion in supine   DATE: 05/30/2024 Therex:  Recumbent bike, seat 6 for 8 minutes, half circles for ROM BL leg press 62#, 2x10.  SL leg press 31#, 2x10 Step ups/down with L LE leading, 3x8. Minimal discomfort and initial trouble with eccentric control on the descent. Seated Lt leg LAQ w/ 2#, 1x10 w/ 5 second hold. Contralateral leg movement opposite  Heel slides   Neuro Re-ed: Tandem fwd/bwd walk. Initially on floor with B UE, L UE, and no UE. Progressed on foam with B UE, L UE, and no UE. 2x each. Most difficult was on foam w/ no UE Cone taps: 8x w/ B UE, 8x w/ L UE, and 2x8 w/ no UE. Patient reports it was  difficult but no pain  Manual: Distraction/IR mobilization with overpressure into knee flexion. Contract, relax technique, 6x  Vaso 10 mins Lt knee in elevation medium compression 34 degrees    PATIENT EDUCATION:  05/22/2024 Education details: HEP, POC Person educated: Patient Education method: Programmer, multimedia, Demonstration, Verbal cues, and Handouts Education comprehension: verbalized understanding, returned demonstration, and verbal cues required  HOME EXERCISE PROGRAM: Access Code: [email or web address] URL: [email or web address] Date: 06/10/2024 Prepared by: Lamar Ivory  Exercises - Supine Heel Slide (Mirrored)  - 3-5 x daily - 7 x weekly - 1 sets - 10 reps - 5 hold - Supine Heel Slide with Strap  - 3-5 x daily - 7 x weekly - 1 sets - 10 reps - 5 hold - Seated Long Arc Quad (Mirrored)  - 3-5 x daily - 7 x weekly - 1 sets - 5-10 reps - 2 hold - Supine Knee Extension Mobilization with Weight (Mirrored)  - 4-5 x daily -  7 x weekly - 1 sets - 1 reps - to tolerance up to 5 mins hold - Seated Quad Set (Mirrored)  - 3-5 x daily - 7 x weekly - 1 sets - 10 reps - 5 hold - Seated Knee Flexion Extension AAROM with Overpressure (Mirrored)  - 2-3 x daily - 7 x weekly - 1 sets - 5 reps - 10 hold - Small Range Straight Leg Raise  - 2 x daily - 7 x weekly - 3-5 sets - 10 reps - 3 seconds hold  ASSESSMENT:  CLINICAL IMPRESSION:  AROM was assessed at 0 - 3 -100 degrees today.  Laurie Solomon knows flexion active range of motion, edema control and quadriceps strengthening remain high priorities with her clinic and home program.  She is doing a good job with this program and should meet the below listed long-term goals within the recommended plan of care.    OBJECTIVE IMPAIRMENTS: Abnormal gait, decreased activity tolerance, decreased balance, decreased coordination, decreased endurance, decreased mobility, difficulty walking, decreased ROM, decreased strength, hypomobility, increased edema, increased  fascial restrictions, impaired perceived functional ability, increased muscle spasms, impaired flexibility, improper body mechanics, and pain.   ACTIVITY LIMITATIONS: carrying, lifting, bending, sitting, standing, squatting, sleeping, stairs, transfers, bed mobility, bathing, toileting, dressing, hygiene/grooming, and locomotion level  PARTICIPATION LIMITATIONS: meal prep, cleaning, laundry, interpersonal relationship, driving, shopping, community activity, occupation, and yard work  PERSONAL FACTORS: no specific factors are also affecting patient's functional outcome.   REHAB POTENTIAL: Good  CLINICAL DECISION MAKING: Stable/uncomplicated  EVALUATION COMPLEXITY: Low   GOALS: Goals reviewed with patient? Yes  SHORT TERM GOALS: (target date for Short term goals are 3 weeks 06/12/2024)   1.  Patient will demonstrate independent use of home exercise program to maintain progress from in clinic treatments.  Goal status: Met 06/10/2024  LONG TERM GOALS: (target dates for all long term goals are 10 weeks  07/31/2024 )   1. Patient will demonstrate/report pain at worst less than or equal to 2/10 to facilitate minimal limitation in daily activity secondary to pain symptoms.  Goal status: Ongoing 06/10/2024   2. Patient will demonstrate independent use of home exercise program to facilitate ability to maintain/progress functional gains from skilled physical therapy services.  Goal status: New   3. Patient will demonstrate Patient specific functional scale avg > or = 8/10 to indicate reduced disability due to condition.   Goal status: New   4.  Patient will demonstrate Lt  LE MMT 5/5 throughout to faciltiate usual transfers, stairs, squatting at [facility] for daily life.   Goal status: New   5.  Patient will demonstrate Lt knee AROM 0-110 deg to facilitate mobility for transfers, walking, stairs at PLOF.  Goal status: Ongoing 06/10/2024   6. Patient will demonstrate ascending/descending stairs  reciprocally s UE assist for community integration.   Goal status: Ongoing 06/10/2024   7.  Patient will demonstrate independent ambulation > 500 ft for community ambulation , dog walking at PLOF.  Goal Status: Ongoing 06/10/2024   PLAN:  PT FREQUENCY: 1-2x/week  PT DURATION: 10 weeks  PLANNED INTERVENTIONS: Can include 02853- PT Re-evaluation, 97110-Therapeutic exercises, 97530- Therapeutic activity, 97112- Neuromuscular re-education, 97535- Self Care, 97140- Manual therapy, [phone]- Gait training, [phone]- Orthotic Fit/training, [phone]- Canalith repositioning, [id number]- Aquatic Therapy, [phone]- Electrical stimulation (unattended), [id number] Physical performance testing, 97016- Vasopneumatic device, [id number]- Ultrasound, [id number]- Traction (mechanical), [id number]- Ionotophoresis 4mg /ml Dexamethasone ,  [id number] - Needle insertion w/o injection 1 or 2 muscles, 79438 - Needle insertion w/o injection 3  or more muscles.   Patient/Family education, Balance training, Stair training, Taping, Dry Needling, Joint mobilization, Joint manipulation, Spinal manipulation, Spinal mobilization, Scar mobilization, Vestibular training, Visual/preceptual remediation/compensation, DME instructions, Cryotherapy, and Moist heat.  All performed as medically necessary.  All included unless contraindicated  PLAN FOR NEXT SESSION: Flexion active range of motion, edema control and quadriceps strength emphasis.  Functional activities for eccentric stairs if time allows.  Myer LELON Ivory PT, MPT 06/10/24  3:37 PM

## 2024-06-13 ENCOUNTER — Encounter: Payer: Self-pay | Admitting: Rehabilitative and Restorative Service Providers"

## 2024-06-13 ENCOUNTER — Other Ambulatory Visit: Payer: Self-pay | Admitting: Internal Medicine

## 2024-06-13 ENCOUNTER — Ambulatory Visit: Admitting: Rehabilitative and Restorative Service Providers"

## 2024-06-13 DIAGNOSIS — R262 Difficulty in walking, not elsewhere classified: Secondary | ICD-10-CM | POA: Diagnosis not present

## 2024-06-13 DIAGNOSIS — M25562 Pain in left knee: Secondary | ICD-10-CM

## 2024-06-13 DIAGNOSIS — M6281 Muscle weakness (generalized): Secondary | ICD-10-CM

## 2024-06-13 DIAGNOSIS — M25662 Stiffness of left knee, not elsewhere classified: Secondary | ICD-10-CM | POA: Diagnosis not present

## 2024-06-13 DIAGNOSIS — G8929 Other chronic pain: Secondary | ICD-10-CM

## 2024-06-13 DIAGNOSIS — R600 Localized edema: Secondary | ICD-10-CM

## 2024-06-13 NOTE — Therapy (Signed)
 OUTPATIENT PHYSICAL THERAPY  TREATMENT   Patient Name: Laurie Solomon MRN: [id number] DOB:1971/06/06, 53 y.o., female Today's Date: 06/13/2024  END OF SESSION:  PT End of Session - 06/13/24 1354     Visit Number 6    Number of Visits 20    Date for PT Re-Evaluation 07/31/24    Authorization Type [employer] $52 copay    PT Start Time 1355    PT Stop Time 1444    PT Time Calculation (min) 49 min    Activity Tolerance Patient tolerated treatment well;No increased pain;Patient limited by pain    Behavior During Therapy [facility] for tasks assessed/performed               Past Medical History:  Diagnosis Date   Agatston coronary artery calcium score less than 100    Allergic rhinitis    Allergy     Arthritis    Asthma    childhood   Chronic insomnia    Headache(784.0)    Hx of adenomatous polyp of colon 05/13/2021   diminutive x 1   Hypothyroidism 07/09/2015   Post traumatic stress disorder    Vitamin D  deficiency 07/09/2015   Past Surgical History:  Procedure Laterality Date   COLONOSCOPY W/ POLYPECTOMY  05/13/2021   REFRACTIVE SURGERY Bilateral 2010   Lasik   TOTAL KNEE ARTHROPLASTY Left 05/03/2024   Procedure: ARTHROPLASTY, KNEE, TOTAL;  Surgeon: Vernetta Lonni GRADE, MD;  Location: [facility];  Service: Orthopedics;  Laterality: Left;   WISDOM TOOTH EXTRACTION     Patient Active Problem List   Diagnosis Date Noted   Status post total left knee replacement 05/03/2024   Unilateral primary osteoarthritis, left knee 06/07/2023   HLD (hyperlipidemia) 06/05/2021   Hyperglycemia 06/01/2021   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   Depression 10/10/2020   Insect bite 10/10/2020   Right ear pain 10/08/2020   ETD (Eustachian tube dysfunction), bilateral 10/05/2020   Non-recurrent acute suppurative otitis media of right ear with spontaneous rupture of tympanic membrane 10/05/2020   Rhinitis, chronic 10/05/2020   Chronic cholecystitis  04/29/2020   Epigastric pain 12/03/2018   Snoring 10/13/2016   Asthma with exacerbation 10/13/2016   Vitamin D  deficiency 07/09/2015   Hypothyroidism 07/09/2015   Morbid obesity (HCC) 01/18/2013   Encounter for well adult exam with abnormal findings 05/02/2011   Left knee pain 05/02/2011   Seasonal and perennial allergic rhinitis 03/01/2010   SNORING 03/01/2010   Allergic-infective asthma 12/16/2008   INSOMNIA, CHRONIC 07/25/2007   PTSD 07/25/2007   Headache(784.0) 07/25/2007    PCP: Norleen Lynwood MOHR MD  REFERRING PROVIDER: Vernetta Lonni GRADE, MD  REFERRING DIAG: [phone] (ICD-10-CM) - Status post total left knee replacement  THERAPY DIAG:  Chronic pain of left knee  Muscle weakness (generalized)  Difficulty in walking, not elsewhere classified  Stiffness of left knee, not elsewhere classified  Localized edema  Rationale for Evaluation and Treatment: Rehabilitation  ONSET DATE: Lt TKA 05/03/2024  SUBJECTIVE:   SUBJECTIVE STATEMENT: Patient is having difficulty w/ sleeping. Some difficulty w/ standing for more than 15 minutes but can go longer with support.  PERTINENT HISTORY: Arrhritis.    PAIN:  NPRS scale: minimal to no pain Pain location: Lt knee Pain description: tightness, ache.  Aggravating factors: WB pressure, end range flexion, static positioning.  Relieving factors: medicine, Ice   PRECAUTIONS: None  WEIGHT BEARING RESTRICTIONS: No  FALLS:  Has patient fallen in last 6 months? No  LIVING ENVIRONMENT:  Lives in: House/apartment Stairs: Child psychotherapist on main floor, has stairs.  2 to enter from garage.  Has following equipment at home: FWW, SPC, elevated toilet seat  OCCUPATION: Remote work from home, part time.   PLOF: Independent, walking dog for exercise.   PATIENT GOALS: Reduce pain, dog walking   OBJECTIVE:   PATIENT SURVEYS:  Patient-Specific Activity Scoring Scheme  0 represents "unable to perform." 10 represents "able to perform  at prior level. 0 1 2 3 4 5 6 7 8 9  10 (Date and Score)   Activity Eval  05/22/2024    1. Dog walking  0    2. Putting on pants  8    3. Steps  1   4.     5.    Score 3 avg    Total score = sum of the activity scores/number of activities Minimum detectable change (90%CI) for average score = 2 points Minimum detectable change (90%CI) for single activity score = 3 points  COGNITION: 05/22/2024 Overall cognitive status: WFL    SENSATION: 05/22/2024 WFl  EDEMA:  05/22/2024 Localized edema noted in Lt leg.  Lt knee joint 14.5 inch  Rt knee joint 13.2 inch  MUSCLE LENGTH: 05/22/2024 No specific testing  POSTURE:  05/22/2024 Weight shift to Rt in standing.   PALPATION: 05/22/2024 General tenderness around knee Lt.   LOWER EXTREMITY MMT:   ROM Right Eval 05/22/2024 Left Eval 05/22/2024  Hip flexion 5/5 4/5  Hip extension    Hip abduction    Hip adduction    Hip internal rotation    Hip external rotation    Knee flexion 5/5 3+/5  Knee extension 5/5 4/5  Ankle dorsiflexion    Ankle plantarflexion    Ankle inversion    Ankle eversion     (Blank rows = not tested)  LOWER EXTREMITY ROM:  ROM Right Eval 05/22/2024 Left Eval 05/22/2024 Left 05/27/2024 Left 06/11/2024  Hip flexion      Hip extension      Hip abduction      Hip adduction      Hip internal rotation      Hip external rotation      Knee flexion   Supine AROM heel slide 90 100 Active  Knee extension  AROM in seated LAQ -16  Passive heel prop  -9 AROM in setaed LAQ -5  -4 passive heel prop -3 Active  Ankle dorsiflexion      Ankle plantarflexion      Ankle inversion      Ankle eversion       (Blank rows = not tested)  LOWER EXTREMITY SPECIAL TESTS:  05/22/2024 No specific testing.   FUNCTIONAL TESTS:  05/22/2024 18 inch chair transfer: no UE but Rt leg dominant movement.  Lt SLS: unable  Rt SLS: not tested  GAIT: 05/27/2024: SPC use in clinic with mod independence.  Able to demonstrate  independent ambulation as well.   05/22/2024 FWW use in clinic household distances.  Reduced stance on Lt, lacking TKE in stance.  TODAY'S TREATMENT                                                                     06/13/2024 TherEx Recumbent bike seat 8, level 1, partial circles with occasional full circles, for 6 minutes  Double leg press 112#, 2x15. Focus on full extension with DF Single leg press, 62#, 2x15. Focus on full extension with DF Star taps w/ UE regression on floor and foam Heel taps w/ Lt LE from 4 box, 3x8 LAQ w/ 2# on ankle w/ 3 second hold in extension, 2x10  Manual Distraction, IR, flexion w/ contract/relax technique  Vaso Lt LE elevated, 34 degrees, medium compression for 10 minutes    TODAY'S TREATMENT                                                                     06/10/2024 Recumbent bike Seat 8 for 5 minutes, followed by 10 revolutions of CW/CCW Quad sets with heel prop 10 x 5 seconds Seated knee flexion AAROM 10 x 10 seconds Seated straight leg raises 3 sets of 5 for 3 seconds  Functional Activities: Double Leg Press 100# 15 x slow eccentrics, full extension and stretch into flexion Single Leg Press 50# 10 x slow eccentrics, full extension and stretch into flexion  Vaso Left Knee Medium 10 minutes 34*   TODAY'S TREATMENT                                                                           06/05/2024 [id number] Therex:   Nustep Level 2 7.5 minutes BL leg press 62#, 3x10.  SL leg press 31#, 2x10 Seated Lt leg LAQ w/ 2#, 1x10 w/ 5 second hold. Contralateral leg movement opposite  Heel slides  Neuro Re-ed: Step ups/down with L LE leading on 6 inch step Step downs off 6 inch step with R LE leading Cone taps: 8x w/ B UE, 8x w/ L UE, and 2x8 w/ no UE.  Manual:  PROM for flexion in  supine   DATE: 05/30/2024 Therex:  Recumbent bike, seat 6 for 8 minutes, half circles for ROM BL leg press 62#, 2x10.  SL leg press 31#, 2x10 Step ups/down with L LE leading, 3x8. Minimal discomfort and initial trouble with eccentric control on the descent. Seated Lt leg LAQ w/ 2#, 1x10 w/ 5 second hold. Contralateral leg movement opposite  Heel slides   Neuro Re-ed: Tandem fwd/bwd walk. Initially on floor with B UE, L UE, and no UE. Progressed on foam with B UE, L UE, and no UE. 2x each. Most difficult was on foam w/ no UE Cone taps: 8x w/ B UE, 8x w/ L UE, and 2x8 w/ no UE. Patient reports it was  difficult but no pain  Manual: Distraction/IR mobilization with overpressure into knee flexion. Contract, relax technique, 6x  Vaso 10 mins Lt knee in elevation medium compression 34 degrees    PATIENT EDUCATION:  05/22/2024 Education details: HEP, POC Person educated: Patient Education method: Programmer, multimedia, Demonstration, Verbal cues, and Handouts Education comprehension: verbalized understanding, returned demonstration, and verbal cues required  HOME EXERCISE PROGRAM: Access Code: [email or web address] URL: [email or web address] Date: 06/10/2024 Prepared by: Lamar Ivory  Exercises - Supine Heel Slide (Mirrored)  - 3-5 x daily - 7 x weekly - 1 sets - 10 reps - 5 hold - Supine Heel Slide with Strap  - 3-5 x daily - 7 x weekly - 1 sets - 10 reps - 5 hold - Seated Long Arc Quad (Mirrored)  - 3-5 x daily - 7 x weekly - 1 sets - 5-10 reps - 2 hold - Supine Knee Extension Mobilization with Weight (Mirrored)  - 4-5 x daily - 7 x weekly - 1 sets - 1 reps - to tolerance up to 5 mins hold - Seated Quad Set (Mirrored)  - 3-5 x daily - 7 x weekly - 1 sets - 10 reps - 5 hold - Seated Knee Flexion Extension AAROM with Overpressure (Mirrored)  - 2-3 x daily - 7 x weekly - 1 sets - 5 reps - 10 hold - Small Range Straight Leg Raise  - 2 x daily - 7 x weekly - 3-5 sets - 10 reps - 3 seconds  hold  ASSESSMENT:  CLINICAL IMPRESSION: Patient overall did well today. The session today focused on knee flexion/extension gains and eccentric loading of the quads. Patient understands the importance of regaining mobility and strength of the knee. She is showing progress and is on track to achieving her goals. Patient will continue to benefit from skilled PT to address impairments.   OBJECTIVE IMPAIRMENTS: Abnormal gait, decreased activity tolerance, decreased balance, decreased coordination, decreased endurance, decreased mobility, difficulty walking, decreased ROM, decreased strength, hypomobility, increased edema, increased fascial restrictions, impaired perceived functional ability, increased muscle spasms, impaired flexibility, improper body mechanics, and pain.   ACTIVITY LIMITATIONS: carrying, lifting, bending, sitting, standing, squatting, sleeping, stairs, transfers, bed mobility, bathing, toileting, dressing, hygiene/grooming, and locomotion level  PARTICIPATION LIMITATIONS: meal prep, cleaning, laundry, interpersonal relationship, driving, shopping, community activity, occupation, and yard work  PERSONAL FACTORS: no specific factors are also affecting patient's functional outcome.   REHAB POTENTIAL: Good  CLINICAL DECISION MAKING: Stable/uncomplicated  EVALUATION COMPLEXITY: Low   GOALS: Goals reviewed with patient? Yes  SHORT TERM GOALS: (target date for Short term goals are 3 weeks 06/12/2024)   1.  Patient will demonstrate independent use of home exercise program to maintain progress from in clinic treatments.  Goal status: Met 06/10/2024  LONG TERM GOALS: (target dates for all long term goals are 10 weeks  07/31/2024 )   1. Patient will demonstrate/report pain at worst less than or equal to 2/10 to facilitate minimal limitation in daily activity secondary to pain symptoms.  Goal status: Ongoing 06/10/2024   2. Patient will demonstrate independent use of home exercise  program to facilitate ability to maintain/progress functional gains from skilled physical therapy services.  Goal status: New   3. Patient will demonstrate Patient specific functional scale avg > or = 8/10 to indicate reduced disability due to condition.   Goal status: New   4.  Patient will demonstrate Lt  LE MMT 5/5 throughout to faciltiate usual transfers, stairs, squatting at [facility] for daily life.  Goal status: New   5.  Patient will demonstrate Lt knee AROM 0-110 deg to facilitate mobility for transfers, walking, stairs at PLOF.  Goal status: Ongoing 06/10/2024   6. Patient will demonstrate ascending/descending stairs reciprocally s UE assist for community integration.   Goal status: Ongoing 06/10/2024   7.  Patient will demonstrate independent ambulation > 500 ft for community ambulation , dog walking at PLOF.  Goal Status: Ongoing 06/10/2024   PLAN:  PT FREQUENCY: 1-2x/week  PT DURATION: 10 weeks  PLANNED INTERVENTIONS: Can include 02853- PT Re-evaluation, 97110-Therapeutic exercises, 97530- Therapeutic activity, 97112- Neuromuscular re-education, 97535- Self Care, 97140- Manual therapy, [phone]- Gait training, [phone]- Orthotic Fit/training, [phone]- Canalith repositioning, [id number]- Aquatic Therapy, [phone]- Electrical stimulation (unattended), [id number] Physical performance testing, 97016- Vasopneumatic device, [id number]- Ultrasound, [id number]- Traction (mechanical), [id number]- Ionotophoresis 4mg /ml Dexamethasone ,  [id number] - Needle insertion w/o injection 1 or 2 muscles, 20561 - Needle insertion w/o injection 3 or more muscles.   Patient/Family education, Balance training, Stair training, Taping, Dry Needling, Joint mobilization, Joint manipulation, Spinal manipulation, Spinal mobilization, Scar mobilization, Vestibular training, Visual/preceptual remediation/compensation, DME instructions, Cryotherapy, and Moist heat.  All performed as medically necessary.  All included unless contraindicated  PLAN FOR NEXT  SESSION: Extension/flexion gains, eccentric loading of quads, and SL balance  Ismael Theophilus Stallion, SPT 06/13/24  2:42 PM

## 2024-06-18 ENCOUNTER — Encounter: Payer: Self-pay | Admitting: Rehabilitative and Restorative Service Providers"

## 2024-06-18 ENCOUNTER — Ambulatory Visit: Admitting: Rehabilitative and Restorative Service Providers"

## 2024-06-18 DIAGNOSIS — R262 Difficulty in walking, not elsewhere classified: Secondary | ICD-10-CM

## 2024-06-18 DIAGNOSIS — M25662 Stiffness of left knee, not elsewhere classified: Secondary | ICD-10-CM | POA: Diagnosis not present

## 2024-06-18 DIAGNOSIS — M25562 Pain in left knee: Secondary | ICD-10-CM | POA: Diagnosis not present

## 2024-06-18 DIAGNOSIS — R600 Localized edema: Secondary | ICD-10-CM

## 2024-06-18 DIAGNOSIS — M6281 Muscle weakness (generalized): Secondary | ICD-10-CM | POA: Diagnosis not present

## 2024-06-18 DIAGNOSIS — G8929 Other chronic pain: Secondary | ICD-10-CM

## 2024-06-18 NOTE — Therapy (Signed)
 OUTPATIENT PHYSICAL THERAPY  TREATMENT/ PROGRESS NOTE   Patient Name: Laurie Solomon MRN: [id number] DOB:[birth year], 53 y.o., female Today's Date: 06/18/2024  Progress Note Reporting Period 05/22/2024 to 06/18/2024  See note below for Objective Data and Assessment of Progress/Goals.      END OF SESSION:  PT End of Session - 06/18/24 1303     Visit Number 7    Number of Visits 20    Date for PT Re-Evaluation 07/31/24    Authorization Type [employer] $52 copay    Progress Note Due on Visit 17    PT Start Time 1258    PT Stop Time 1351    PT Time Calculation (min) 53 min    Activity Tolerance Patient tolerated treatment well;No increased pain    Behavior During Therapy [facility] for tasks assessed/performed                Past Medical History:  Diagnosis Date   Agatston coronary artery calcium score less than 100    Allergic rhinitis    Allergy     Arthritis    Asthma    childhood   Chronic insomnia    Headache(784.0)    Hx of adenomatous polyp of colon 05/13/2021   diminutive x 1   Hypothyroidism 07/09/2015   Post traumatic stress disorder    Vitamin D  deficiency 07/09/2015   Past Surgical History:  Procedure Laterality Date   COLONOSCOPY W/ POLYPECTOMY  05/13/2021   REFRACTIVE SURGERY Bilateral 2010   Lasik   TOTAL KNEE ARTHROPLASTY Left 05/03/2024   Procedure: ARTHROPLASTY, KNEE, TOTAL;  Surgeon: Vernetta Lonni GRADE, MD;  Location: [facility];  Service: Orthopedics;  Laterality: Left;   WISDOM TOOTH EXTRACTION     Patient Active Problem List   Diagnosis Date Noted   Status post total left knee replacement 05/03/2024   Unilateral primary osteoarthritis, left knee 06/07/2023   HLD (hyperlipidemia) 06/05/2021   Hyperglycemia 06/01/2021   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   Depression 10/10/2020   Insect bite 10/10/2020   Right ear pain 10/08/2020   ETD (Eustachian tube dysfunction), bilateral 10/05/2020   Non-recurrent  acute suppurative otitis media of right ear with spontaneous rupture of tympanic membrane 10/05/2020   Rhinitis, chronic 10/05/2020   Chronic cholecystitis 04/29/2020   Epigastric pain 12/03/2018   Snoring 10/13/2016   Asthma with exacerbation 10/13/2016   Vitamin D  deficiency 07/09/2015   Hypothyroidism 07/09/2015   Morbid obesity (HCC) 01/18/2013   Encounter for well adult exam with abnormal findings 05/02/2011   Left knee pain 05/02/2011   Seasonal and perennial allergic rhinitis 03/01/2010   SNORING 03/01/2010   Allergic-infective asthma 12/16/2008   INSOMNIA, CHRONIC 07/25/2007   PTSD 07/25/2007   Headache(784.0) 07/25/2007    PCP: Norleen Lynwood MOHR MD  REFERRING PROVIDER: Vernetta Lonni GRADE, MD  REFERRING DIAG: [phone] (ICD-10-CM) - Status post total left knee replacement  THERAPY DIAG:  Chronic pain of left knee  Muscle weakness (generalized)  Difficulty in walking, not elsewhere classified  Stiffness of left knee, not elsewhere classified  Localized edema  Rationale for Evaluation and Treatment: Rehabilitation  ONSET DATE: Lt TKA 05/03/2024  SUBJECTIVE:   SUBJECTIVE STATEMENT:  Pt indicated reduced pain complaints.  Reported having some difficulty in longer walking.  Pt indicated lifting leg into car and still be trouble.    PERTINENT HISTORY: Arrhritis.    PAIN:  NPRS scale: no pain.  Pain location: Lt knee Pain description: tightness, ache.  Aggravating factors: WB pressure, end range flexion, static positioning.  Relieving factors: medicine, Ice   PRECAUTIONS: None  WEIGHT BEARING RESTRICTIONS: No  FALLS:  Has patient fallen in last 6 months? No  LIVING ENVIRONMENT: Lives in: House/apartment Stairs: Child psychotherapist on main floor, has stairs.  2 to enter from garage.  Has following equipment at home: FWW, SPC, elevated toilet seat  OCCUPATION: Remote work from home, part time.   PLOF: Independent, walking dog for exercise.   PATIENT GOALS:  Reduce pain, dog walking   OBJECTIVE:   PATIENT SURVEYS:  Patient-Specific Activity Scoring Scheme  0 represents "unable to perform." 10 represents "able to perform at prior level. 0 1 2 3 4 5 6 7 8 9  10 (Date and Score)   Activity Eval  05/22/2024 06/18/2024   1. Dog walking  0  5  2. Putting on pants  8  10  3. Steps  1   4.     5.    Score 3 avg    Total score = sum of the activity scores/number of activities Minimum detectable change (90%CI) for average score = 2 points Minimum detectable change (90%CI) for single activity score = 3 points  COGNITION: 05/22/2024 Overall cognitive status: WFL    SENSATION: 05/22/2024 WFl  EDEMA:  05/22/2024 Localized edema noted in Lt leg.  Lt knee joint 14.5 inch  Rt knee joint 13.2 inch  MUSCLE LENGTH: 05/22/2024 No specific testing  POSTURE:  05/22/2024 Weight shift to Rt in standing.   PALPATION: 05/22/2024 General tenderness around knee Lt.   LOWER EXTREMITY MMT:   ROM Right Eval 05/22/2024 Left Eval 05/22/2024 Right 06/18/2024 Left 06/18/2024  Hip flexion 5/5 4/5    Hip extension      Hip abduction      Hip adduction      Hip internal rotation      Hip external rotation      Knee flexion 5/5 3+/5 5/5 5/5  Knee extension 5/5 4/5 5/5 43.9, 43.5 5/5 31, 31 lbs  Ankle dorsiflexion      Ankle plantarflexion      Ankle inversion      Ankle eversion       (Blank rows = not tested)  LOWER EXTREMITY ROM:  ROM Right Eval 05/22/2024 Left Eval 05/22/2024 Left 05/27/2024 Left 06/11/2024 06/18/2024 Left  Hip flexion       Hip extension       Hip abduction       Hip adduction       Hip internal rotation       Hip external rotation       Knee flexion   Supine AROM heel slide 90 100 Active 105 AROM  Knee extension  AROM in seated LAQ -16  Passive heel prop  -9 AROM in setaed LAQ -5  -4 passive heel prop -3 Active 0 in AROM seated LAQ  Ankle dorsiflexion       Ankle plantarflexion       Ankle inversion        Ankle eversion        (Blank rows = not tested)  LOWER EXTREMITY SPECIAL TESTS:  05/22/2024 No specific testing.   FUNCTIONAL TESTS:  06/18/2024  05/22/2024 18 inch chair transfer: no UE but Rt leg dominant movement.  Lt SLS: unable  Rt SLS: not tested  GAIT: 06/18/2024: independent ambulation in clinic and reported on own.   05/27/2024: SPC use in clinic with mod independence.  Able to  demonstrate independent ambulation as well.   05/22/2024 FWW use in clinic household distances.  Reduced stance on Lt, lacking TKE in stance.                                                                                                                                                                         TODAY'S TREATMENT                                                                    DATE: 06/18/2024 TherEx UBE fwd/back ROM for knee lvl 3.5 10 mins (ended up more forward) Incline gastroc stretch 30 sec x 3 bilateral   Neuro Re-ed SLS with slider fwd, side, reverse with focus on WB on single leg with light touch on slider x 8 each way bilaterally - occasional HHA on bar Standing heel/toe alternating on foam x 15 each way with occasional HHA    TherActivity Flight of stairs reciprocal gait pattern up/down with Rt hand rail going down, Lt going up for household simulation x 1 each way with SBA Leg press double leg 112 lbs x 15 slow lowering focus, single leg 56 lbs 2 x 15 bilaterally   Vaso Lt knee in elevation 10 mins medium compression 34 degrees.    TODAY'S TREATMENT                                                                    DATE: 06/13/2024 TherEx Recumbent bike seat 8, level 1, partial circles with occasional full circles, for 6 minutes  Double leg press 112#, 2x15. Focus on full extension with DF Single leg press, 62#, 2x15. Focus on full extension with DF Star taps w/ UE regression on floor and foam Heel taps w/ Lt LE from 4 box, 3x8 LAQ w/ 2# on ankle w/ 3 second hold  in extension, 2x10  Manual Distraction, IR, flexion w/ contract/relax technique  Vaso Lt LE elevated, 34 degrees, medium compression for 10 minutes    TODAY'S TREATMENT  DATE:  06/10/2024 Recumbent bike Seat 8 for 5 minutes, followed by 10 revolutions of CW/CCW Quad sets with heel prop 10 x 5 seconds Seated knee flexion AAROM 10 x 10 seconds Seated straight leg raises 3 sets of 5 for 3 seconds  Functional Activities: Double Leg Press 100# 15 x slow eccentrics, full extension and stretch into flexion Single Leg Press 50# 10 x slow eccentrics, full extension and stretch into flexion  Vaso Left Knee Medium 10 minutes 34*   TODAY'S TREATMENT                                                                          DATE: 06/05/2024 [id number] Therex:   Nustep Level 2 7.5 minutes BL leg press 62#, 3x10.  SL leg press 31#, 2x10 Seated Lt leg LAQ w/ 2#, 1x10 w/ 5 second hold. Contralateral leg movement opposite  Heel slides  Neuro Re-ed: Step ups/down with L LE leading on 6 inch step Step downs off 6 inch step with R LE leading Cone taps: 8x w/ B UE, 8x w/ L UE, and 2x8 w/ no UE.  Manual:  PROM for flexion in supine   PATIENT EDUCATION:  06/18/2024 Education details: HEP update Person educated: Patient Education method: Programmer, multimedia, Demonstration, Verbal cues, and Handouts Education comprehension: verbalized understanding, returned demonstration, and verbal cues required  HOME EXERCISE PROGRAM: Access Code: [email or web address] URL: [email or web address] Date: 06/18/2024 Prepared by: Ozell Silvan  Exercises - Supine Heel Slide (Mirrored)  - 3-5 x daily - 7 x weekly - 1 sets - 10 reps - 5 hold - Supine Heel Slide with Strap  - 3-5 x daily - 7 x weekly - 1 sets - 10 reps - 5 hold - Seated Long Arc Quad (Mirrored)  - 3-5 x daily - 7 x weekly - 1 sets - 5-10 reps - 2 hold - Supine Knee Extension Mobilization  with Weight (Mirrored)  - 4-5 x daily - 7 x weekly - 1 sets - 1 reps - to tolerance up to 5 mins hold - Seated Quad Set (Mirrored)  - 3-5 x daily - 7 x weekly - 1 sets - 10 reps - 5 hold - Seated Knee Flexion Extension AAROM with Overpressure (Mirrored)  - 2-3 x daily - 7 x weekly - 1 sets - 5 reps - 10 hold - Seated SLR (Mirrored)  - 1-2 x daily - 7 x weekly - 1-2 sets - 10-15 reps - 2 hold  ASSESSMENT:  CLINICAL IMPRESSION:  The patient has attended 7 visits over the course of treatment cycle.  Patient has reported overall improvement at 50%.  See objective data above for updated information regarding current presentation.  Pt has made improvements in range, strength and balance as noted.  Continued skilled PT services indicated to help progress towards full return to PLOF.     OBJECTIVE IMPAIRMENTS: Abnormal gait, decreased activity tolerance, decreased balance, decreased coordination, decreased endurance, decreased mobility, difficulty walking, decreased ROM, decreased strength, hypomobility, increased edema, increased fascial restrictions, impaired perceived functional ability, increased muscle spasms, impaired flexibility, improper body mechanics, and pain.   ACTIVITY LIMITATIONS: carrying, lifting, bending, sitting, standing, squatting, sleeping, stairs, transfers, bed mobility, bathing, toileting, dressing, hygiene/grooming, and locomotion level  PARTICIPATION LIMITATIONS: meal prep, cleaning, laundry, interpersonal relationship, driving, shopping, community activity, occupation, and yard work  PERSONAL FACTORS: no specific factors are also affecting patient's functional outcome.   REHAB POTENTIAL: Good  CLINICAL DECISION MAKING: Stable/uncomplicated  EVALUATION COMPLEXITY: Low   GOALS: Goals reviewed with patient? Yes  SHORT TERM GOALS: (target date for Short term goals are 3 weeks 06/12/2024)   1.  Patient will demonstrate independent use of home exercise program to maintain  progress from in clinic treatments.  Goal status: Met 06/10/2024  LONG TERM GOALS: (target dates for all long term goals are 10 weeks  07/31/2024 )   1. Patient will demonstrate/report pain at worst less than or equal to 2/10 to facilitate minimal limitation in daily activity secondary to pain symptoms.  Goal status: Ongoing 06/10/2024   2. Patient will demonstrate independent use of home exercise program to facilitate ability to maintain/progress functional gains from skilled physical therapy services.  Goal status: New   3. Patient will demonstrate Patient specific functional scale avg > or = 8/10 to indicate reduced disability due to condition.   Goal status: New   4.  Patient will demonstrate Lt  LE MMT 5/5 throughout to faciltiate usual transfers, stairs, squatting at [facility] for daily life.   Goal status: Met 06/18/2024   5.  Patient will demonstrate Lt knee AROM 0-110 deg to facilitate mobility for transfers, walking, stairs at PLOF.  Goal status: Ongoing 06/18/2024   6. Patient will demonstrate ascending/descending stairs reciprocally s UE assist for community integration.   Goal status: Ongoing 06/18/2024   7.  Patient will demonstrate independent ambulation > 500 ft for community ambulation , dog walking at PLOF.  Goal Status: Ongoing 06/18/2024   PLAN:  PT FREQUENCY: 1-2x/week  PT DURATION: 10 weeks  PLANNED INTERVENTIONS: Can include 02853- PT Re-evaluation, 97110-Therapeutic exercises, 97530- Therapeutic activity, 97112- Neuromuscular re-education, 97535- Self Care, 97140- Manual therapy, [phone]- Gait training, [phone]- Orthotic Fit/training, [phone]- Canalith repositioning, [id number]- Aquatic Therapy, [phone]- Electrical stimulation (unattended), [id number] Physical performance testing, 97016- Vasopneumatic device, [id number]- Ultrasound, [id number]- Traction (mechanical), [id number]- Ionotophoresis 4mg /ml Dexamethasone ,  [id number] - Needle insertion w/o injection 1 or 2 muscles, 20561 - Needle insertion w/o  injection 3 or more muscles.   Patient/Family education, Balance training, Stair training, Taping, Dry Needling, Joint mobilization, Joint manipulation, Spinal manipulation, Spinal mobilization, Scar mobilization, Vestibular training, Visual/preceptual remediation/compensation, DME instructions, Cryotherapy, and Moist heat.  All performed as medically necessary.  All included unless contraindicated  PLAN FOR NEXT SESSION: Continue dynamic stability improvements, strength gains.   Ozell Silvan, PT, DPT, OCS, ATC 06/18/24  1:41 PM

## 2024-06-19 ENCOUNTER — Encounter: Payer: Self-pay | Admitting: Orthopaedic Surgery

## 2024-06-19 ENCOUNTER — Ambulatory Visit (INDEPENDENT_AMBULATORY_CARE_PROVIDER_SITE_OTHER): Admitting: Orthopaedic Surgery

## 2024-06-19 DIAGNOSIS — Z96652 Presence of left artificial knee joint: Secondary | ICD-10-CM

## 2024-06-19 NOTE — Progress Notes (Signed)
 Laurie Solomon comes in today at 6 weeks status post a left total knee replacement.  She is only 53 years old but her knee was significantly arthritic.  She has been pushing her through therapy.  She reports good range of motion and strength.  On our exam today her extension is full and her flexion is to around 100 degrees.  I did recommend a steroid injection in her left knee today which could help with inflammation and even push her flexion further as the swelling comes down.  She agreed to this and tolerated the steroid injection well.  I am fine with her trying any kind of scar creams and lotions or tapes for her incision as well.  She is fair skinned and certainly has more of a scar given her melanin content.  Will see her back in 6 weeks to assess her motion I would like a standing AP and lateral of her left operative knee at that visit.

## 2024-06-20 ENCOUNTER — Ambulatory Visit

## 2024-06-20 DIAGNOSIS — M6281 Muscle weakness (generalized): Secondary | ICD-10-CM

## 2024-06-20 DIAGNOSIS — M25662 Stiffness of left knee, not elsewhere classified: Secondary | ICD-10-CM | POA: Diagnosis not present

## 2024-06-20 DIAGNOSIS — M25562 Pain in left knee: Secondary | ICD-10-CM | POA: Diagnosis not present

## 2024-06-20 DIAGNOSIS — R262 Difficulty in walking, not elsewhere classified: Secondary | ICD-10-CM

## 2024-06-20 DIAGNOSIS — G8929 Other chronic pain: Secondary | ICD-10-CM

## 2024-06-20 DIAGNOSIS — R600 Localized edema: Secondary | ICD-10-CM | POA: Diagnosis not present

## 2024-06-20 NOTE — Therapy (Signed)
 OUTPATIENT PHYSICAL THERAPY  TREATMENT NOTE   Patient Name: Laurie Solomon MRN: [id number] DOB:11-11-1970, 53 y.o., female Today's Date: 06/20/2024      END OF SESSION:  PT End of Session - 06/20/24 1703     Visit Number 8    Number of Visits 20    Date for PT Re-Evaluation 07/31/24    Authorization Type [employer] $52 copay    Progress Note Due on Visit 17    PT Start Time 1300    PT Stop Time 1348    PT Time Calculation (min) 48 min                 Past Medical History:  Diagnosis Date   Agatston coronary artery calcium score less than 100    Allergic rhinitis    Allergy     Arthritis    Asthma    childhood   Chronic insomnia    Headache(784.0)    Hx of adenomatous polyp of colon 05/13/2021   diminutive x 1   Hypothyroidism 07/09/2015   Post traumatic stress disorder    Vitamin D  deficiency 07/09/2015   Past Surgical History:  Procedure Laterality Date   COLONOSCOPY W/ POLYPECTOMY  05/13/2021   REFRACTIVE SURGERY Bilateral 2010   Lasik   TOTAL KNEE ARTHROPLASTY Left 05/03/2024   Procedure: ARTHROPLASTY, KNEE, TOTAL;  Surgeon: Vernetta Lonni GRADE, MD;  Location: [facility];  Service: Orthopedics;  Laterality: Left;   WISDOM TOOTH EXTRACTION     Patient Active Problem List   Diagnosis Date Noted   Status post total left knee replacement 05/03/2024   Unilateral primary osteoarthritis, left knee 06/07/2023   HLD (hyperlipidemia) 06/05/2021   Hyperglycemia 06/01/2021   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   Depression 10/10/2020   Insect bite 10/10/2020   Right ear pain 10/08/2020   ETD (Eustachian tube dysfunction), bilateral 10/05/2020   Non-recurrent acute suppurative otitis media of right ear with spontaneous rupture of tympanic membrane 10/05/2020   Rhinitis, chronic 10/05/2020   Chronic cholecystitis 04/29/2020   Epigastric pain 12/03/2018   Snoring 10/13/2016   Asthma with exacerbation 10/13/2016   Vitamin  D deficiency 07/09/2015   Hypothyroidism 07/09/2015   Morbid obesity (HCC) 01/18/2013   Encounter for well adult exam with abnormal findings 05/02/2011   Left knee pain 05/02/2011   Seasonal and perennial allergic rhinitis 03/01/2010   SNORING 03/01/2010   Allergic-infective asthma 12/16/2008   INSOMNIA, CHRONIC 07/25/2007   PTSD 07/25/2007   Headache(784.0) 07/25/2007    PCP: Norleen Lynwood MOHR MD  REFERRING PROVIDER: Vernetta Lonni GRADE, MD  REFERRING DIAG: [phone] (ICD-10-CM) - Status post total left knee replacement  THERAPY DIAG:  Chronic pain of left knee  Stiffness of left knee, not elsewhere classified  Localized edema  Muscle weakness (generalized)  Difficulty in walking, not elsewhere classified  Rationale for Evaluation and Treatment: Rehabilitation  ONSET DATE: Lt TKA 05/03/2024  SUBJECTIVE:   SUBJECTIVE STATEMENT:  Pt indicated some soreness with sleeping. Can walk the dogs short distances only.   PERTINENT HISTORY: Arrhritis.    PAIN:  NPRS scale: no pain.  Pain location: Lt knee Pain description: tightness, ache.  Aggravating factors: WB pressure, end range flexion, static positioning.  Relieving factors: medicine, Ice   PRECAUTIONS: None  WEIGHT BEARING RESTRICTIONS: No  FALLS:  Has patient fallen in last 6 months? No  LIVING ENVIRONMENT: Lives in: House/apartment Stairs: Child psychotherapist on main floor, has stairs.  2 to enter from  garage.  Has following equipment at home: FWW, SPC, elevated toilet seat  OCCUPATION: Remote work from home, part time.   PLOF: Independent, walking dog for exercise.   PATIENT GOALS: Reduce pain, dog walking   OBJECTIVE:   PATIENT SURVEYS:  Patient-Specific Activity Scoring Scheme  0 represents "unable to perform." 10 represents "able to perform at prior level. 0 1 2 3 4 5 6 7 8 9  10 (Date and Score)   Activity Eval  05/22/2024 06/18/2024   1. Dog walking  0  5  2. Putting on pants  8  10  3. Steps   1   4.     5.    Score 3 avg    Total score = sum of the activity scores/number of activities Minimum detectable change (90%CI) for average score = 2 points Minimum detectable change (90%CI) for single activity score = 3 points  COGNITION: 05/22/2024 Overall cognitive status: WFL    SENSATION: 05/22/2024 WFl  EDEMA:  05/22/2024 Localized edema noted in Lt leg.  Lt knee joint 14.5 inch  Rt knee joint 13.2 inch  MUSCLE LENGTH: 05/22/2024 No specific testing  POSTURE:  05/22/2024 Weight shift to Rt in standing.   PALPATION: 05/22/2024 General tenderness around knee Lt.   LOWER EXTREMITY MMT:   ROM Right Eval 05/22/2024 Left Eval 05/22/2024 Right 06/18/2024 Left 06/18/2024  Hip flexion 5/5 4/5    Hip extension      Hip abduction      Hip adduction      Hip internal rotation      Hip external rotation      Knee flexion 5/5 3+/5 5/5 5/5  Knee extension 5/5 4/5 5/5 43.9, 43.5 5/5 31, 31 lbs  Ankle dorsiflexion      Ankle plantarflexion      Ankle inversion      Ankle eversion       (Blank rows = not tested)  LOWER EXTREMITY ROM:  ROM Right Eval 05/22/2024 Left Eval 05/22/2024 Left 05/27/2024 Left 06/11/2024 06/18/2024 Left  Hip flexion       Hip extension       Hip abduction       Hip adduction       Hip internal rotation       Hip external rotation       Knee flexion   Supine AROM heel slide 90 100 Active 105 AROM  Knee extension  AROM in seated LAQ -16  Passive heel prop  -9 AROM in setaed LAQ -5  -4 passive heel prop -3 Active 0 in AROM seated LAQ  Ankle dorsiflexion       Ankle plantarflexion       Ankle inversion       Ankle eversion        (Blank rows = not tested)  LOWER EXTREMITY SPECIAL TESTS:  05/22/2024 No specific testing.   FUNCTIONAL TESTS:  06/18/2024  05/22/2024 18 inch chair transfer: no UE but Rt leg dominant movement.  Lt SLS: unable  Rt SLS: not tested  GAIT: 06/18/2024: independent ambulation in clinic and reported on  own.   05/27/2024: SPC use in clinic with mod independence.  Able to demonstrate independent ambulation as well.   05/22/2024 FWW use in clinic household distances.  Reduced stance on Lt, lacking TKE in stance.  TODAY'S TREATMENT                                                                    DATE:  06/20/24 TherEx Nustep 8 min level 3 Incline gastroc stretch 30 sec x 3 bilateral   Neuro Re-ed Standing heel/toe alternating on foam x 15 each way with occasional HHA  Walking on air ex 6 laps   TherActivity Leg press double leg 100 lbs 10x3  slow lowering focus, single leg 50 lbs 2 x 10 bilaterally  Steps in // bars 6 inch  Vaso Lt knee in elevation 10 mins medium compression 34 degrees.     06/18/2024 TherEx UBE fwd/back ROM for knee lvl 3.5 10 mins (ended up more forward) Incline gastroc stretch 30 sec x 3 bilateral   Neuro Re-ed SLS with slider fwd, side, reverse with focus on WB on single leg with light touch on slider x 8 each way bilaterally - occasional HHA on bar Standing heel/toe alternating on foam x 15 each way with occasional HHA  Tep down off 6 inch step leading with the R   TherActivity Flight of stairs reciprocal gait pattern up/down with Rt hand rail going down, Lt going up for household simulation x 1 each way with SBA Leg press double leg 112 lbs x 15 slow lowering focus, single leg 56 lbs 2 x 15 bilaterally   Vaso Lt knee in elevation 10 mins medium compression 34 degrees.    TODAY'S TREATMENT                                                                    DATE: 06/13/2024 TherEx Recumbent bike seat 8, level 1, partial circles with occasional full circles, for 6 minutes  Double leg press 112#, 2x15. Focus on full extension with DF Single leg press, 62#, 2x15. Focus on full extension  with DF Star taps w/ UE regression on floor and foam Heel taps w/ Lt LE from 4 box, 3x8 LAQ w/ 2# on ankle w/ 3 second hold in extension, 2x10  Manual Distraction, IR, flexion w/ contract/relax technique  Vaso Lt LE elevated, 34 degrees, medium compression for 10 minutes    TODAY'S TREATMENT                                                                    DATE:  06/10/2024 Recumbent bike Seat 8 for 5 minutes, followed by 10 revolutions of CW/CCW Quad sets with heel prop 10 x 5 seconds Seated knee flexion AAROM 10 x 10 seconds Seated straight leg raises 3 sets of 5 for 3 seconds  Functional Activities: Double Leg Press 100# 15 x slow eccentrics, full extension and stretch into flexion Single Leg Press 50# 10 x slow eccentrics, full extension and  stretch into flexion  Vaso Left Knee Medium 10 minutes 34*   TODAY'S TREATMENT                                                                          DATE: 06/05/2024 [id number] Therex:   Nustep Level 2 7.5 minutes BL leg press 62#, 3x10.  SL leg press 31#, 2x10 Seated Lt leg LAQ w/ 2#, 1x10 w/ 5 second hold. Contralateral leg movement opposite  Heel slides  Neuro Re-ed: Step ups/down with L LE leading on 6 inch step Step downs off 6 inch step with R LE leading Cone taps: 8x w/ B UE, 8x w/ L UE, and 2x8 w/ no UE.  Manual:  PROM for flexion in supine   PATIENT EDUCATION:  06/18/2024 Education details: HEP update Person educated: Patient Education method: Programmer, multimedia, Demonstration, Verbal cues, and Handouts Education comprehension: verbalized understanding, returned demonstration, and verbal cues required  HOME EXERCISE PROGRAM: Access Code: [email or web address] URL: [email or web address] Date: 06/18/2024 Prepared by: Ozell Silvan  Exercises - Supine Heel Slide (Mirrored)  - 3-5 x daily - 7 x weekly - 1 sets - 10 reps - 5 hold - Supine Heel Slide with Strap  - 3-5 x daily - 7 x weekly - 1 sets - 10 reps - 5 hold -  Seated Long Arc Quad (Mirrored)  - 3-5 x daily - 7 x weekly - 1 sets - 5-10 reps - 2 hold - Supine Knee Extension Mobilization with Weight (Mirrored)  - 4-5 x daily - 7 x weekly - 1 sets - 1 reps - to tolerance up to 5 mins hold - Seated Quad Set (Mirrored)  - 3-5 x daily - 7 x weekly - 1 sets - 10 reps - 5 hold - Seated Knee Flexion Extension AAROM with Overpressure (Mirrored)  - 2-3 x daily - 7 x weekly - 1 sets - 5 reps - 10 hold - Seated SLR (Mirrored)  - 1-2 x daily - 7 x weekly - 1-2 sets - 10-15 reps - 2 hold  ASSESSMENT:  CLINICAL IMPRESSION:  Pt needed a decrease in resistance on leg press machine due to fatigue and soreness in the affected knee.  Demonstrated good form.    OBJECTIVE IMPAIRMENTS: Abnormal gait, decreased activity tolerance, decreased balance, decreased coordination, decreased endurance, decreased mobility, difficulty walking, decreased ROM, decreased strength, hypomobility, increased edema, increased fascial restrictions, impaired perceived functional ability, increased muscle spasms, impaired flexibility, improper body mechanics, and pain.   ACTIVITY LIMITATIONS: carrying, lifting, bending, sitting, standing, squatting, sleeping, stairs, transfers, bed mobility, bathing, toileting, dressing, hygiene/grooming, and locomotion level  PARTICIPATION LIMITATIONS: meal prep, cleaning, laundry, interpersonal relationship, driving, shopping, community activity, occupation, and yard work  PERSONAL FACTORS: no specific factors are also affecting patient's functional outcome.   REHAB POTENTIAL: Good  CLINICAL DECISION MAKING: Stable/uncomplicated  EVALUATION COMPLEXITY: Low   GOALS: Goals reviewed with patient? Yes  SHORT TERM GOALS: (target date for Short term goals are 3 weeks 06/12/2024)   1.  Patient will demonstrate independent use of home exercise program to maintain progress from in clinic treatments.  Goal status: Met 06/10/2024  LONG TERM GOALS: (target dates  for all long term goals are 10 weeks  07/31/2024 )  1. Patient will demonstrate/report pain at worst less than or equal to 2/10 to facilitate minimal limitation in daily activity secondary to pain symptoms.  Goal status: Ongoing 06/10/2024   2. Patient will demonstrate independent use of home exercise program to facilitate ability to maintain/progress functional gains from skilled physical therapy services.  Goal status: New   3. Patient will demonstrate Patient specific functional scale avg > or = 8/10 to indicate reduced disability due to condition.   Goal status: New   4.  Patient will demonstrate Lt  LE MMT 5/5 throughout to faciltiate usual transfers, stairs, squatting at [facility] for daily life.   Goal status: Met 06/18/2024   5.  Patient will demonstrate Lt knee AROM 0-110 deg to facilitate mobility for transfers, walking, stairs at PLOF.  Goal status: Ongoing 06/18/2024   6. Patient will demonstrate ascending/descending stairs reciprocally s UE assist for community integration.   Goal status: Ongoing 06/18/2024   7.  Patient will demonstrate independent ambulation > 500 ft for community ambulation , dog walking at PLOF.  Goal Status: Ongoing 06/18/2024   PLAN:  PT FREQUENCY: 1-2x/week  PT DURATION: 10 weeks  PLANNED INTERVENTIONS: Can include 02853- PT Re-evaluation, 97110-Therapeutic exercises, 97530- Therapeutic activity, 97112- Neuromuscular re-education, 97535- Self Care, 97140- Manual therapy, [phone]- Gait training, [phone]- Orthotic Fit/training, [phone]- Canalith repositioning, [id number]- Aquatic Therapy, [phone]- Electrical stimulation (unattended), [id number] Physical performance testing, 97016- Vasopneumatic device, [id number]- Ultrasound, [id number]- Traction (mechanical), [id number]- Ionotophoresis 4mg /ml Dexamethasone ,  [id number] - Needle insertion w/o injection 1 or 2 muscles, 20561 - Needle insertion w/o injection 3 or more muscles.   Patient/Family education, Balance training, Stair training, Taping,  Dry Needling, Joint mobilization, Joint manipulation, Spinal manipulation, Spinal mobilization, Scar mobilization, Vestibular training, Visual/preceptual remediation/compensation, DME instructions, Cryotherapy, and Moist heat.  All performed as medically necessary.  All included unless contraindicated  PLAN FOR NEXT SESSION: Continue dynamic stability improvements, strength gains.   Burnard Meth, PT 06/20/24  5:05 PM

## 2024-06-24 ENCOUNTER — Encounter: Admitting: Physical Therapy

## 2024-06-26 ENCOUNTER — Ambulatory Visit: Admitting: Physical Therapy

## 2024-06-26 ENCOUNTER — Encounter: Payer: Self-pay | Admitting: Physical Therapy

## 2024-06-26 ENCOUNTER — Telehealth: Payer: Self-pay

## 2024-06-26 DIAGNOSIS — R600 Localized edema: Secondary | ICD-10-CM

## 2024-06-26 DIAGNOSIS — G8929 Other chronic pain: Secondary | ICD-10-CM

## 2024-06-26 DIAGNOSIS — R262 Difficulty in walking, not elsewhere classified: Secondary | ICD-10-CM

## 2024-06-26 DIAGNOSIS — M25562 Pain in left knee: Secondary | ICD-10-CM | POA: Diagnosis not present

## 2024-06-26 DIAGNOSIS — M6281 Muscle weakness (generalized): Secondary | ICD-10-CM

## 2024-06-26 DIAGNOSIS — M25662 Stiffness of left knee, not elsewhere classified: Secondary | ICD-10-CM | POA: Diagnosis not present

## 2024-06-26 NOTE — Telephone Encounter (Signed)
 Copied from CRM [id number]. Topic: Clinical - Request for Lab/Test Order >> Jun 26, 2024  3:18 PM Laurie Solomon wrote: Reason for CRM: Patient is requesting her lab orders for her yearly physical be placed so she can schedule lab appt and get labs before she sees the doctor. Pt requesting call back once lab order is placed so she knows to schedule labs.

## 2024-06-26 NOTE — Therapy (Signed)
 OUTPATIENT PHYSICAL THERAPY  TREATMENT NOTE   Patient Name: Laurie Solomon MRN: [id number] DOB:Dec 11, 1970, 53 y.o., female Today's Date: 06/26/2024      END OF SESSION:  PT End of Session - 06/26/24 1311     Visit Number 9    Number of Visits 20    Date for PT Re-Evaluation 07/31/24    Authorization Type [employer] $52 copay    Progress Note Due on Visit 17    PT Start Time 1309   pt arrived late   PT Stop Time 1341    PT Time Calculation (min) 32 min    Activity Tolerance Patient tolerated treatment well;No increased pain    Behavior During Therapy [facility] for tasks assessed/performed                  Past Medical History:  Diagnosis Date   Agatston coronary artery calcium score less than 100    Allergic rhinitis    Allergy     Arthritis    Asthma    childhood   Chronic insomnia    Headache(784.0)    Hx of adenomatous polyp of colon 05/13/2021   diminutive x 1   Hypothyroidism 07/09/2015   Post traumatic stress disorder    Vitamin D  deficiency 07/09/2015   Past Surgical History:  Procedure Laterality Date   COLONOSCOPY W/ POLYPECTOMY  05/13/2021   REFRACTIVE SURGERY Bilateral 2010   Lasik   TOTAL KNEE ARTHROPLASTY Left 05/03/2024   Procedure: ARTHROPLASTY, KNEE, TOTAL;  Surgeon: Vernetta Lonni GRADE, MD;  Location: [facility];  Service: Orthopedics;  Laterality: Left;   WISDOM TOOTH EXTRACTION     Patient Active Problem List   Diagnosis Date Noted   Status post total left knee replacement 05/03/2024   Unilateral primary osteoarthritis, left knee 06/07/2023   HLD (hyperlipidemia) 06/05/2021   Hyperglycemia 06/01/2021   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   Depression 10/10/2020   Insect bite 10/10/2020   Right ear pain 10/08/2020   ETD (Eustachian tube dysfunction), bilateral 10/05/2020   Non-recurrent acute suppurative otitis media of right ear with spontaneous rupture of tympanic membrane 10/05/2020   Rhinitis,  chronic 10/05/2020   Chronic cholecystitis 04/29/2020   Epigastric pain 12/03/2018   Snoring 10/13/2016   Asthma with exacerbation 10/13/2016   Vitamin D  deficiency 07/09/2015   Hypothyroidism 07/09/2015   Morbid obesity (HCC) 01/18/2013   Encounter for well adult exam with abnormal findings 05/02/2011   Left knee pain 05/02/2011   Seasonal and perennial allergic rhinitis 03/01/2010   SNORING 03/01/2010   Allergic-infective asthma 12/16/2008   INSOMNIA, CHRONIC 07/25/2007   PTSD 07/25/2007   Headache(784.0) 07/25/2007    PCP: Norleen Lynwood MOHR MD  REFERRING PROVIDER: Vernetta Lonni GRADE, MD  REFERRING DIAG: [phone] (ICD-10-CM) - Status post total left knee replacement  THERAPY DIAG:  Chronic pain of left knee  Stiffness of left knee, not elsewhere classified  Localized edema  Muscle weakness (generalized)  Difficulty in walking, not elsewhere classified  Rationale for Evaluation and Treatment: Rehabilitation  ONSET DATE: Lt TKA 05/03/2024  SUBJECTIVE:   SUBJECTIVE STATEMENT:  Had some medial knee pain after doing standing knee flexion. Didn't feel like the injection was overly helpful at this time.   PERTINENT HISTORY: Arrhritis.    PAIN:  NPRS scale: no pain.  Pain location: Lt knee Pain description: tightness, ache.  Aggravating factors: WB pressure, end range flexion, static positioning.  Relieving factors: medicine, Ice   PRECAUTIONS: None  WEIGHT BEARING RESTRICTIONS: No  FALLS:  Has patient fallen in last 6 months? No  LIVING ENVIRONMENT: Lives in: House/apartment Stairs: Child psychotherapist on main floor, has stairs.  2 to enter from garage.  Has following equipment at home: FWW, SPC, elevated toilet seat  OCCUPATION: Remote work from home, part time.   PLOF: Independent, walking dog for exercise.   PATIENT GOALS: Reduce pain, dog walking   OBJECTIVE:   PATIENT SURVEYS:  Patient-Specific Activity Scoring Scheme  0 represents "unable to  perform." 10 represents "able to perform at prior level. 0 1 2 3 4 5 6 7 8 9  10 (Date and Score)   Activity Eval  05/22/2024 06/18/2024   1. Dog walking  0  5  2. Putting on pants  8  10  3. Steps  1   4.     5.    Score 3 avg    Total score = sum of the activity scores/number of activities Minimum detectable change (90%CI) for average score = 2 points Minimum detectable change (90%CI) for single activity score = 3 points  COGNITION: 05/22/2024 Overall cognitive status: WFL    SENSATION: 05/22/2024 WFl  EDEMA:  05/22/2024 Localized edema noted in Lt leg.  Lt knee joint 14.5 inch  Rt knee joint 13.2 inch  MUSCLE LENGTH: 05/22/2024 No specific testing  POSTURE:  05/22/2024 Weight shift to Rt in standing.   PALPATION: 05/22/2024 General tenderness around knee Lt.   LOWER EXTREMITY MMT:   ROM Right Eval 05/22/2024 Left Eval 05/22/2024 Right 06/18/2024 Left 06/18/2024  Hip flexion 5/5 4/5    Hip extension      Hip abduction      Hip adduction      Hip internal rotation      Hip external rotation      Knee flexion 5/5 3+/5 5/5 5/5  Knee extension 5/5 4/5 5/5 43.9, 43.5 5/5 31, 31 lbs  Ankle dorsiflexion      Ankle plantarflexion      Ankle inversion      Ankle eversion       (Blank rows = not tested)  LOWER EXTREMITY ROM:  ROM Right Eval 05/22/2024 Left Eval 05/22/2024 Left 05/27/2024 Left 06/11/2024 06/18/2024 Left  Hip flexion       Hip extension       Hip abduction       Hip adduction       Hip internal rotation       Hip external rotation       Knee flexion   Supine AROM heel slide 90 100 Active 105 AROM  Knee extension  AROM in seated LAQ -16  Passive heel prop  -9 AROM in setaed LAQ -5  -4 passive heel prop -3 Active 0 in AROM seated LAQ  Ankle dorsiflexion       Ankle plantarflexion       Ankle inversion       Ankle eversion        (Blank rows = not tested)  LOWER EXTREMITY SPECIAL TESTS:  05/22/2024 No specific testing.    FUNCTIONAL TESTS:  06/18/2024  05/22/2024 18 inch chair transfer: no UE but Rt leg dominant movement.  Lt SLS: unable  Rt SLS: not tested  GAIT: 06/18/2024: independent ambulation in clinic and reported on own.   05/27/2024: SPC use in clinic with mod independence.  Able to demonstrate independent ambulation as well.   05/22/2024 FWW use in clinic household distances.  Reduced stance on Lt,  lacking TKE in stance.                                                                                                                                                                         TODAY'S TREATMENT 06/26/24 TherEx UBE LEs only L3 x 8 min Educated on LLLD flexion stretch at home to help maximize flexion   TherAct LLE on 6 step with Rt heel taps 2x10 Forward step ups onto 6 step 2x10 bil Leg press 100# 3x10 with flexion holds x 30 sec between sets; single limb 50# 3x10 bil Sit to/from stand 2x10; 12# KB    06/20/24 TherEx Nustep 8 min level 3 Incline gastroc stretch 30 sec x 3 bilateral   Neuro Re-ed Standing heel/toe alternating on foam x 15 each way with occasional HHA  Walking on air ex 6 laps   TherActivity Leg press double leg 100 lbs 10x3  slow lowering focus, single leg 50 lbs 2 x 10 bilaterally  Steps in // bars 6 inch  Vaso Lt knee in elevation 10 mins medium compression 34 degrees.     06/18/2024 TherEx UBE fwd/back ROM for knee lvl 3.5 10 mins (ended up more forward) Incline gastroc stretch 30 sec x 3 bilateral   Neuro Re-ed SLS with slider fwd, side, reverse with focus on WB on single leg with light touch on slider x 8 each way bilaterally - occasional HHA on bar Standing heel/toe alternating on foam x 15 each way with occasional HHA  Tep down off 6 inch step leading with the R   TherActivity Flight of stairs reciprocal gait pattern up/down with Rt hand rail going down, Lt going up for household simulation x 1 each way with SBA Leg press double  leg 112 lbs x 15 slow lowering focus, single leg 56 lbs 2 x 15 bilaterally   Vaso Lt knee in elevation 10 mins medium compression 34 degrees.   06/13/2024 TherEx Recumbent bike seat 8, level 1, partial circles with occasional full circles, for 6 minutes  Double leg press 112#, 2x15. Focus on full extension with DF Single leg press, 62#, 2x15. Focus on full extension with DF Star taps w/ UE regression on floor and foam Heel taps w/ Lt LE from 4 box, 3x8 LAQ w/ 2# on ankle w/ 3 second hold in extension, 2x10  Manual Distraction, IR, flexion w/ contract/relax technique  Vaso Lt LE elevated, 34 degrees, medium compression for 10 minutes    06/10/2024 Recumbent bike Seat 8 for 5 minutes, followed by 10 revolutions of CW/CCW Quad sets with heel prop 10 x 5 seconds Seated knee flexion AAROM 10 x 10 seconds Seated straight leg raises 3 sets of 5 for 3 seconds  Functional Activities: Double Leg Press 100# 15 x slow eccentrics, full extension and stretch into flexion Single Leg Press 50# 10 x slow eccentrics, full extension and stretch into flexion  Vaso Left Knee Medium 10 minutes 34*   06/05/2024 [id number] Therex:   Nustep Level 2 7.5 minutes BL leg press 62#, 3x10.  SL leg press 31#, 2x10 Seated Lt leg LAQ w/ 2#, 1x10 w/ 5 second hold. Contralateral leg movement opposite  Heel slides  Neuro Re-ed: Step ups/down with L LE leading on 6 inch step Step downs off 6 inch step with R LE leading Cone taps: 8x w/ B UE, 8x w/ L UE, and 2x8 w/ no UE.  Manual:  PROM for flexion in supine   PATIENT EDUCATION:  06/18/2024 Education details: HEP update Person educated: Patient Education method: Programmer, multimedia, Demonstration, Verbal cues, and Handouts Education comprehension: verbalized understanding, returned demonstration, and verbal cues required  HOME EXERCISE PROGRAM: Access Code: [email or web address] URL: [email or web address] Date: 06/18/2024 Prepared by: Ozell Silvan  Exercises - Supine Heel Slide (Mirrored)  - 3-5 x daily - 7 x weekly - 1 sets - 10 reps - 5 hold - Supine Heel Slide with Strap  - 3-5 x daily - 7 x weekly - 1 sets - 10 reps - 5 hold - Seated Long Arc Quad (Mirrored)  - 3-5 x daily - 7 x weekly - 1 sets - 5-10 reps - 2 hold - Supine Knee Extension Mobilization with Weight (Mirrored)  - 4-5 x daily - 7 x weekly - 1 sets - 1 reps - to tolerance up to 5 mins hold - Seated Quad Set (Mirrored)  - 3-5 x daily - 7 x weekly - 1 sets - 10 reps - 5 hold - Seated Knee Flexion Extension AAROM with Overpressure (Mirrored)  - 2-3 x daily - 7 x weekly - 1 sets - 5 reps - 10 hold - Seated SLR (Mirrored)  - 1-2 x daily - 7 x weekly - 1-2 sets - 10-15 reps - 2 hold  ASSESSMENT:  CLINICAL IMPRESSION:  Pt tolerated session well today focusing on functional strength and flexion ROM.  Overall progressing well with PT and anticipate d/c next 1-2 weeks.   OBJECTIVE IMPAIRMENTS: Abnormal gait, decreased activity tolerance, decreased balance, decreased coordination, decreased endurance, decreased mobility, difficulty walking, decreased ROM, decreased strength, hypomobility, increased edema, increased fascial restrictions, impaired perceived functional ability, increased muscle spasms, impaired flexibility, improper body mechanics, and pain.   ACTIVITY LIMITATIONS: carrying, lifting, bending, sitting, standing, squatting, sleeping, stairs, transfers, bed mobility, bathing, toileting, dressing, hygiene/grooming, and locomotion level  PARTICIPATION LIMITATIONS: meal prep, cleaning, laundry, interpersonal relationship, driving, shopping, community activity, occupation, and yard work  PERSONAL FACTORS: no specific factors are also affecting patient's functional outcome.   REHAB POTENTIAL: Good  CLINICAL DECISION MAKING: Stable/uncomplicated  EVALUATION COMPLEXITY: Low   GOALS: Goals reviewed with patient? Yes  SHORT TERM GOALS: (target date for Short  term goals are 3 weeks 06/12/2024)   1.  Patient will demonstrate independent use of home exercise program to maintain progress from in clinic treatments.  Goal status: Met 06/10/2024  LONG TERM GOALS: (target dates for all long term goals are 10 weeks  07/31/2024 )   1. Patient will demonstrate/report pain at worst less than or equal to 2/10 to facilitate minimal limitation in daily activity secondary to pain symptoms.  Goal status: Ongoing 06/10/2024   2. Patient will demonstrate independent use of home exercise program to facilitate ability to maintain/progress functional  gains from skilled physical therapy services.  Goal status: New   3. Patient will demonstrate Patient specific functional scale avg > or = 8/10 to indicate reduced disability due to condition.   Goal status: New   4.  Patient will demonstrate Lt  LE MMT 5/5 throughout to faciltiate usual transfers, stairs, squatting at [facility] for daily life.   Goal status: Met 06/18/2024   5.  Patient will demonstrate Lt knee AROM 0-110 deg to facilitate mobility for transfers, walking, stairs at PLOF.  Goal status: Ongoing 06/18/2024   6. Patient will demonstrate ascending/descending stairs reciprocally s UE assist for community integration.   Goal status: Ongoing 06/18/2024   7.  Patient will demonstrate independent ambulation > 500 ft for community ambulation , dog walking at PLOF.  Goal Status: Ongoing 06/18/2024   PLAN:  PT FREQUENCY: 1-2x/week  PT DURATION: 10 weeks  PLANNED INTERVENTIONS: Can include 02853- PT Re-evaluation, 97110-Therapeutic exercises, 97530- Therapeutic activity, 97112- Neuromuscular re-education, 97535- Self Care, 97140- Manual therapy, [phone]- Gait training, [phone]- Orthotic Fit/training, [phone]- Canalith repositioning, [id number]- Aquatic Therapy, [phone]- Electrical stimulation (unattended), [id number] Physical performance testing, 97016- Vasopneumatic device, [id number]- Ultrasound, [id number]- Traction (mechanical), [id number]-  Ionotophoresis 4mg /ml Dexamethasone ,  [id number] - Needle insertion w/o injection 1 or 2 muscles, 20561 - Needle insertion w/o injection 3 or more muscles.   Patient/Family education, Balance training, Stair training, Taping, Dry Needling, Joint mobilization, Joint manipulation, Spinal manipulation, Spinal mobilization, Scar mobilization, Vestibular training, Visual/preceptual remediation/compensation, DME instructions, Cryotherapy, and Moist heat.  All performed as medically necessary.  All included unless contraindicated  PLAN FOR NEXT SESSION: begin d/c planning (has weights at home) Continue dynamic stability improvements, strength gains.     Corean JULIANNA Ku, PT, DPT 06/26/24 1:45 PM

## 2024-06-27 ENCOUNTER — Other Ambulatory Visit: Payer: Self-pay | Admitting: Internal Medicine

## 2024-06-27 ENCOUNTER — Encounter: Admitting: Internal Medicine

## 2024-06-27 DIAGNOSIS — E538 Deficiency of other specified B group vitamins: Secondary | ICD-10-CM

## 2024-06-27 DIAGNOSIS — E785 Hyperlipidemia, unspecified: Secondary | ICD-10-CM

## 2024-06-27 DIAGNOSIS — E559 Vitamin D deficiency, unspecified: Secondary | ICD-10-CM

## 2024-06-27 DIAGNOSIS — R739 Hyperglycemia, unspecified: Secondary | ICD-10-CM

## 2024-06-27 DIAGNOSIS — E7800 Pure hypercholesterolemia, unspecified: Secondary | ICD-10-CM

## 2024-06-27 NOTE — Telephone Encounter (Signed)
 Ok labs are ordered

## 2024-07-01 ENCOUNTER — Ambulatory Visit (INDEPENDENT_AMBULATORY_CARE_PROVIDER_SITE_OTHER): Admitting: Physical Therapy

## 2024-07-01 ENCOUNTER — Other Ambulatory Visit (INDEPENDENT_AMBULATORY_CARE_PROVIDER_SITE_OTHER)

## 2024-07-01 ENCOUNTER — Encounter: Admitting: Physician Assistant

## 2024-07-01 ENCOUNTER — Encounter: Payer: Self-pay | Admitting: Physical Therapy

## 2024-07-01 DIAGNOSIS — M25662 Stiffness of left knee, not elsewhere classified: Secondary | ICD-10-CM

## 2024-07-01 DIAGNOSIS — R600 Localized edema: Secondary | ICD-10-CM

## 2024-07-01 DIAGNOSIS — R739 Hyperglycemia, unspecified: Secondary | ICD-10-CM | POA: Diagnosis not present

## 2024-07-01 DIAGNOSIS — E7800 Pure hypercholesterolemia, unspecified: Secondary | ICD-10-CM | POA: Diagnosis not present

## 2024-07-01 DIAGNOSIS — E559 Vitamin D deficiency, unspecified: Secondary | ICD-10-CM

## 2024-07-01 DIAGNOSIS — G8929 Other chronic pain: Secondary | ICD-10-CM

## 2024-07-01 DIAGNOSIS — E538 Deficiency of other specified B group vitamins: Secondary | ICD-10-CM

## 2024-07-01 DIAGNOSIS — R262 Difficulty in walking, not elsewhere classified: Secondary | ICD-10-CM

## 2024-07-01 DIAGNOSIS — M6281 Muscle weakness (generalized): Secondary | ICD-10-CM | POA: Diagnosis not present

## 2024-07-01 DIAGNOSIS — M25562 Pain in left knee: Secondary | ICD-10-CM

## 2024-07-01 LAB — HEPATIC FUNCTION PANEL
ALT: 12 U/L (ref 0–35)
AST: 13 U/L (ref 0–37)
Albumin: 4.3 g/dL (ref 3.5–5.2)
Alkaline Phosphatase: 88 U/L (ref 39–117)
Bilirubin, Direct: 0.1 mg/dL (ref 0.0–0.3)
Total Bilirubin: 0.4 mg/dL (ref 0.2–1.2)
Total Protein: 6.9 g/dL (ref 6.0–8.3)

## 2024-07-01 LAB — CBC WITH DIFFERENTIAL/PLATELET
Basophils Absolute: 0.1 K/uL (ref 0.0–0.1)
Basophils Relative: 0.8 % (ref 0.0–3.0)
Eosinophils Absolute: 0.1 K/uL (ref 0.0–0.7)
Eosinophils Relative: 1.2 % (ref 0.0–5.0)
HCT: 42.2 % (ref 36.0–46.0)
Hemoglobin: 14.3 g/dL (ref 12.0–15.0)
Lymphocytes Relative: 36.3 % (ref 12.0–46.0)
Lymphs Abs: 2.2 K/uL (ref 0.7–4.0)
MCHC: 33.8 g/dL (ref 30.0–36.0)
MCV: 87.8 fl (ref 78.0–100.0)
Monocytes Absolute: 0.4 K/uL (ref 0.1–1.0)
Monocytes Relative: 6.8 % (ref 3.0–12.0)
Neutro Abs: 3.4 K/uL (ref 1.4–7.7)
Neutrophils Relative %: 54.9 % (ref 43.0–77.0)
Platelets: 259 K/uL (ref 150.0–400.0)
RBC: 4.81 Mil/uL (ref 3.87–5.11)
RDW: 12.8 % (ref 11.5–15.5)
WBC: 6.2 K/uL (ref 4.0–10.5)

## 2024-07-01 LAB — URINALYSIS, ROUTINE W REFLEX MICROSCOPIC
Bilirubin Urine: NEGATIVE
Hgb urine dipstick: NEGATIVE
Ketones, ur: NEGATIVE
Leukocytes,Ua: NEGATIVE
Nitrite: NEGATIVE
RBC / HPF: NONE SEEN (ref 0–?)
Specific Gravity, Urine: 1.02 (ref 1.000–1.030)
Total Protein, Urine: NEGATIVE
Urine Glucose: NEGATIVE
Urobilinogen, UA: 0.2 (ref 0.0–1.0)
pH: 6 (ref 5.0–8.0)

## 2024-07-01 LAB — LIPID PANEL
Cholesterol: 188 mg/dL (ref 0–200)
HDL: 51.7 mg/dL (ref >39.00)
LDL Cholesterol: 118 mg/dL — ABNORMAL HIGH (ref 0–99)
NonHDL: 136.26
Total CHOL/HDL Ratio: 4
Triglycerides: 89 mg/dL (ref 0.0–149.0)
VLDL: 17.8 mg/dL (ref 0.0–40.0)

## 2024-07-01 LAB — BASIC METABOLIC PANEL WITH GFR
BUN: 12 mg/dL (ref 6–23)
CO2: 28 meq/L (ref 19–32)
Calcium: 8.9 mg/dL (ref 8.4–10.5)
Chloride: 102 meq/L (ref 96–112)
Creatinine, Ser: 0.79 mg/dL (ref 0.40–1.20)
GFR: 85.36 mL/min (ref >60.00)
Glucose, Bld: 83 mg/dL (ref 70–99)
Potassium: 4.1 meq/L (ref 3.5–5.1)
Sodium: 141 meq/L (ref 135–145)

## 2024-07-01 LAB — HEMOGLOBIN A1C: Hgb A1c MFr Bld: 5.2 % (ref 4.6–6.5)

## 2024-07-01 LAB — TSH: TSH: 1.44 u[IU]/mL (ref 0.35–5.50)

## 2024-07-01 LAB — VITAMIN B12: Vitamin B-12: 338 pg/mL (ref 211–911)

## 2024-07-01 LAB — VITAMIN D 25 HYDROXY (VIT D DEFICIENCY, FRACTURES): VITD: 33.38 ng/mL (ref 30.00–100.00)

## 2024-07-01 NOTE — Therapy (Signed)
 OUTPATIENT PHYSICAL THERAPY  TREATMENT NOTE   Patient Name: Laurie Solomon MRN: [id number] DOB:11/07/1971, 53 y.o., female Today's Date: 07/01/2024      END OF SESSION:  PT End of Session - 07/01/24 1307     Visit Number 10    Number of Visits 20    Date for PT Re-Evaluation 07/31/24    Authorization Type [employer] $52 copay    Progress Note Due on Visit 17    PT Start Time 1305    PT Stop Time 1345    PT Time Calculation (min) 40 min    Activity Tolerance Patient tolerated treatment well;No increased pain    Behavior During Therapy [facility] for tasks assessed/performed                   Past Medical History:  Diagnosis Date   Agatston coronary artery calcium score less than 100    Allergic rhinitis    Allergy     Arthritis    Asthma    childhood   Chronic insomnia    Headache(784.0)    Hx of adenomatous polyp of colon 05/13/2021   diminutive x 1   Hypothyroidism 07/09/2015   Post traumatic stress disorder    Vitamin D  deficiency 07/09/2015   Past Surgical History:  Procedure Laterality Date   COLONOSCOPY W/ POLYPECTOMY  05/13/2021   REFRACTIVE SURGERY Bilateral 2010   Lasik   TOTAL KNEE ARTHROPLASTY Left 05/03/2024   Procedure: ARTHROPLASTY, KNEE, TOTAL;  Surgeon: Vernetta Lonni GRADE, MD;  Location: [facility];  Service: Orthopedics;  Laterality: Left;   WISDOM TOOTH EXTRACTION     Patient Active Problem List   Diagnosis Date Noted   Status post total left knee replacement 05/03/2024   Unilateral primary osteoarthritis, left knee 06/07/2023   HLD (hyperlipidemia) 06/05/2021   Hyperglycemia 06/01/2021   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   Depression 10/10/2020   Insect bite 10/10/2020   Right ear pain 10/08/2020   ETD (Eustachian tube dysfunction), bilateral 10/05/2020   Non-recurrent acute suppurative otitis media of right ear with spontaneous rupture of tympanic membrane 10/05/2020   Rhinitis, chronic  10/05/2020   Chronic cholecystitis 04/29/2020   Epigastric pain 12/03/2018   Snoring 10/13/2016   Asthma with exacerbation 10/13/2016   Vitamin D  deficiency 07/09/2015   Hypothyroidism 07/09/2015   Morbid obesity (HCC) 01/18/2013   Encounter for well adult exam with abnormal findings 05/02/2011   Left knee pain 05/02/2011   Seasonal and perennial allergic rhinitis 03/01/2010   SNORING 03/01/2010   Allergic-infective asthma 12/16/2008   INSOMNIA, CHRONIC 07/25/2007   PTSD 07/25/2007   Headache(784.0) 07/25/2007    PCP: Norleen Lynwood MOHR MD  REFERRING PROVIDER: Vernetta Lonni GRADE, MD  REFERRING DIAG: [phone] (ICD-10-CM) - Status post total left knee replacement  THERAPY DIAG:  Chronic pain of left knee  Stiffness of left knee, not elsewhere classified  Localized edema  Muscle weakness (generalized)  Difficulty in walking, not elsewhere classified  Rationale for Evaluation and Treatment: Rehabilitation  ONSET DATE: Lt TKA 05/03/2024  SUBJECTIVE:   SUBJECTIVE STATEMENT:  Knee is stiff but not a lot of pain.     PERTINENT HISTORY: Arrhritis.    PAIN:  NPRS scale: no pain.  Pain location: Lt knee Pain description: tightness, ache.  Aggravating factors: WB pressure, end range flexion, static positioning.  Relieving factors: medicine, Ice   PRECAUTIONS: None  WEIGHT BEARING RESTRICTIONS: No  FALLS:  Has patient fallen in last  6 months? No  LIVING ENVIRONMENT: Lives in: House/apartment Stairs: Child psychotherapist on main floor, has stairs.  2 to enter from garage.  Has following equipment at home: FWW, SPC, elevated toilet seat  OCCUPATION: Remote work from home, part time.   PLOF: Independent, walking dog for exercise.   PATIENT GOALS: Reduce pain, dog walking   OBJECTIVE:   PATIENT SURVEYS:  Patient-Specific Activity Scoring Scheme  0 represents "unable to perform." 10 represents "able to perform at prior level. 0 1 2 3 4 5 6 7 8 9  10 (Date and  Score)   Activity Eval  05/22/2024 06/18/2024  07/01/24  1. Dog walking  0  5 5  2. Putting on pants  8  10 10   3. Steps  1  6  4.      5.     Score 3 avg  7   Total score = sum of the activity scores/number of activities Minimum detectable change (90%CI) for average score = 2 points Minimum detectable change (90%CI) for single activity score = 3 points  COGNITION: 05/22/2024 Overall cognitive status: WFL    SENSATION: 05/22/2024 WFl  EDEMA:  05/22/2024 Localized edema noted in Lt leg.  Lt knee joint 14.5 inch  Rt knee joint 13.2 inch  MUSCLE LENGTH: 05/22/2024 No specific testing  POSTURE:  05/22/2024 Weight shift to Rt in standing.   PALPATION: 05/22/2024 General tenderness around knee Lt.   LOWER EXTREMITY MMT:   ROM Right Eval 05/22/2024 Left Eval 05/22/2024 Right 06/18/2024 Left 06/18/2024  Hip flexion 5/5 4/5    Hip extension      Hip abduction      Hip adduction      Hip internal rotation      Hip external rotation      Knee flexion 5/5 3+/5 5/5 5/5  Knee extension 5/5 4/5 5/5 43.9, 43.5 5/5 31, 31 lbs  Ankle dorsiflexion      Ankle plantarflexion      Ankle inversion      Ankle eversion       (Blank rows = not tested)  LOWER EXTREMITY ROM:  ROM Right Eval 05/22/2024 Left Eval 05/22/2024 Left 05/27/2024 Left 06/11/2024 06/18/2024 Left 07/01/24 Left  Hip flexion        Hip extension        Hip abduction        Hip adduction        Hip internal rotation        Hip external rotation        Knee flexion   Supine AROM heel slide 90 100 Active 105 AROM A: 107 AA:: 110  Knee extension  AROM in seated LAQ -16  Passive heel prop  -9 AROM in setaed LAQ -5  -4 passive heel prop -3 Active 0 in AROM seated LAQ   Ankle dorsiflexion        Ankle plantarflexion        Ankle inversion        Ankle eversion         (Blank rows = not tested)  LOWER EXTREMITY SPECIAL TESTS:  05/22/2024 No specific testing.   FUNCTIONAL TESTS:   06/18/2024  05/22/2024 18 inch chair transfer: no UE but Rt leg dominant movement.  Lt SLS: unable  Rt SLS: not tested  GAIT: 06/18/2024: independent ambulation in clinic and reported on own.   05/27/2024: SPC use in clinic with mod independence.  Able to demonstrate independent ambulation as well.  05/22/2024 FWW use in clinic household distances.  Reduced stance on Lt, lacking TKE in stance.                                                                                                                                                                         TODAY'S TREATMENT 07/01/24 TherEx Recumbent bike no resistance to L3 x 8 min; seat 7 Knee extension machine 10# bil concentric, LLE eccentric 3x10 Hamstring curls 20# 3x10 AA heel slides 2x10 with ball ROM measurements - see above for details  TherAct TRX squats 2x10 TRX reverse lunges 2x10 reps bil alternating LLE on 6 step with Rt heel taps 2x10 LLE step up and over 2x10    06/26/24 TherEx UBE LEs only L3 x 8 min Educated on LLLD flexion stretch at home to help maximize flexion   TherAct LLE on 6 step with Rt heel taps 2x10 Forward step ups onto 6 step 2x10 bil Leg press 100# 3x10 with flexion holds x 30 sec between sets; single limb 50# 3x10 bil Sit to/from stand 2x10; 12# KB    06/20/24 TherEx Nustep 8 min level 3 Incline gastroc stretch 30 sec x 3 bilateral   Neuro Re-ed Standing heel/toe alternating on foam x 15 each way with occasional HHA  Walking on air ex 6 laps   TherActivity Leg press double leg 100 lbs 10x3  slow lowering focus, single leg 50 lbs 2 x 10 bilaterally  Steps in // bars 6 inch  Vaso Lt knee in elevation 10 mins medium compression 34 degrees.     06/18/2024 TherEx UBE fwd/back ROM for knee lvl 3.5 10 mins (ended up more forward) Incline gastroc stretch 30 sec x 3 bilateral   Neuro Re-ed SLS with slider fwd, side, reverse with focus on WB on single leg with light touch  on slider x 8 each way bilaterally - occasional HHA on bar Standing heel/toe alternating on foam x 15 each way with occasional HHA  Tep down off 6 inch step leading with the R   TherActivity Flight of stairs reciprocal gait pattern up/down with Rt hand rail going down, Lt going up for household simulation x 1 each way with SBA Leg press double leg 112 lbs x 15 slow lowering focus, single leg 56 lbs 2 x 15 bilaterally   Vaso Lt knee in elevation 10 mins medium compression 34 degrees.    PATIENT EDUCATION:  06/18/2024 Education details: HEP update Person educated: Patient Education method: Programmer, multimedia, Demonstration, Verbal cues, and Handouts Education comprehension: verbalized understanding, returned demonstration, and verbal cues required  HOME EXERCISE PROGRAM: Access Code: [email or web address] URL: [email or web address] Date: 06/18/2024 Prepared by: Ozell Silvan  Exercises - Supine Heel Slide (Mirrored)  - 3-5  x daily - 7 x weekly - 1 sets - 10 reps - 5 hold - Supine Heel Slide with Strap  - 3-5 x daily - 7 x weekly - 1 sets - 10 reps - 5 hold - Seated Long Arc Quad (Mirrored)  - 3-5 x daily - 7 x weekly - 1 sets - 5-10 reps - 2 hold - Supine Knee Extension Mobilization with Weight (Mirrored)  - 4-5 x daily - 7 x weekly - 1 sets - 1 reps - to tolerance up to 5 mins hold - Seated Quad Set (Mirrored)  - 3-5 x daily - 7 x weekly - 1 sets - 10 reps - 5 hold - Seated Knee Flexion Extension AAROM with Overpressure (Mirrored)  - 2-3 x daily - 7 x weekly - 1 sets - 5 reps - 10 hold - Seated SLR (Mirrored)  - 1-2 x daily - 7 x weekly - 1-2 sets - 10-15 reps - 2 hold  ASSESSMENT:  CLINICAL IMPRESSION:  Pt continues to do well with PT with excellent ROM and functional progress that's been made.  Anticipate she will be ready to hold PT after next visit.    OBJECTIVE IMPAIRMENTS: Abnormal gait, decreased activity tolerance, decreased balance, decreased coordination, decreased  endurance, decreased mobility, difficulty walking, decreased ROM, decreased strength, hypomobility, increased edema, increased fascial restrictions, impaired perceived functional ability, increased muscle spasms, impaired flexibility, improper body mechanics, and pain.   ACTIVITY LIMITATIONS: carrying, lifting, bending, sitting, standing, squatting, sleeping, stairs, transfers, bed mobility, bathing, toileting, dressing, hygiene/grooming, and locomotion level  PARTICIPATION LIMITATIONS: meal prep, cleaning, laundry, interpersonal relationship, driving, shopping, community activity, occupation, and yard work  PERSONAL FACTORS: no specific factors are also affecting patient's functional outcome.   REHAB POTENTIAL: Good  CLINICAL DECISION MAKING: Stable/uncomplicated  EVALUATION COMPLEXITY: Low   GOALS: Goals reviewed with patient? Yes  SHORT TERM GOALS: (target date for Short term goals are 3 weeks 06/12/2024)   1.  Patient will demonstrate independent use of home exercise program to maintain progress from in clinic treatments.  Goal status: Met 06/10/2024  LONG TERM GOALS: (target dates for all long term goals are 10 weeks  07/31/2024 )   1. Patient will demonstrate/report pain at worst less than or equal to 2/10 to facilitate minimal limitation in daily activity secondary to pain symptoms.  Goal status: Ongoing 06/10/2024   2. Patient will demonstrate independent use of home exercise program to facilitate ability to maintain/progress functional gains from skilled physical therapy services.  Goal status: New   3. Patient will demonstrate Patient specific functional scale avg > or = 8/10 to indicate reduced disability due to condition.   Goal status: ONGOING 07/01/24   4.  Patient will demonstrate Lt  LE MMT 5/5 throughout to faciltiate usual transfers, stairs, squatting at [facility] for daily life.   Goal status: Met 06/18/2024   5.  Patient will demonstrate Lt knee AROM 0-110 deg to  facilitate mobility for transfers, walking, stairs at PLOF.  Goal status: Ongoing 06/18/2024   6. Patient will demonstrate ascending/descending stairs reciprocally s UE assist for community integration.   Goal status: Ongoing 06/18/2024   7.  Patient will demonstrate independent ambulation > 500 ft for community ambulation , dog walking at PLOF.   Goal Status: Ongoing 06/18/2024   PLAN:  PT FREQUENCY: 1-2x/week  PT DURATION: 10 weeks  PLANNED INTERVENTIONS: Can include 02853- PT Re-evaluation, 97110-Therapeutic exercises, 97530- Therapeutic activity, [id number]- Neuromuscular re-education, 97535- Self Care, [id number]- Manual therapy, 02883-  Gait training, [phone]- Orthotic Fit/training, [id number]- Canalith repositioning, [id number]- Aquatic Therapy, [phone]- Electrical stimulation (unattended), [id number] Physical performance testing, [id number]- Vasopneumatic device, [id number]- Ultrasound, [id number]- Traction (mechanical), [id number]- Ionotophoresis 4mg /ml Dexamethasone ,  [id number] - Needle insertion w/o injection 1 or 2 muscles, [id number] - Needle insertion w/o injection 3 or more muscles.   Patient/Family education, Balance training, Stair training, Taping, Dry Needling, Joint mobilization, Joint manipulation, Spinal manipulation, Spinal mobilization, Scar mobilization, Vestibular training, Visual/preceptual remediation/compensation, DME instructions, Cryotherapy, and Moist heat.  All performed as medically necessary.  All included unless contraindicated  PLAN FOR NEXT SESSION: check goals, plan to hold PT    Corean JULIANNA Ku, PT, DPT 07/01/24 1:47 PM

## 2024-07-03 ENCOUNTER — Encounter: Admitting: Physical Therapy

## 2024-07-03 NOTE — Therapy (Incomplete)
 OUTPATIENT PHYSICAL THERAPY  TREATMENT NOTE DISCHARGE SUMMARY   Patient Name: Laurie Solomon MRN: [id number] DOB:1971-08-06, 53 y.o., female Today's Date: 07/03/2024      END OF SESSION:             Past Medical History:  Diagnosis Date   Agatston coronary artery calcium score less than 100    Allergic rhinitis    Allergy     Arthritis    Asthma    childhood   Chronic insomnia    Headache(784.0)    Hx of adenomatous polyp of colon 05/13/2021   diminutive x 1   Hypothyroidism 07/09/2015   Post traumatic stress disorder    Vitamin D  deficiency 07/09/2015   Past Surgical History:  Procedure Laterality Date   COLONOSCOPY W/ POLYPECTOMY  05/13/2021   REFRACTIVE SURGERY Bilateral 2010   Lasik   TOTAL KNEE ARTHROPLASTY Left 05/03/2024   Procedure: ARTHROPLASTY, KNEE, TOTAL;  Surgeon: Vernetta Lonni GRADE, MD;  Location: [facility];  Service: Orthopedics;  Laterality: Left;   WISDOM TOOTH EXTRACTION     Patient Active Problem List   Diagnosis Date Noted   Status post total left knee replacement 05/03/2024   Unilateral primary osteoarthritis, left knee 06/07/2023   HLD (hyperlipidemia) 06/05/2021   Hyperglycemia 06/01/2021   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   Depression 10/10/2020   Insect bite 10/10/2020   Right ear pain 10/08/2020   ETD (Eustachian tube dysfunction), bilateral 10/05/2020   Non-recurrent acute suppurative otitis media of right ear with spontaneous rupture of tympanic membrane 10/05/2020   Rhinitis, chronic 10/05/2020   Chronic cholecystitis 04/29/2020   Epigastric pain 12/03/2018   Snoring 10/13/2016   Asthma with exacerbation 10/13/2016   Vitamin D  deficiency 07/09/2015   Hypothyroidism 07/09/2015   Morbid obesity (HCC) 01/18/2013   Encounter for well adult exam with abnormal findings 05/02/2011   Left knee pain 05/02/2011   Seasonal and perennial allergic rhinitis 03/01/2010   SNORING 03/01/2010    Allergic-infective asthma 12/16/2008   INSOMNIA, CHRONIC 07/25/2007   PTSD 07/25/2007   Headache(784.0) 07/25/2007    PCP: Norleen Lynwood MOHR MD  REFERRING PROVIDER: Vernetta Lonni GRADE, MD  REFERRING DIAG: [phone] (ICD-10-CM) - Status post total left knee replacement  THERAPY DIAG:  No diagnosis found.  Rationale for Evaluation and Treatment: Rehabilitation  ONSET DATE: Lt TKA 05/03/2024  SUBJECTIVE:   SUBJECTIVE STATEMENT:  *** Knee is stiff but not a lot of pain.     PERTINENT HISTORY: Arrhritis.    PAIN:  NPRS scale: no pain.  Pain location: Lt knee Pain description: tightness, ache.  Aggravating factors: WB pressure, end range flexion, static positioning.  Relieving factors: medicine, Ice   PRECAUTIONS: None  WEIGHT BEARING RESTRICTIONS: No  FALLS:  Has patient fallen in last 6 months? No  LIVING ENVIRONMENT: Lives in: House/apartment Stairs: Child psychotherapist on main floor, has stairs.  2 to enter from garage.  Has following equipment at home: FWW, SPC, elevated toilet seat  OCCUPATION: Remote work from home, part time.   PLOF: Independent, walking dog for exercise.   PATIENT GOALS: Reduce pain, dog walking   OBJECTIVE:   PATIENT SURVEYS:  Patient-Specific Activity Scoring Scheme  0 represents "unable to perform." 10 represents "able to perform at prior level. 0 1 2 3 4 5 6 7 8 9  10 (Date and Score)   Activity Eval  05/22/2024 06/18/2024  07/01/24  1. Dog walking  0  5 5  2. Putting on  pants  8  10 10   3. Steps  1  6  4.      5.     Score 3 avg  7   Total score = sum of the activity scores/number of activities Minimum detectable change (90%CI) for average score = 2 points Minimum detectable change (90%CI) for single activity score = 3 points  COGNITION: 05/22/2024 Overall cognitive status: WFL    SENSATION: 05/22/2024 WFl  EDEMA:  05/22/2024 Localized edema noted in Lt leg.  Lt knee joint 14.5 inch  Rt knee joint 13.2 inch  MUSCLE  LENGTH: 05/22/2024 No specific testing  POSTURE:  05/22/2024 Weight shift to Rt in standing.   PALPATION: 05/22/2024 General tenderness around knee Lt.   LOWER EXTREMITY MMT:   ROM Right Eval 05/22/2024 Left Eval 05/22/2024 Right 06/18/2024 Left 06/18/2024  Hip flexion 5/5 4/5    Hip extension      Hip abduction      Hip adduction      Hip internal rotation      Hip external rotation      Knee flexion 5/5 3+/5 5/5 5/5  Knee extension 5/5 4/5 5/5 43.9, 43.5 5/5 31, 31 lbs  Ankle dorsiflexion      Ankle plantarflexion      Ankle inversion      Ankle eversion       (Blank rows = not tested)  LOWER EXTREMITY ROM:  ROM Right Eval 05/22/2024 Left Eval 05/22/2024 Left 05/27/2024 Left 06/11/2024 06/18/2024 Left 07/01/24 Left  Hip flexion        Hip extension        Hip abduction        Hip adduction        Hip internal rotation        Hip external rotation        Knee flexion   Supine AROM heel slide 90 100 Active 105 AROM A: 107 AA:: 110  Knee extension  AROM in seated LAQ -16  Passive heel prop  -9 AROM in setaed LAQ -5  -4 passive heel prop -3 Active 0 in AROM seated LAQ   Ankle dorsiflexion        Ankle plantarflexion        Ankle inversion        Ankle eversion         (Blank rows = not tested)  LOWER EXTREMITY SPECIAL TESTS:  05/22/2024 No specific testing.   FUNCTIONAL TESTS:  06/18/2024  05/22/2024 18 inch chair transfer: no UE but Rt leg dominant movement.  Lt SLS: unable  Rt SLS: not tested  GAIT: 06/18/2024: independent ambulation in clinic and reported on own.   05/27/2024: SPC use in clinic with mod independence.  Able to demonstrate independent ambulation as well.   05/22/2024 FWW use in clinic household distances.  Reduced stance on Lt, lacking TKE in stance.  TODAY'S  TREATMENT 07/03/24 ***  07/01/24 TherEx Recumbent bike no resistance to L3 x 8 min; seat 7 Knee extension machine 10# bil concentric, LLE eccentric 3x10 Hamstring curls 20# 3x10 AA heel slides 2x10 with ball ROM measurements - see above for details  TherAct TRX squats 2x10 TRX reverse lunges 2x10 reps bil alternating LLE on 6 step with Rt heel taps 2x10 LLE step up and over 2x10    06/26/24 TherEx UBE LEs only L3 x 8 min Educated on LLLD flexion stretch at home to help maximize flexion   TherAct LLE on 6 step with Rt heel taps 2x10 Forward step ups onto 6 step 2x10 bil Leg press 100# 3x10 with flexion holds x 30 sec between sets; single limb 50# 3x10 bil Sit to/from stand 2x10; 12# KB    06/20/24 TherEx Nustep 8 min level 3 Incline gastroc stretch 30 sec x 3 bilateral   Neuro Re-ed Standing heel/toe alternating on foam x 15 each way with occasional HHA  Walking on air ex 6 laps   TherActivity Leg press double leg 100 lbs 10x3  slow lowering focus, single leg 50 lbs 2 x 10 bilaterally  Steps in // bars 6 inch  Vaso Lt knee in elevation 10 mins medium compression 34 degrees.     06/18/2024 TherEx UBE fwd/back ROM for knee lvl 3.5 10 mins (ended up more forward) Incline gastroc stretch 30 sec x 3 bilateral   Neuro Re-ed SLS with slider fwd, side, reverse with focus on WB on single leg with light touch on slider x 8 each way bilaterally - occasional HHA on bar Standing heel/toe alternating on foam x 15 each way with occasional HHA  Tep down off 6 inch step leading with the R   TherActivity Flight of stairs reciprocal gait pattern up/down with Rt hand rail going down, Lt going up for household simulation x 1 each way with SBA Leg press double leg 112 lbs x 15 slow lowering focus, single leg 56 lbs 2 x 15 bilaterally   Vaso Lt knee in elevation 10 mins medium compression 34 degrees.    PATIENT EDUCATION:  06/18/2024 Education details: HEP  update Person educated: Patient Education method: Programmer, multimedia, Demonstration, Verbal cues, and Handouts Education comprehension: verbalized understanding, returned demonstration, and verbal cues required  HOME EXERCISE PROGRAM: Access Code: [email or web address] URL: [email or web address] Date: 06/18/2024 Prepared by: Ozell Silvan  Exercises - Supine Heel Slide (Mirrored)  - 3-5 x daily - 7 x weekly - 1 sets - 10 reps - 5 hold - Supine Heel Slide with Strap  - 3-5 x daily - 7 x weekly - 1 sets - 10 reps - 5 hold - Seated Long Arc Quad (Mirrored)  - 3-5 x daily - 7 x weekly - 1 sets - 5-10 reps - 2 hold - Supine Knee Extension Mobilization with Weight (Mirrored)  - 4-5 x daily - 7 x weekly - 1 sets - 1 reps - to tolerance up to 5 mins hold - Seated Quad Set (Mirrored)  - 3-5 x daily - 7 x weekly - 1 sets - 10 reps - 5 hold - Seated Knee Flexion Extension AAROM with Overpressure (Mirrored)  - 2-3 x daily - 7 x weekly - 1 sets - 5 reps - 10 hold - Seated SLR (Mirrored)  - 1-2 x daily - 7 x weekly - 1-2 sets - 10-15 reps - 2 hold  ASSESSMENT:  CLINICAL IMPRESSION:  Pt continues to do well with PT with  excellent ROM and functional progress that's been made.  Anticipate she will be ready to hold PT after next visit.    OBJECTIVE IMPAIRMENTS: Abnormal gait, decreased activity tolerance, decreased balance, decreased coordination, decreased endurance, decreased mobility, difficulty walking, decreased ROM, decreased strength, hypomobility, increased edema, increased fascial restrictions, impaired perceived functional ability, increased muscle spasms, impaired flexibility, improper body mechanics, and pain.   ACTIVITY LIMITATIONS: carrying, lifting, bending, sitting, standing, squatting, sleeping, stairs, transfers, bed mobility, bathing, toileting, dressing, hygiene/grooming, and locomotion level  PARTICIPATION LIMITATIONS: meal prep, cleaning, laundry, interpersonal relationship, driving,  shopping, community activity, occupation, and yard work  PERSONAL FACTORS: no specific factors are also affecting patient's functional outcome.   REHAB POTENTIAL: Good  CLINICAL DECISION MAKING: Stable/uncomplicated  EVALUATION COMPLEXITY: Low   GOALS: Goals reviewed with patient? Yes  SHORT TERM GOALS: (target date for Short term goals are 3 weeks 06/12/2024)   1.  Patient will demonstrate independent use of home exercise program to maintain progress from in clinic treatments.  Goal status: Met 06/10/2024  LONG TERM GOALS: (target dates for all long term goals are 10 weeks  07/31/2024 )   1. Patient will demonstrate/report pain at worst less than or equal to 2/10 to facilitate minimal limitation in daily activity secondary to pain symptoms.  Goal status: *** 07/03/24   2. Patient will demonstrate independent use of home exercise program to facilitate ability to maintain/progress functional gains from skilled physical therapy services.  Goal status: *** 07/03/24   3. Patient will demonstrate Patient specific functional scale avg > or = 8/10 to indicate reduced disability due to condition.   Goal status: *** 07/03/24   4.  Patient will demonstrate Lt  LE MMT 5/5 throughout to faciltiate usual transfers, stairs, squatting at [facility] for daily life.   Goal status: Met 06/18/2024   5.  Patient will demonstrate Lt knee AROM 0-110 deg to facilitate mobility for transfers, walking, stairs at PLOF.  Goal status: Ongoing 06/18/2024   6. Patient will demonstrate ascending/descending stairs reciprocally s UE assist for community integration.   Goal status: *** 07/03/24   7.  Patient will demonstrate independent ambulation > 500 ft for community ambulation , dog walking at PLOF.   Goal Status: *** 07/03/24   PLAN:  PT FREQUENCY: 1-2x/week  PT DURATION: 10 weeks  PLANNED INTERVENTIONS: Can include 02853- PT Re-evaluation, 97110-Therapeutic exercises, 97530- Therapeutic activity, 97112-  Neuromuscular re-education, 97535- Self Care, 97140- Manual therapy, [phone]- Gait training, [phone]- Orthotic Fit/training, [phone]- Canalith repositioning, [id number]- Aquatic Therapy, [phone]- Electrical stimulation (unattended), [id number] Physical performance testing, 97016- Vasopneumatic device, [id number]- Ultrasound, [id number]- Traction (mechanical), [id number]- Ionotophoresis 4mg /ml Dexamethasone ,  [id number] - Needle insertion w/o injection 1 or 2 muscles, 20561 - Needle insertion w/o injection 3 or more muscles.   Patient/Family education, Balance training, Stair training, Taping, Dry Needling, Joint mobilization, Joint manipulation, Spinal manipulation, Spinal mobilization, Scar mobilization, Vestibular training, Visual/preceptual remediation/compensation, DME instructions, Cryotherapy, and Moist heat.  All performed as medically necessary.  All included unless contraindicated  PLAN FOR NEXT SESSION: *** check goals, plan to hold PT    Corean JULIANNA Ku, PT, DPT 07/03/24 7:32 AM      PHYSICAL THERAPY DISCHARGE SUMMARY  Visits from Start of Care: ***  Current functional level related to goals / functional outcomes: ***   Remaining deficits: ***   Education / Equipment: ***   Patient agrees to discharge. Patient goals were {OP Goals:25702::met}. Patient is being discharged due to {OP Discharge Reasons:25703::meeting the stated rehab goals.}

## 2024-07-09 ENCOUNTER — Encounter: Admitting: Rehabilitative and Restorative Service Providers"

## 2024-07-10 ENCOUNTER — Other Ambulatory Visit: Payer: Self-pay | Admitting: Internal Medicine

## 2024-07-11 ENCOUNTER — Encounter: Admitting: Internal Medicine

## 2024-07-11 ENCOUNTER — Encounter: Admitting: Rehabilitative and Restorative Service Providers"

## 2024-07-11 ENCOUNTER — Encounter: Payer: Self-pay | Admitting: Rehabilitative and Restorative Service Providers"

## 2024-07-11 ENCOUNTER — Ambulatory Visit: Admitting: Rehabilitative and Restorative Service Providers"

## 2024-07-11 DIAGNOSIS — R600 Localized edema: Secondary | ICD-10-CM

## 2024-07-11 DIAGNOSIS — R262 Difficulty in walking, not elsewhere classified: Secondary | ICD-10-CM

## 2024-07-11 DIAGNOSIS — M25662 Stiffness of left knee, not elsewhere classified: Secondary | ICD-10-CM | POA: Diagnosis not present

## 2024-07-11 DIAGNOSIS — M25562 Pain in left knee: Secondary | ICD-10-CM

## 2024-07-11 DIAGNOSIS — M6281 Muscle weakness (generalized): Secondary | ICD-10-CM | POA: Diagnosis not present

## 2024-07-11 DIAGNOSIS — G8929 Other chronic pain: Secondary | ICD-10-CM

## 2024-07-11 NOTE — Therapy (Cosign Needed)
 OUTPATIENT PHYSICAL THERAPY  TREATMENT NOTE & DISCHARGE   Patient Name: Laurie Solomon MRN: [id number] DOB:04-Jun-1971, 53 y.o., female Today's Date: 07/11/2024  PHYSICAL THERAPY DISCHARGE SUMMARY  Visits from Start of Care: 11  Current functional level related to goals / functional outcomes: See note   Remaining deficits: See note   Education / Equipment: HEP  Patient goals were met. Patient is being discharged due to being pleased with the current functional level.     END OF SESSION:  PT End of Session - 07/11/24 1307     Visit Number 11    Number of Visits 20    Date for PT Re-Evaluation 07/31/24    Authorization Type [employer] $52 copay    Progress Note Due on Visit 17    PT Start Time 1304    PT Stop Time 1345    PT Time Calculation (min) 41 min    Activity Tolerance Patient tolerated treatment well;No increased pain    Behavior During Therapy [facility] for tasks assessed/performed                    Past Medical History:  Diagnosis Date   Agatston coronary artery calcium score less than 100    Allergic rhinitis    Allergy     Arthritis    Asthma    childhood   Chronic insomnia    Headache(784.0)    Hx of adenomatous polyp of colon 05/13/2021   diminutive x 1   Hypothyroidism 07/09/2015   Post traumatic stress disorder    Vitamin D  deficiency 07/09/2015   Past Surgical History:  Procedure Laterality Date   COLONOSCOPY W/ POLYPECTOMY  05/13/2021   REFRACTIVE SURGERY Bilateral 2010   Lasik   TOTAL KNEE ARTHROPLASTY Left 05/03/2024   Procedure: ARTHROPLASTY, KNEE, TOTAL;  Surgeon: Vernetta Lonni GRADE, MD;  Location: [facility];  Service: Orthopedics;  Laterality: Left;   WISDOM TOOTH EXTRACTION     Patient Active Problem List   Diagnosis Date Noted   Status post total left knee replacement 05/03/2024   Unilateral primary osteoarthritis, left knee 06/07/2023   HLD (hyperlipidemia) 06/05/2021   Hyperglycemia 06/01/2021   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 05/31/2021   Depression 10/10/2020   Insect bite 10/10/2020   Right ear pain 10/08/2020   ETD (Eustachian tube dysfunction), bilateral 10/05/2020   Non-recurrent acute suppurative otitis media of right ear with spontaneous rupture of tympanic membrane 10/05/2020   Rhinitis, chronic 10/05/2020   Chronic cholecystitis 04/29/2020   Epigastric pain 12/03/2018   Snoring 10/13/2016   Asthma with exacerbation 10/13/2016   Vitamin D  deficiency 07/09/2015   Hypothyroidism 07/09/2015   Morbid obesity (HCC) 01/18/2013   Encounter for well adult exam with abnormal findings 05/02/2011   Left knee pain 05/02/2011   Seasonal and perennial allergic rhinitis 03/01/2010   SNORING 03/01/2010   Allergic-infective asthma 12/16/2008   INSOMNIA, CHRONIC 07/25/2007   PTSD 07/25/2007   Headache(784.0) 07/25/2007    PCP: Norleen Lynwood MOHR MD  REFERRING PROVIDER: Vernetta Lonni GRADE, MD  REFERRING DIAG: [phone] (ICD-10-CM) - Status post total left knee replacement  THERAPY DIAG:  Chronic pain of left knee  Stiffness of left knee, not elsewhere classified  Localized edema  Muscle weakness (generalized)  Difficulty in walking, not elsewhere classified  Rationale for Evaluation and Treatment: Rehabilitation  ONSET DATE: Lt TKA 05/03/2024  SUBJECTIVE:   SUBJECTIVE STATEMENT: Patient overall doing well. Patient reports occasional stiffness.  Reported ready  for discharge.    PERTINENT HISTORY: Arrhritis.    PAIN:  NPRS scale: no pain.  Pain location: Lt knee Pain description: tightness, ache.  Aggravating factors: WB pressure, end range flexion, static positioning.  Relieving factors: medicine, Ice   PRECAUTIONS: None  WEIGHT BEARING RESTRICTIONS: No  FALLS:  Has patient fallen in last 6 months? No  LIVING ENVIRONMENT: Lives in: House/apartment Stairs: Child psychotherapist on main floor, has stairs.  2 to enter from garage.  Has following equipment  at home: FWW, SPC, elevated toilet seat  OCCUPATION: Remote work from home, part time.   PLOF: Independent, walking dog for exercise.   PATIENT GOALS: Reduce pain, dog walking   OBJECTIVE:   PATIENT SURVEYS:  Patient-Specific Activity Scoring Scheme  0 represents "unable to perform." 10 represents "able to perform at prior level. 0 1 2 3 4 5 6 7 8 9  10 (Date and Score)   Activity Eval  05/22/2024 06/18/2024  07/01/24 07/11/2024  1. Dog walking  0  5 5 7   2. Putting on pants  8  10 10 10   3. Steps  1  6 8   4.       5.      Score 3 avg  7 8.33   Total score = sum of the activity scores/number of activities Minimum detectable change (90%CI) for average score = 2 points Minimum detectable change (90%CI) for single activity score = 3 points  COGNITION: 05/22/2024 Overall cognitive status: WFL    SENSATION: 05/22/2024 WFl  EDEMA:  05/22/2024 Localized edema noted in Lt leg.  Lt knee joint 14.5 inch  Rt knee joint 13.2 inch  MUSCLE LENGTH: 05/22/2024 No specific testing  POSTURE:  05/22/2024 Weight shift to Rt in standing.   PALPATION: 05/22/2024 General tenderness around knee Lt.   LOWER EXTREMITY MMT:   ROM Right Eval 05/22/2024 Left Eval 05/22/2024 Right 06/18/2024 Left 06/18/2024 Left 07/11/2024  Hip flexion 5/5 4/5     Hip extension       Hip abduction       Hip adduction       Hip internal rotation       Hip external rotation       Knee flexion 5/5 3+/5 5/5 5/5   Knee extension 5/5 4/5 5/5 43.9, 43.5 5/5 31, 31 lbs 5/5 43.7, 41.6 lbs  Ankle dorsiflexion       Ankle plantarflexion       Ankle inversion       Ankle eversion        (Blank rows = not tested)  LOWER EXTREMITY ROM:  ROM Right Eval 05/22/2024 Left Eval 05/22/2024 Left 05/27/2024 Left 06/11/2024 06/18/2024 Left 07/01/24 Left 07/11/2024  Hip flexion         Hip extension         Hip abduction         Hip adduction         Hip internal rotation         Hip external rotation          Knee flexion   Supine AROM heel slide 90 100 Active 105 AROM A: 107 AA:: 110 Supine A:112*  Knee extension  AROM in seated LAQ -16  Passive heel prop  -9 AROM in setaed LAQ -5  -4 passive heel prop -3 Active 0 in AROM seated LAQ  Supine A: 0*  Ankle dorsiflexion         Ankle plantarflexion  Ankle inversion         Ankle eversion          (Blank rows = not tested)  LOWER EXTREMITY SPECIAL TESTS:  05/22/2024 No specific testing.   FUNCTIONAL TESTS:  06/18/2024  05/22/2024 18 inch chair transfer: no UE but Rt leg dominant movement.  Lt SLS: unable  Rt SLS: not tested  GAIT: 06/18/2024: independent ambulation in clinic and reported on own.   05/27/2024: SPC use in clinic with mod independence.  Able to demonstrate independent ambulation as well.   05/22/2024 FWW use in clinic household distances.  Reduced stance on Lt, lacking TKE in stance.                                                                                                                                                                         TODAY'S TREATMENT 07/11/24 TherEx Recumbent bike, seat 7, level 3 for 8 minutes  Knee extension machine, 10# 1x10 Knee extension machine, 10# 1x10 bilateral concentric, Lt LE only eccentric Knee flexion machine, 25# 2x10 Handout provided for HEP with verbal review.   TherAct Reciprocal pattern going up/down flight of stairs. Patient instructed in how to use bottom stair to do a lateral step up/down Double leg press: 106#, 2x15 for facilitation of STS Single leg press: 56#, 2x15 for facilitation of STS  ROM and MMT measurements taken. See objectives   TREATMENT 07/01/24 TherEx Recumbent bike no resistance to L3 x 8 min; seat 7 Knee extension machine 10# bil concentric, LLE eccentric 3x10 Hamstring curls 20# 3x10 AA heel slides 2x10 with ball ROM measurements - see above for details  TherAct TRX squats 2x10 TRX reverse lunges 2x10 reps bil alternating LLE  on 6 step with Rt heel taps 2x10 LLE step up and over 2x10    06/26/24 TherEx UBE LEs only L3 x 8 min Educated on LLLD flexion stretch at home to help maximize flexion   TherAct LLE on 6 step with Rt heel taps 2x10 Forward step ups onto 6 step 2x10 bil Leg press 100# 3x10 with flexion holds x 30 sec between sets; single limb 50# 3x10 bil Sit to/from stand 2x10; 12# KB    06/20/24 TherEx Nustep 8 min level 3 Incline gastroc stretch 30 sec x 3 bilateral   Neuro Re-ed Standing heel/toe alternating on foam x 15 each way with occasional HHA  Walking on air ex 6 laps   TherActivity Leg press double leg 100 lbs 10x3  slow lowering focus, single leg 50 lbs 2 x 10 bilaterally  Steps in // bars 6 inch  Vaso Lt knee in elevation 10 mins medium compression 34 degrees.   PATIENT EDUCATION:  06/18/2024 Education details: HEP update Person  educated: Patient Education method: Explanation, Demonstration, Verbal cues, and Handouts Education comprehension: verbalized understanding, returned demonstration, and verbal cues required  HOME EXERCISE PROGRAM: Access Code: [email or web address] URL: [email or web address] Date: 07/11/2024 Prepared by: Ozell Silvan   Exercises - Supine Heel Slide (Mirrored)  - 3-5 x daily - 7 x weekly - 1 sets - 10 reps - 5 hold - Seated Long Arc Quad (Mirrored)  - 3-5 x daily - 7 x weekly - 1 sets - 5-10 reps - 2 hold - Supine Knee Extension Mobilization with Weight (Mirrored)  - 4-5 x daily - 7 x weekly - 1 sets - 1 reps - to tolerance up to 5 mins hold - Seated Quad Set (Mirrored)  - 3-5 x daily - 7 x weekly - 1 sets - 10 reps - 5 hold - Seated Knee Flexion Extension AAROM with Overpressure (Mirrored)  - 2-3 x daily - 7 x weekly - 1 sets - 5 reps - 10 hold - Seated SLR (Mirrored)  - 1-2 x daily - 7 x weekly - 1-2 sets - 10-15 reps - 2 hold - Lateral Step Down  - 1 x daily - 7 x weekly - 2-3 sets - 10 reps  ASSESSMENT:  CLINICAL IMPRESSION:  Patient did well today. Patient met all her LTGs today showing an improvement with functional activities, ADLs, and transfers. Patient has a good understanding of her HEP. She understands how to progress exercises when needed and how to monitor symptoms. Patient feels comfortable with discharge.  OBJECTIVE IMPAIRMENTS: Abnormal gait, decreased activity tolerance, decreased balance, decreased coordination, decreased endurance, decreased mobility, difficulty walking, decreased ROM, decreased strength, hypomobility, increased edema, increased fascial restrictions, impaired perceived functional ability, increased muscle spasms, impaired flexibility, improper body mechanics, and pain.   ACTIVITY LIMITATIONS: carrying, lifting, bending, sitting, standing, squatting, sleeping, stairs, transfers, bed mobility, bathing, toileting, dressing, hygiene/grooming, and locomotion level  PARTICIPATION LIMITATIONS: meal prep, cleaning, laundry, interpersonal relationship, driving, shopping, community activity, occupation, and yard work  PERSONAL FACTORS: no specific factors are also affecting patient's functional outcome.   REHAB POTENTIAL: Good  CLINICAL DECISION MAKING: Stable/uncomplicated  EVALUATION COMPLEXITY: Low   GOALS: Goals reviewed with patient? Yes  SHORT TERM GOALS: (target date for Short term goals are 3 weeks 06/12/2024)   1.  Patient will demonstrate independent use of home exercise program to maintain progress from in clinic treatments.  Goal status: Met 06/10/2024  LONG TERM GOALS: (target dates for all long term goals are 10 weeks  07/31/2024 )   1. Patient will demonstrate/report pain at worst less than or equal to 2/10 to facilitate minimal limitation in daily activity secondary to pain symptoms.  Goal status: MET, 07/11/2024   2. Patient will demonstrate independent use of home exercise program to facilitate ability to maintain/progress functional gains from skilled physical therapy  services.  Goal status: MET, 07/11/2024   3. Patient will demonstrate Patient specific functional scale avg > or = 8/10 to indicate reduced disability due to condition.   Goal status: MET, 07/11/2024   4.  Patient will demonstrate Lt  LE MMT 5/5 throughout to faciltiate usual transfers, stairs, squatting at [facility] for daily life.   Goal status: MET, 07/11/2024   5.  Patient will demonstrate Lt knee AROM 0-110 deg to facilitate mobility for transfers, walking, stairs at PLOF.  Goal status: MET, 07/11/2024   6. Patient will demonstrate ascending/descending stairs reciprocally s UE assist for community integration.   Goal status: MET, 07/11/2024   7.  Patient  will demonstrate independent ambulation > 500 ft for community ambulation , dog walking at PLOF.   Goal Status: MET, 07/11/2024   PLAN:  PT FREQUENCY: 1-2x/week  PT DURATION: 10 weeks  PLANNED INTERVENTIONS: Can include 02853- PT Re-evaluation, 97110-Therapeutic exercises, 97530- Therapeutic activity, 97112- Neuromuscular re-education, 97535- Self Care, 97140- Manual therapy, [phone]- Gait training, [phone]- Orthotic Fit/training, [phone]- Canalith repositioning, [id number]- Aquatic Therapy, [phone]- Electrical stimulation (unattended), [id number] Physical performance testing, 97016- Vasopneumatic device, [id number]- Ultrasound, [id number]- Traction (mechanical), [id number]- Ionotophoresis 4mg /ml Dexamethasone ,  79439 - Needle insertion w/o injection 1 or 2 muscles, 20561 - Needle insertion w/o injection 3 or more muscles.   Patient/Family education, Balance training, Stair training, Taping, Dry Needling, Joint mobilization, Joint manipulation, Spinal manipulation, Spinal mobilization, Scar mobilization, Vestibular training, Visual/preceptual remediation/compensation, DME instructions, Cryotherapy, and Moist heat.  All performed as medically necessary.  All included unless contraindicated  PLAN FOR NEXT SESSION: discharge    Ismael Theophilus Stallion, SPT 07/11/24 4:06  PM

## 2024-07-12 ENCOUNTER — Encounter: Admitting: Internal Medicine

## 2024-07-16 ENCOUNTER — Encounter: Payer: Self-pay | Admitting: Internal Medicine

## 2024-07-16 ENCOUNTER — Ambulatory Visit (INDEPENDENT_AMBULATORY_CARE_PROVIDER_SITE_OTHER): Admitting: Internal Medicine

## 2024-07-16 VITALS — BP 126/88 | HR 85 | Temp 98.5°F | Ht 67.5 in | Wt 161.6 lb

## 2024-07-16 DIAGNOSIS — Z23 Encounter for immunization: Secondary | ICD-10-CM

## 2024-07-16 DIAGNOSIS — R739 Hyperglycemia, unspecified: Secondary | ICD-10-CM

## 2024-07-16 DIAGNOSIS — E559 Vitamin D deficiency, unspecified: Secondary | ICD-10-CM

## 2024-07-16 DIAGNOSIS — E785 Hyperlipidemia, unspecified: Secondary | ICD-10-CM | POA: Diagnosis not present

## 2024-07-16 DIAGNOSIS — G2581 Restless legs syndrome: Secondary | ICD-10-CM

## 2024-07-16 DIAGNOSIS — Z0001 Encounter for general adult medical examination with abnormal findings: Secondary | ICD-10-CM | POA: Diagnosis not present

## 2024-07-16 DIAGNOSIS — E7800 Pure hypercholesterolemia, unspecified: Secondary | ICD-10-CM

## 2024-07-16 DIAGNOSIS — E538 Deficiency of other specified B group vitamins: Secondary | ICD-10-CM | POA: Diagnosis not present

## 2024-07-16 MED ORDER — MINOXIDIL 2.5 MG PO TABS: 2.5000 mg | ORAL_TABLET | Freq: Every day | ORAL | 1 refills | Status: AC

## 2024-07-16 MED ORDER — BUPROPION HCL ER (XL) 300 MG PO TB24: 300.0000 mg | ORAL_TABLET | Freq: Every day | ORAL | 1 refills | Status: AC

## 2024-07-16 MED ORDER — LEVOTHYROXINE SODIUM 25 MCG PO TABS: ORAL_TABLET | ORAL | 3 refills | Status: AC

## 2024-07-16 MED ORDER — GABAPENTIN 300 MG PO CAPS: ORAL_CAPSULE | ORAL | 1 refills | Status: AC

## 2024-07-16 NOTE — Patient Instructions (Signed)
 You had the flu shot today  Please take OTC Vitamin D3 at 2000 units per day, indefinitely  Ok to try changing the trazodone and klonopin  to gabapentin  300 mg - 1-2 tabs at bedtime  Please continue all other medications as before, and refills have been done if requested.  Please have the pharmacy call with any other refills you may need.  Please continue your efforts at being more active, low cholesterol diet, and weight control.  You are otherwise up to date with prevention measures today.  Please keep your appointments with your specialists as you may have planned  Please make an Appointment to return for your 1 year visit, or sooner if needed, with Lab testing by Appointment as well, to be done about 3-5 days before at the FIRST FLOOR Lab (so this is for TWO appointments - please see the scheduling desk as you leave)

## 2024-07-16 NOTE — Assessment & Plan Note (Signed)
 Last vitamin D  Lab Results  Component Value Date   VD25OH 33.38 07/01/2024   Low, to start oral replacement

## 2024-07-16 NOTE — Progress Notes (Signed)
 Patient ID: Laurie Solomon, female   DOB: [birth year], 53 y.o.   MRN: [id number]         Chief Complaint:: wellness exam and RLS and insomnia, low vit d, s/p recent left TKR doing well, hld       HPI:  Evann Koelzer is a 53 y.o. female here for wellness exam, for flu shot, declines hep b vax, for prevnar 20 at pharmacy, o/w up to date                        Also Pt denies chest pain, increased sob or doe, wheezing, orthopnea, PND, increased LE swelling, palpitations, dizziness or syncope.   Pt denies polydipsia, polyuria, or new focal neuro s/s.    Pt denies fever, wt loss, night sweats, loss of appetite, or other constitutional symptoms  Also has mild worsening RLS symptoms and difficutly getting to sleep, where trazodon and klonopin  just not working as well.     Wt Readings from Last 3 Encounters:  07/16/24 161 lb 9.6 oz (73.3 kg)  05/03/24 169 lb 12.1 oz (77 kg)  04/29/24 171 lb (77.6 kg)   BP Readings from Last 3 Encounters:  07/16/24 126/88  05/05/24 130/67  04/29/24 120/72   Immunization History  Administered Date(s) Administered   Fluzone Influenza virus vaccine,trivalent (IIV3), split virus 07/24/2008   H1N1 08/07/2008, 10/07/2008   Hepatitis A, Adult 03/07/2018   Influenza Split 07/25/2011, 08/21/2012, 08/28/2022   Influenza Whole 08/07/2010   Influenza, Mdck, Trivalent,PF 6+ MOS(egg free) 07/22/2023   Influenza, Seasonal, Injecte, Preservative Fre 07/16/2024   Influenza,inj,Quad PF,6+ Mos 09/09/2013, 07/09/2015, 10/13/2016, 09/21/2017, 07/30/2018, 08/15/2019   Influenza-Unspecified 09/07/2017, 07/17/2020, 07/21/2021, 08/29/2021   Moderna Covid-19 Fall Seasonal Vaccine 16yrs & older 07/22/2023   Moderna Sars-Covid-2 Vaccination 01/23/2020, 02/25/2020   PFIZER Comirnaty(Gray Top)Covid-19 Tri-Sucrose Vaccine 04/27/2021   PFIZER(Purple Top)SARS-COV-2 Vaccination 10/21/2020   Pfizer Covid-19 Vaccine Bivalent Booster 79yrs & up 08/29/2021   Pfizer(Comirnaty)Fall Seasonal Vaccine  12 years and older 08/28/2022   Td 10/07/2008   Tdap 10/07/2008, 03/07/2018   Typhoid Live 03/06/2018   Zoster Recombinant(Shingrix) 07/11/2022, 08/26/2022, 08/28/2022   Health Maintenance Due  Topic Date Due   Pneumococcal Vaccine: 50+ Years (1 of 2 - PCV) Never done   Hepatitis B Vaccines 19-59 Average Risk (1 of 3 - 19+ 3-dose series) Never done   Cervical Cancer Screening (HPV/Pap Cotest)  10/07/2013      Past Medical History:  Diagnosis Date   Agatston coronary artery calcium score less than 100    Allergic rhinitis    Allergy     Arthritis    Asthma    childhood   Chronic insomnia    Depression 1991   [employer]   Headache(784.0)    Hx of adenomatous polyp of colon 05/13/2021   diminutive x 1   Hypothyroidism 07/09/2015   Post traumatic stress disorder    Vitamin D  deficiency 07/09/2015   Past Surgical History:  Procedure Laterality Date   COLONOSCOPY W/ POLYPECTOMY  05/13/2021   REFRACTIVE SURGERY Bilateral 2010   Lasik   TOTAL KNEE ARTHROPLASTY Left 05/03/2024   Procedure: ARTHROPLASTY, KNEE, TOTAL;  Surgeon: Vernetta Lonni GRADE, MD;  Location: [facility];  Service: Orthopedics;  Laterality: Left;   WISDOM TOOTH EXTRACTION      reports that she has never smoked. She has never used smokeless tobacco. She reports current alcohol use of about 2.0 standard drinks of alcohol per week. She reports that she does not  use drugs. family history includes Alcohol abuse in her paternal grandfather; Breast cancer in some other family members; Diabetes in her maternal aunt, maternal grandmother, and paternal grandfather; Heart disease in her maternal grandfather and maternal uncle; Hypertension in her maternal grandfather. No Known Allergies Current Outpatient Medications on File Prior to Visit  Medication Sig Dispense Refill   calcium carbonate (OSCAL) 1500 (600 Ca) MG TABS tablet Take 600 mg of elemental calcium by mouth 2 (two) times daily with a meal.     estradiol (ESTRACE)  0.1 MG/GM vaginal cream Place 1 g vaginally daily as needed (irritation).     IUD'S IU 1 Intra Uterine Device by Intrauterine route once.     loratadine  (CLARITIN ) 10 MG tablet Take 10 mg by mouth daily.     Prenatal Vit-Fe Fumarate-FA (PRENATAL MULTIVITAMIN) TABS tablet Take 1 tablet by mouth daily at 12 noon.     Probiotic CHEW Chew 2 tablets by mouth daily.     Semaglutide , 2 MG/DOSE, (OZEMPIC , 2 MG/DOSE,) 8 MG/3ML SOPN Inject 2 mg into the skin once a week.     tretinoin  (RETIN-A ) 0.1 % cream Apply 1 application  topically at bedtime. (Patient taking differently: Apply 1 application  topically at bedtime as needed (acne).) 45 g 1   No current facility-administered medications on file prior to visit.        ROS:  All others reviewed and negative.  Objective        PE:  BP 126/88   Pulse 85   Temp 98.5 F (36.9 C)   Ht 5' 7.5 (1.715 m)   Wt 161 lb 9.6 oz (73.3 kg)   SpO2 96%   BMI 24.94 kg/m                 Constitutional: Pt appears in NAD               HENT: Head: NCAT.                Right Ear: External ear normal.                 Left Ear: External ear normal.                Eyes: . Pupils are equal, round, and reactive to light. Conjunctivae and EOM are normal               Nose: without d/c or deformity               Neck: Neck supple. Gross normal ROM               Cardiovascular: Normal rate and regular rhythm.                 Pulmonary/Chest: Effort normal and breath sounds without rales or wheezing.                Abd:  Soft, NT, ND, + BS, no organomegaly               Neurological: Pt is alert. At baseline orientation, motor grossly intact               Skin: Skin is warm. No rashes, no other new lesions, LE edema - none               Psychiatric: Pt behavior is normal without agitation   Micro: none  Cardiac tracings I have personally interpreted today:  none  Pertinent Radiological  findings (summarize): none   Lab Results  Component Value Date   WBC 6.2  07/01/2024   HGB 14.3 07/01/2024   HCT 42.2 07/01/2024   PLT 259.0 07/01/2024   GLUCOSE 83 07/01/2024   CHOL 188 07/01/2024   TRIG 89.0 07/01/2024   HDL 51.70 07/01/2024   LDLCALC 118 (H) 07/01/2024   ALT 12 07/01/2024   AST 13 07/01/2024   NA 141 07/01/2024   K 4.1 07/01/2024   CL 102 07/01/2024   CREATININE 0.79 07/01/2024   BUN 12 07/01/2024   CO2 28 07/01/2024   TSH 1.44 07/01/2024   HGBA1C 5.2 07/01/2024   Assessment/Plan:  Jaleiah Asay is a 53 y.o. White or Caucasian [1] female with  has a past medical history of Agatston coronary artery calcium score less than 100, Allergic rhinitis, Allergy , Arthritis, Asthma, Chronic insomnia, Depression (1991), Headache(784.0), adenomatous polyp of colon (05/13/2021), Hypothyroidism (07/09/2015), Post traumatic stress disorder, and Vitamin D  deficiency (07/09/2015).  Encounter for well adult exam with abnormal findings Age and sex appropriate education and counseling updated with regular exercise and diet Referrals for preventative services - none needed Immunizations addressed - for flu shot today, for prevnar 20 at pharmacy Smoking counseling  - none needed Evidence for depression or other mood disorder - none significant Most recent labs reviewed. I have personally reviewed and have noted: 1) the patient's medical and social history 2) The patient's current medications and supplements 3) The patient's height, weight, and BMI have been recorded in the chart   HLD (hyperlipidemia) Lab Results  Component Value Date   LDLCALC 118 (H) 07/01/2024   Uncontrolled, pt for lower chol diet, declines statin or other today   Vitamin D  deficiency Last vitamin D  Lab Results  Component Value Date   VD25OH 33.38 07/01/2024   Low, to start oral replacement   RLS (restless legs syndrome) With mild recent worsening, ok for gabapentin  300 mg - 1-2 at bedtime prn, and should help with getting to sleep  Followup: Return in about 1 year  (around 07/16/2025).  Lynwood Rush, MD 07/16/2024 8:26 PM [facility]

## 2024-07-16 NOTE — Assessment & Plan Note (Signed)
 Age and sex appropriate education and counseling updated with regular exercise and diet Referrals for preventative services - none needed Immunizations addressed - for flu shot today, for prevnar 20 at pharmacy Smoking counseling  - none needed Evidence for depression or other mood disorder - none significant Most recent labs reviewed. I have personally reviewed and have noted: 1) the patient's medical and social history 2) The patient's current medications and supplements 3) The patient's height, weight, and BMI have been recorded in the chart

## 2024-07-16 NOTE — Assessment & Plan Note (Signed)
 Lab Results  Component Value Date   LDLCALC 118 (H) 07/01/2024   Uncontrolled, pt for lower chol diet, declines statin or other today

## 2024-07-16 NOTE — Assessment & Plan Note (Addendum)
 With mild recent worsening, ok for gabapentin  300 mg - 1-2 at bedtime prn, and should help with getting to sleep

## 2024-08-05 ENCOUNTER — Encounter: Payer: Self-pay | Admitting: Orthopaedic Surgery

## 2024-08-05 ENCOUNTER — Other Ambulatory Visit: Payer: Self-pay

## 2024-08-05 ENCOUNTER — Ambulatory Visit: Admitting: Orthopaedic Surgery

## 2024-08-05 DIAGNOSIS — Z96652 Presence of left artificial knee joint: Secondary | ICD-10-CM

## 2024-08-05 NOTE — Progress Notes (Signed)
 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  We did look at her preoperative x-rays and her x-rays today.  The knee itself looks great.  She has been doing well until about 2 weeks ago she sat in a low sitting chair and felt a pulling or popping sensation and she points to the medial aspect of her knee as a source of her pain.  On my exam today her range of motion is entirely full.  The knee feels ligamentously stable and I did not feel anything worrisome on exam.  X-rays of the left knee shows a well-seated and well aligned total knee arthroplasty with no complicating features.  She may have felt some type of scar tissue or something else to give way.  Even if she has a partial injury to the [facility] there is nothing that we would do about this other than time.  I gave her reassurance that the knee range of motion is exceeded our expectations for just 3 months out from surgery and that the knee feels stable on my exam and the x-rays look good.  If things worsen she will let us  know.  If things keep going well the next time we need to see her now for 6 months with a standing AP and lateral of her left knee at that visit.

## 2024-08-21 ENCOUNTER — Ambulatory Visit: Admitting: Family Medicine

## 2024-08-21 NOTE — Progress Notes (Deleted)
   [name], PhD, LAT, ATC acting as a scribe for Artist Lloyd, MD.  Laurie Solomon is a 53 y.o. female who presents to [facility] today for L knee pain. Pt was last seen by Dr. Lloyd on 03/07/24 and completed the Orthovisc series, 3/3, L knee.  Pt underwent a L TKR on 6/27 w/ Dr. Vernetta. At f/u visit, pt c/o increased L knee pain after sitting in a low chair.  Today, pt reports ***  Pertinent review of systems: ***  Relevant historical information: ***   Exam:  There were no vitals taken for this visit. General: Well Developed, well nourished, and in no acute distress.   MSK: ***    Lab and Radiology Results No results found for this or any previous visit (from the past 72 hours). No results found.     Assessment and Plan: 53 y.o. female with ***   PDMP not reviewed this encounter. No orders of the defined types were placed in this encounter.  No orders of the defined types were placed in this encounter.    Discussed warning signs or symptoms. Please see discharge instructions. Patient expresses understanding.   ***

## 2024-08-27 ENCOUNTER — Ambulatory Visit: Admitting: Family Medicine

## 2024-08-27 NOTE — Progress Notes (Deleted)
   [name], PhD, LAT, ATC acting as a scribe for Artist Lloyd, MD.  Kayde Atkerson is a 53 y.o. female who presents to [facility] at [facility] today for L knee pain. Pt was last seen by Dr. Lloyd on 03/07/24 and completed the Orthovisc series, 3/3, L knee.  Pt underwent a L TKR on 6/27 w/ Dr. Vernetta. At f/u visit, pt c/o increased L knee pain after sitting in a low chair.  Today, pt reports ***  Pertinent review of systems: ***  Relevant historical information: ***   Exam:  There were no vitals taken for this visit. General: Well Developed, well nourished, and in no acute distress.   MSK: ***    Lab and Radiology Results No results found for this or any previous visit (from the past 72 hours). No results found.     Assessment and Plan: 53 y.o. female with ***   PDMP not reviewed this encounter. No orders of the defined types were placed in this encounter.  No orders of the defined types were placed in this encounter.    Discussed warning signs or symptoms. Please see discharge instructions. Patient expresses understanding.   ***

## 2024-09-03 ENCOUNTER — Encounter: Payer: Self-pay | Admitting: Family Medicine

## 2024-09-03 ENCOUNTER — Ambulatory Visit: Admitting: Family Medicine

## 2024-09-03 NOTE — Progress Notes (Deleted)
   Laurie Ileana Collet, PhD, LAT, ATC acting as a scribe for Artist Lloyd, MD.  Laurie Solomon is a 53 y.o. female who presents to [facility] at [facility] today for L knee pain. Pt was last seen by Dr. Lloyd on 03/07/24 and completed the Orthovisc series, 3/3, L knee.  Pt underwent a L TKR on 6/27 w/ Dr. Vernetta. At f/u visit, pt c/o increased L knee pain after sitting in a low chair.  Today, pt reports ***  Pertinent review of systems: ***  Relevant historical information: ***   Exam:  There were no vitals taken for this visit. General: Well Developed, well nourished, and in no acute distress.   MSK: ***    Lab and Radiology Results No results found for this or any previous visit (from the past 72 hours). No results found.     Assessment and Plan: 53 y.o. female with ***   PDMP not reviewed this encounter. No orders of the defined types were placed in this encounter.  No orders of the defined types were placed in this encounter.    Discussed warning signs or symptoms. Please see discharge instructions. Patient expresses understanding.   ***

## 2024-09-09 ENCOUNTER — Encounter: Payer: Self-pay | Admitting: Radiology

## 2024-09-11 ENCOUNTER — Encounter: Payer: Self-pay | Admitting: Family Medicine

## 2024-09-11 ENCOUNTER — Ambulatory Visit: Admitting: Family Medicine

## 2024-09-11 ENCOUNTER — Other Ambulatory Visit: Payer: Self-pay

## 2024-09-11 VITALS — BP 102/82 | HR 78 | Ht 67.5 in | Wt 154.0 lb

## 2024-09-11 DIAGNOSIS — Z96652 Presence of left artificial knee joint: Secondary | ICD-10-CM

## 2024-09-11 DIAGNOSIS — M25562 Pain in left knee: Secondary | ICD-10-CM | POA: Diagnosis not present

## 2024-09-11 DIAGNOSIS — G8929 Other chronic pain: Secondary | ICD-10-CM

## 2024-09-11 NOTE — Progress Notes (Unsigned)
   I, Leotis Batter, CMA acting as a scribe for Artist Lloyd, MD.  Laurie Solomon is a 53 y.o. female who presents to [facility] at [facility] today for L knee pain. Pt was last seen by Dr. Lloyd on 03/07/24 and completed the Orthovisc series, 3/3, L knee.  Pt underwent a L TKR on 6/27 w/ Dr. Vernetta. At f/u visit, pt c/o increased L knee pain after sitting in a low chair.  Today, pt reports that the knee was doing great until sat down quickly in a really low chair and felt/heard a crunch, this happened 4-5 weeks ago. There was some swelling at time of injury. Swelling has improved some. Pain has improved some since onset.   Pertinent review of systems: No fevers or chills  Relevant historical information: History of left total knee replacement   Exam:  BP 102/82   Pulse 78   Ht 5' 7.5 (1.715 m)   Wt 154 lb (69.9 kg)   SpO2 98%   BMI 23.76 kg/m  General: Well Developed, well nourished, and in no acute distress.   MSK: Left knee mature scar anterior knee minimal swelling.  Normal motion stable ligamentous exam intact strength.    Lab and Radiology Results  Diagnostic Limited MSK Ultrasound of: Left knee Some soft tissue calcification present at medial femoral condyle at area of resolving pain.  Normal motion no impingement visible. Impression: Possible soft tissue calcification      Assessment and Plan: 53 y.o. female with right medial knee pain occurring after sitting down.  She had a knee replacement in June of this year.  She has had significant overall improvement.  She did have some soft tissue impingement and I think broke up some scar tissue which is the source of the pain.  That is improving.  She does have a little bit of calcification on ultrasound today at the area of pain.  Plan for watchful waiting continue activity as tolerated.   PDMP not reviewed this encounter. Orders Placed This Encounter  Procedures   US  LIMITED JOINT SPACE STRUCTURES  LOW LEFT(NO LINKED CHARGES)    Reason for Exam (SYMPTOM  OR DIAGNOSIS REQUIRED):   left knee pain    Preferred imaging location?:   [facility]   No orders of the defined types were placed in this encounter.    Discussed warning signs or symptoms. Please see discharge instructions. Patient expresses understanding.   The above documentation has been reviewed and is accurate and complete Artist Lloyd, M.D.

## 2024-09-11 NOTE — Patient Instructions (Addendum)
 Thank you for coming in today.   Continue to advance activity, as tolerated.  Consider using a knee compression sleeve  Check back with me as needed

## 2024-11-04 ENCOUNTER — Ambulatory Visit: Admitting: Orthopaedic Surgery

## 2024-11-13 ENCOUNTER — Ambulatory Visit: Admitting: Orthopaedic Surgery
